# Patient Record
Sex: Male | Born: 1935
Health system: Southern US, Community
[De-identification: ages and names within clinical notes are randomized; demographics above are authoritative.]

## PROBLEM LIST (undated history)

## (undated) DIAGNOSIS — N400 Enlarged prostate without lower urinary tract symptoms: Secondary | ICD-10-CM

## (undated) DIAGNOSIS — J00 Acute nasopharyngitis [common cold]: Secondary | ICD-10-CM

## (undated) DIAGNOSIS — I10 Essential (primary) hypertension: Secondary | ICD-10-CM

## (undated) DIAGNOSIS — M1990 Unspecified osteoarthritis, unspecified site: Secondary | ICD-10-CM

## (undated) HISTORY — PX: OTHER SURGICAL HISTORY: SHX169

---

## 1997-08-16 ENCOUNTER — Ambulatory Visit (HOSPITAL_COMMUNITY): Admission: RE | Admit: 1997-08-16 | Discharge: 1997-08-16 | Payer: Self-pay | Admitting: Family Medicine

## 2001-05-01 ENCOUNTER — Encounter: Admission: RE | Admit: 2001-05-01 | Discharge: 2001-05-01 | Payer: Self-pay | Admitting: Family Medicine

## 2001-05-01 ENCOUNTER — Encounter: Payer: Self-pay | Admitting: Family Medicine

## 2001-11-14 ENCOUNTER — Emergency Department (HOSPITAL_COMMUNITY): Admission: EM | Admit: 2001-11-14 | Discharge: 2001-11-14 | Payer: Self-pay | Admitting: Emergency Medicine

## 2003-03-14 ENCOUNTER — Ambulatory Visit (HOSPITAL_COMMUNITY): Admission: RE | Admit: 2003-03-14 | Discharge: 2003-03-14 | Payer: Self-pay | Admitting: Family Medicine

## 2003-03-18 ENCOUNTER — Ambulatory Visit (HOSPITAL_COMMUNITY): Admission: RE | Admit: 2003-03-18 | Discharge: 2003-03-18 | Payer: Self-pay | Admitting: Family Medicine

## 2003-07-05 ENCOUNTER — Encounter: Admission: RE | Admit: 2003-07-05 | Discharge: 2003-07-05 | Payer: Self-pay | Admitting: Family Medicine

## 2004-11-26 ENCOUNTER — Ambulatory Visit: Payer: Self-pay | Admitting: Gastroenterology

## 2004-12-07 ENCOUNTER — Ambulatory Visit: Payer: Self-pay | Admitting: Gastroenterology

## 2008-05-16 ENCOUNTER — Emergency Department (HOSPITAL_COMMUNITY): Admission: EM | Admit: 2008-05-16 | Discharge: 2008-05-16 | Payer: Self-pay | Admitting: Emergency Medicine

## 2008-05-17 ENCOUNTER — Ambulatory Visit (HOSPITAL_COMMUNITY): Admission: RE | Admit: 2008-05-17 | Discharge: 2008-05-17 | Payer: Self-pay | Admitting: Family Medicine

## 2009-05-04 ENCOUNTER — Ambulatory Visit (HOSPITAL_COMMUNITY): Admission: RE | Admit: 2009-05-04 | Discharge: 2009-05-04 | Payer: Self-pay | Admitting: Family Medicine

## 2009-06-21 ENCOUNTER — Emergency Department (HOSPITAL_COMMUNITY): Admission: EM | Admit: 2009-06-21 | Discharge: 2009-06-21 | Payer: Self-pay | Admitting: Emergency Medicine

## 2009-10-24 ENCOUNTER — Encounter (INDEPENDENT_AMBULATORY_CARE_PROVIDER_SITE_OTHER): Payer: Self-pay | Admitting: *Deleted

## 2009-12-24 ENCOUNTER — Emergency Department (HOSPITAL_COMMUNITY): Admission: EM | Admit: 2009-12-24 | Discharge: 2009-12-24 | Payer: Self-pay | Admitting: Emergency Medicine

## 2010-05-17 NOTE — Letter (Signed)
Summary: Colonoscopy-Changed to Office Visit Letter  [facility]  [address] [place], [place] [address]   Phone: [phone]  Fax: [phone]      October 24, 2009 MRN: [id number]   [facility] [address] [place], [place]  [address]   Dear Mr. Zawistowski,   According to our records, it is time for you to schedule a Colonoscopy. However, after reviewing your medical record, I feel that an office visit in the month of Sept 2011, would be most appropriate to more completely evaluate you and determine your need for a repeat procedure.  Please call [phone] (option #2) at your convenience to schedule an office visit. If you have any questions, concerns, or feel that this letter is in error, we would appreciate your call.   Sincerely,  Barbette Hair. Arlyce Dice, M.D.  [facility] [phone]

## 2010-06-28 LAB — URINALYSIS, ROUTINE W REFLEX MICROSCOPIC
Bilirubin Urine: NEGATIVE
Ketones, ur: NEGATIVE mg/dL
Nitrite: NEGATIVE
Protein, ur: NEGATIVE mg/dL
Specific Gravity, Urine: 1.009 (ref 1.005–1.030)
Urobilinogen, UA: 0.2 mg/dL (ref 0.0–1.0)

## 2010-06-28 LAB — POCT I-STAT, CHEM 8
BUN: 12 mg/dL (ref 6–23)
Calcium, Ion: 1.09 mmol/L — ABNORMAL LOW (ref 1.12–1.32)
Chloride: 103 mEq/L (ref 96–112)
Creatinine, Ser: 0.7 mg/dL (ref 0.4–1.5)
Glucose, Bld: 156 mg/dL — ABNORMAL HIGH (ref 70–99)
Potassium: 4.1 mEq/L (ref 3.5–5.1)

## 2010-06-28 LAB — URINE MICROSCOPIC-ADD ON

## 2010-07-01 LAB — RENAL FUNCTION PANEL
BUN: 11 mg/dL (ref 6–23)
Chloride: 104 mEq/L (ref 96–112)
Glucose, Bld: 119 mg/dL — ABNORMAL HIGH (ref 70–99)
Phosphorus: 2.9 mg/dL (ref 2.3–4.6)
Potassium: 3.6 mEq/L (ref 3.5–5.1)
Sodium: 139 mEq/L (ref 135–145)

## 2010-07-09 ENCOUNTER — Emergency Department (HOSPITAL_COMMUNITY)
Admission: EM | Admit: 2010-07-09 | Discharge: 2010-07-09 | Disposition: A | Discharge status: Home / Self Care | Payer: 59 | Attending: Emergency Medicine | Admitting: Emergency Medicine

## 2010-07-09 DIAGNOSIS — E785 Hyperlipidemia, unspecified: Secondary | ICD-10-CM | POA: Insufficient documentation

## 2010-07-09 DIAGNOSIS — I10 Essential (primary) hypertension: Secondary | ICD-10-CM | POA: Insufficient documentation

## 2010-07-09 DIAGNOSIS — K5900 Constipation, unspecified: Secondary | ICD-10-CM | POA: Insufficient documentation

## 2010-07-09 LAB — URINE CULTURE

## 2010-07-09 LAB — POCT I-STAT, CHEM 8
BUN: 17 mg/dL (ref 6–23)
Chloride: 102 mEq/L (ref 96–112)
Creatinine, Ser: 0.7 mg/dL (ref 0.4–1.5)
Sodium: 140 mEq/L (ref 135–145)

## 2010-07-09 LAB — URINE MICROSCOPIC-ADD ON

## 2010-07-09 LAB — URINALYSIS, ROUTINE W REFLEX MICROSCOPIC
Glucose, UA: NEGATIVE mg/dL
Leukocytes, UA: NEGATIVE
Protein, ur: NEGATIVE mg/dL
Specific Gravity, Urine: 1.009 (ref 1.005–1.030)
Urobilinogen, UA: 0.2 mg/dL (ref 0.0–1.0)

## 2010-08-20 ENCOUNTER — Emergency Department (HOSPITAL_COMMUNITY): Payer: 59

## 2010-08-20 ENCOUNTER — Emergency Department (HOSPITAL_COMMUNITY)
Admission: EM | Admit: 2010-08-20 | Discharge: 2010-08-20 | Disposition: A | Discharge status: Home / Self Care | Payer: 59 | Attending: Emergency Medicine | Admitting: Emergency Medicine

## 2010-08-20 DIAGNOSIS — S62309A Unspecified fracture of unspecified metacarpal bone, initial encounter for closed fracture: Secondary | ICD-10-CM | POA: Insufficient documentation

## 2010-08-20 DIAGNOSIS — W2203XA Walked into furniture, initial encounter: Secondary | ICD-10-CM | POA: Insufficient documentation

## 2010-08-20 DIAGNOSIS — I10 Essential (primary) hypertension: Secondary | ICD-10-CM | POA: Insufficient documentation

## 2010-08-20 DIAGNOSIS — E785 Hyperlipidemia, unspecified: Secondary | ICD-10-CM | POA: Insufficient documentation

## 2010-08-20 DIAGNOSIS — M109 Gout, unspecified: Secondary | ICD-10-CM | POA: Insufficient documentation

## 2011-07-26 ENCOUNTER — Other Ambulatory Visit: Payer: Self-pay | Admitting: Family Medicine

## 2011-07-26 ENCOUNTER — Ambulatory Visit
Admission: RE | Admit: 2011-07-26 | Discharge: 2011-07-26 | Disposition: A | Discharge status: Home / Self Care | Payer: 59 | Source: Ambulatory Visit | Attending: Family Medicine | Admitting: Family Medicine

## 2011-08-23 ENCOUNTER — Other Ambulatory Visit: Payer: Self-pay | Admitting: Family Medicine

## 2011-08-23 ENCOUNTER — Ambulatory Visit
Admission: RE | Admit: 2011-08-23 | Discharge: 2011-08-23 | Disposition: A | Discharge status: Home / Self Care | Payer: 59 | Source: Ambulatory Visit | Attending: Family Medicine | Admitting: Family Medicine

## 2011-08-23 DIAGNOSIS — J209 Acute bronchitis, unspecified: Secondary | ICD-10-CM

## 2011-11-09 ENCOUNTER — Encounter (HOSPITAL_COMMUNITY): Payer: Self-pay | Admitting: Emergency Medicine

## 2011-11-09 ENCOUNTER — Emergency Department (HOSPITAL_COMMUNITY)
Admission: EM | Admit: 2011-11-09 | Discharge: 2011-11-09 | Disposition: A | Discharge status: Home / Self Care | Payer: 59 | Attending: Emergency Medicine | Admitting: Emergency Medicine

## 2011-11-09 DIAGNOSIS — R339 Retention of urine, unspecified: Secondary | ICD-10-CM | POA: Insufficient documentation

## 2011-11-09 DIAGNOSIS — I10 Essential (primary) hypertension: Secondary | ICD-10-CM | POA: Insufficient documentation

## 2011-11-09 HISTORY — DX: Benign prostatic hyperplasia without lower urinary tract symptoms: N40.0

## 2011-11-09 HISTORY — DX: Essential (primary) hypertension: I10

## 2011-11-09 LAB — URINALYSIS, ROUTINE W REFLEX MICROSCOPIC
Glucose, UA: NEGATIVE mg/dL
Leukocytes, UA: NEGATIVE
pH: 6 (ref 5.0–8.0)

## 2011-11-09 NOTE — ED Provider Notes (Signed)
Medical screening examination/treatment/procedure(s) were conducted as a shared visit with non-physician practitioner(s) and myself.  I personally evaluated the patient during the encounter   Cyndra Numbers, MD 11/09/11 1115

## 2011-11-09 NOTE — ED Provider Notes (Signed)
History     CSN: [id number]  Arrival date & time 11/09/11  0730   First MD Initiated Contact with Patient 11/09/11 [phone]      Chief Complaint  Patient presents with  . Urinary Retention    (Consider location/radiation/quality/duration/timing/severity/associated sxs/prior treatment) HPI Comments: Patient with enlarged prostate reports he was feeling fine until this morning when he woke up and "couldn't make water."  States he has been having stinging and burning with urination off and on for the past few weeks.  Denies fevers, abdominal pain, back pain, N/V/D.  Pt sees Dr Brunilda Payor for his prostate.   States that since foley has been placed in ED he feels "like new" again.  Denies any discomfort.    The history is provided by the patient.    Past Medical History  Diagnosis Date  . Enlarged prostate   . Hypertension     History reviewed. No pertinent past surgical history.  History reviewed. No pertinent family history.  History  Substance Use Topics  . Smoking status: Never Smoker   . Smokeless tobacco: Not on file  . Alcohol Use: Yes      Review of Systems  Constitutional: Negative for fever and chills.  Respiratory: Negative for cough and shortness of breath.   Cardiovascular: Negative for chest pain.  Gastrointestinal: Negative for nausea, vomiting, abdominal pain and diarrhea.  Genitourinary: Positive for dysuria.    Allergies  Review of patient's allergies indicates no known allergies.  Home Medications  No current outpatient prescriptions on file.  BP 204/62  Pulse 54  Temp 98.6 F (37 C) (Oral)  Resp 18  SpO2 100%  Physical Exam  Nursing note and vitals reviewed. Constitutional: He appears well-developed and well-nourished. No distress.  HENT:  Head: Normocephalic and atraumatic.  Neck: Neck supple.  Cardiovascular: Normal rate and regular rhythm.   Pulmonary/Chest: Effort normal and breath sounds normal. No respiratory distress. He has no wheezes.  He has no rales.  Abdominal: Soft. He exhibits no distension and no mass. There is no tenderness. There is no rebound and no guarding.  Neurological: He is alert. He exhibits normal muscle tone.  Skin: He is not diaphoretic.  Foley bag in place, has drained 350cc  ED Course  Procedures (including critical care time)  Labs Reviewed  URINALYSIS, ROUTINE W REFLEX MICROSCOPIC - Abnormal; Notable for the following:    APPearance CLOUDY (*)     Hgb urine dipstick MODERATE (*)     Protein, ur 30 (*)     All other components within normal limits  URINE MICROSCOPIC-ADD ON  URINE CULTURE   No results found.  8:28 AM Discussed patient with Dr Alto Denver who will also see the patient.    1. Urinary retention       MDM  Elderly patient with hx prostatic hypertrophy presenting with urinary retention, relieved with foley catheter.  Pt has had this before, sees Dr Brunilda Payor for his prostate.  No e/o UTI on UA, sent for culture.  Pt advised to follow closely with Dr Brunilda Payor.  Will be d/c home with foley catheter in place.  Pt is already on flomax.  Discussed all results with patient.  Pt given return precautions.  Pt verbalizes understanding and agrees with plan.           [place], [place] 11/09/11 [phone]

## 2011-11-09 NOTE — ED Notes (Signed)
Pt c/o urinary retention, states he was able to void a little this am but was unable to empty his bladder.

## 2011-11-10 LAB — URINE CULTURE
Colony Count: NO GROWTH
Culture: NO GROWTH

## 2011-12-08 ENCOUNTER — Encounter (HOSPITAL_COMMUNITY): Payer: Self-pay | Admitting: *Deleted

## 2011-12-08 ENCOUNTER — Emergency Department (HOSPITAL_COMMUNITY)
Admission: EM | Admit: 2011-12-08 | Discharge: 2011-12-08 | Disposition: A | Discharge status: Home / Self Care | Payer: 59 | Attending: Emergency Medicine | Admitting: Emergency Medicine

## 2011-12-08 DIAGNOSIS — R339 Retention of urine, unspecified: Secondary | ICD-10-CM | POA: Insufficient documentation

## 2011-12-08 DIAGNOSIS — I10 Essential (primary) hypertension: Secondary | ICD-10-CM | POA: Insufficient documentation

## 2011-12-08 DIAGNOSIS — N390 Urinary tract infection, site not specified: Secondary | ICD-10-CM

## 2011-12-08 LAB — URINALYSIS, ROUTINE W REFLEX MICROSCOPIC
Bilirubin Urine: NEGATIVE
Glucose, UA: NEGATIVE mg/dL
Nitrite: POSITIVE — AB
Specific Gravity, Urine: 1.015 (ref 1.005–1.030)
pH: 6.5 (ref 5.0–8.0)

## 2011-12-08 LAB — URINE MICROSCOPIC-ADD ON

## 2011-12-08 MED ORDER — SULFAMETHOXAZOLE-TMP DS 800-160 MG PO TABS
1.0000 | ORAL_TABLET | Freq: Once | ORAL | Status: AC
  Administered 2011-12-08: 1 via ORAL
  Filled 2011-12-08: qty 1

## 2011-12-08 MED ORDER — SULFAMETHOXAZOLE-TRIMETHOPRIM 800-160 MG PO TABS: 1.0000 | ORAL_TABLET | Freq: Two times a day (BID) | ORAL | Status: AC

## 2011-12-08 NOTE — ED Notes (Signed)
Pt reports abdominal pain. Reports he is unable to urinate.

## 2011-12-08 NOTE — ED Notes (Signed)
Leg bag attached prior to Dc. Bag intact. Instructed pt on how to care for bag. Pt reports having had bag before and knows how to care for.

## 2011-12-08 NOTE — ED Notes (Signed)
Patient discharged to home. Dc instructions given with prescription x 1 for antibiotic. No concerns voiced. Pt voices "feeling better already" prior to leaving. Left unit ambulating to checkout. Left in good condition.

## 2011-12-08 NOTE — ED Provider Notes (Signed)
History     CSN: [id number]  Arrival date & time 12/08/11  4098   First MD Initiated Contact with Patient 12/08/11 [phone]      Chief Complaint  Patient presents with  . Urinary Retention    (Consider location/radiation/quality/duration/timing/severity/associated sxs/prior treatment) HPI Comments: Mr. Todorov states that he has not been able to empty his bladder since yesterday morning.  He c/o "dribbling" urine and feeling pressure and distention of his bladder.  He denies any fevers, chills, dysuria, hematuria, or pyuria.  He denies any trauma or hx of prostate cancer.  He has been recently treated for urinary retention with a foley catheter that was removed by his primary physician.  The history is provided by the patient. No language interpreter was used.    Past Medical History  Diagnosis Date  . Enlarged prostate   . Hypertension     History reviewed. No pertinent past surgical history.  History reviewed. No pertinent family history.  History  Substance Use Topics  . Smoking status: Never Smoker   . Smokeless tobacco: Not on file  . Alcohol Use: Yes      Review of Systems  Constitutional: Negative for fever, chills, diaphoresis and fatigue.  HENT: Negative.   Eyes: Negative.   Respiratory: Negative.   Cardiovascular: Negative.   Gastrointestinal: Positive for abdominal distention. Negative for nausea, vomiting, diarrhea and constipation.  Genitourinary: Positive for urgency, frequency and difficulty urinating. Negative for dysuria, hematuria, flank pain, penile swelling, enuresis and penile pain.  Musculoskeletal: Negative.   Skin: Negative.   Neurological: Negative.   Psychiatric/Behavioral: Negative.     Allergies  Review of patient's allergies indicates no known allergies.  Home Medications   Current Outpatient Rx  Name Route Sig Dispense Refill  . METOPROLOL SUCCINATE ER 50 MG PO TB24 Oral Take 50 mg by mouth daily. Take with or immediately following a  meal.    . ROSUVASTATIN CALCIUM 20 MG PO TABS Oral Take 20 mg by mouth daily.    Marland Kitchen TAMSULOSIN HCL 0.4 MG PO CAPS Oral Take 0.4 mg by mouth daily.    Marland Kitchen VALSARTAN 160 MG PO TABS Oral Take 160 mg by mouth daily.      BP 131/71  Pulse 53  Temp 98.2 F (36.8 C) (Oral)  Resp 30  Ht 5\' 5"  (1.651 m)  Wt 160 lb (72.576 kg)  BMI 26.63 kg/m2  SpO2 100%  Physical Exam  Nursing note and vitals reviewed. Constitutional: He is oriented to person, place, and time. He appears well-nourished. No distress.  HENT:  Head: Normocephalic and atraumatic.  Right Ear: External ear normal.  Left Ear: External ear normal.  Nose: Nose normal.  Mouth/Throat: Oropharynx is clear and moist. No oropharyngeal exudate.  Eyes: Conjunctivae and EOM are normal. Pupils are equal, round, and reactive to light. Left eye exhibits no discharge. No scleral icterus.  Neck: Normal range of motion. Neck supple. No JVD present. No tracheal deviation present. No thyromegaly present.  Cardiovascular: Normal rate, regular rhythm and intact distal pulses.  Exam reveals no gallop and no friction rub.   Murmur heard. Pulmonary/Chest: Effort normal and breath sounds normal. No respiratory distress. He has no wheezes. He has no rales. He exhibits no tenderness.  Abdominal: Soft. Bowel sounds are normal. He exhibits distension. He exhibits no mass. There is tenderness. There is no rebound and no guarding.  Musculoskeletal: Normal range of motion. He exhibits no edema.  Lymphadenopathy:    He has no cervical adenopathy.  Neurological: He is alert and oriented to person, place, and time.  Skin: Skin is warm and dry. No rash noted. He is not diaphoretic. No erythema. No pallor.    ED Course  Procedures (including critical care time)  Labs Reviewed - No data to display No results found.   No diagnosis found.    MDM  Pt presents for evaluation of urinary retention.  He states he was seen for the same condition about 2 wks ago.   Denies any other issues.  Plan place foley and obtain U/A.  Will refer back to his PMD as well as to a local urologist if no other acute issues encountered.  1100.  Pt appears comfortable s/p placement of folley.  Not evidence of a UTI on the U/A.  Urine culture pending.  Plan start bactrim, place a leg bag.  Will D/C home to f/u with his PMD and will refer to a urologist.      Tobin Chad, MD 12/08/11 1110

## 2011-12-10 LAB — URINE CULTURE: Colony Count: >=100000

## 2011-12-11 NOTE — ED Notes (Signed)
+   urine  Patient treated appropriately -sensitive to same-chart appended per protocol MD.  

## 2011-12-17 ENCOUNTER — Encounter: Payer: Self-pay | Admitting: Gastroenterology

## 2012-01-02 ENCOUNTER — Encounter (HOSPITAL_COMMUNITY): Payer: Self-pay | Admitting: *Deleted

## 2012-01-02 ENCOUNTER — Emergency Department (HOSPITAL_COMMUNITY)
Admission: EM | Admit: 2012-01-02 | Discharge: 2012-01-02 | Disposition: A | Discharge status: Home / Self Care | Payer: 59 | Attending: Neurological Surgery | Admitting: Neurological Surgery

## 2012-01-02 DIAGNOSIS — T839XXA Unspecified complication of genitourinary prosthetic device, implant and graft, initial encounter: Secondary | ICD-10-CM

## 2012-01-02 DIAGNOSIS — T8389XA Other specified complication of genitourinary prosthetic devices, implants and grafts, initial encounter: Secondary | ICD-10-CM | POA: Insufficient documentation

## 2012-01-02 DIAGNOSIS — Y846 Urinary catheterization as the cause of abnormal reaction of the patient, or of later complication, without mention of misadventure at the time of the procedure: Secondary | ICD-10-CM | POA: Insufficient documentation

## 2012-01-02 NOTE — ED Notes (Signed)
Pt reported catheter bag was changed in triage. Catheter bag intact, new on assessment. No leaking. Pt ambulated to bathroom to have BM.

## 2012-01-02 NOTE — ED Provider Notes (Signed)
Medical screening examination/treatment/procedure(s) were performed by non-physician practitioner and as supervising physician I was immediately available for consultation/collaboration.  Tobin Chad, MD 01/02/12 [phone]

## 2012-01-02 NOTE — ED Provider Notes (Signed)
History     CSN: [id number]  Arrival date & time 01/02/12  0620   First MD Initiated Contact with Patient 01/02/12 [phone]      Chief Complaint  Patient presents with  . catheter bag leaking     (Consider location/radiation/quality/duration/timing/severity/associated sxs/prior treatment) HPI  78 y.o. y/o male INAD c/o leaking foley cath leg bag onset this AM. Pt denies any other  Symptoms or pain.   Past Medical History  Diagnosis Date  . Enlarged prostate   . Hypertension     History reviewed. No pertinent past surgical history.  No family history on file.  History  Substance Use Topics  . Smoking status: Never Smoker   . Smokeless tobacco: Not on file  . Alcohol Use: Yes      Review of Systems  Constitutional: Negative for fever.  Respiratory: Negative for shortness of breath.   Cardiovascular: Negative for chest pain.  Gastrointestinal: Negative for nausea, vomiting, abdominal pain and diarrhea.  Genitourinary: Negative for hematuria, scrotal swelling and testicular pain.  All other systems reviewed and are negative.    Allergies  Review of patient's allergies indicates no known allergies.  Home Medications   Current Outpatient Rx  Name Route Sig Dispense Refill  . METOPROLOL SUCCINATE ER 50 MG PO TB24 Oral Take 50 mg by mouth daily. Take with or immediately following a meal.    . ROSUVASTATIN CALCIUM 20 MG PO TABS Oral Take 20 mg by mouth daily.    Marland Kitchen TAMSULOSIN HCL 0.4 MG PO CAPS Oral Take 0.4 mg by mouth daily.    Marland Kitchen VALSARTAN 160 MG PO TABS Oral Take 160 mg by mouth daily.      BP 209/70  Pulse 62  Temp 98.1 F (36.7 C) (Oral)  Resp 18  SpO2 100%  Physical Exam  Nursing note and vitals reviewed. Constitutional: He is oriented to person, place, and time. He appears well-developed and well-nourished. No distress.  HENT:  Head: Normocephalic.  Eyes: Conjunctivae normal and EOM are normal.  Cardiovascular: Normal rate.   Pulmonary/Chest: Effort  normal. No stridor.  Abdominal: Soft.  Musculoskeletal: Normal range of motion.  Neurological: He is alert and oriented to person, place, and time.  Psychiatric: He has a normal mood and affect.    ED Course  Procedures (including critical care time)  Labs Reviewed - No data to display No results found.   1. Foley catheter problem       MDM  Leg bag changed, Pt will follow with urology        Wynetta Emery, PA-C 01/02/12 [phone]

## 2012-01-02 NOTE — ED Notes (Signed)
Pt urinary leg bag leaking

## 2012-01-11 ENCOUNTER — Encounter (HOSPITAL_COMMUNITY): Payer: Self-pay | Admitting: *Deleted

## 2012-01-11 ENCOUNTER — Emergency Department (HOSPITAL_COMMUNITY)
Admission: EM | Admit: 2012-01-11 | Discharge: 2012-01-11 | Disposition: A | Discharge status: Home / Self Care | Payer: 59 | Attending: Emergency Medicine | Admitting: Emergency Medicine

## 2012-01-11 DIAGNOSIS — I10 Essential (primary) hypertension: Secondary | ICD-10-CM | POA: Insufficient documentation

## 2012-01-11 DIAGNOSIS — N390 Urinary tract infection, site not specified: Secondary | ICD-10-CM

## 2012-01-11 DIAGNOSIS — T8389XA Other specified complication of genitourinary prosthetic devices, implants and grafts, initial encounter: Secondary | ICD-10-CM | POA: Insufficient documentation

## 2012-01-11 DIAGNOSIS — Y846 Urinary catheterization as the cause of abnormal reaction of the patient, or of later complication, without mention of misadventure at the time of the procedure: Secondary | ICD-10-CM | POA: Insufficient documentation

## 2012-01-11 LAB — URINALYSIS, ROUTINE W REFLEX MICROSCOPIC
Bilirubin Urine: NEGATIVE
Ketones, ur: NEGATIVE mg/dL
Nitrite: POSITIVE — AB
Protein, ur: 30 mg/dL — AB
pH: 7 (ref 5.0–8.0)

## 2012-01-11 LAB — URINE MICROSCOPIC-ADD ON

## 2012-01-11 MED ORDER — CIPROFLOXACIN HCL 500 MG PO TABS
500.0000 mg | ORAL_TABLET | Freq: Once | ORAL | Status: AC
  Administered 2012-01-11: 500 mg via ORAL
  Filled 2012-01-11: qty 1

## 2012-01-11 MED ORDER — METOPROLOL TARTRATE 25 MG PO TABS
50.0000 mg | ORAL_TABLET | Freq: Once | ORAL | Status: AC
  Administered 2012-01-11: 50 mg via ORAL
  Filled 2012-01-11: qty 2

## 2012-01-11 MED ORDER — CIPROFLOXACIN HCL 500 MG PO TABS: 500.0000 mg | ORAL_TABLET | Freq: Two times a day (BID) | ORAL | Status: DC

## 2012-01-11 NOTE — ED Notes (Signed)
Pt sts he has catheter placed 3 weeks ago due to enlarged prostate. Sts it began leaking around noon today. Strong urine odor noted. Sts he has appointment to get catheter changed next week. Sts he believes it is leaking from the bottom.

## 2012-01-11 NOTE — ED Notes (Signed)
Patient given discharge instructions, information, prescriptions, and diet order. Patient states that they adequately understand discharge information given and to return to ED if symptoms return or worsen.     

## 2012-01-11 NOTE — ED Provider Notes (Signed)
History     CSN: [id number]  Arrival date & time 01/11/12  1746   First MD Initiated Contact with Patient 01/11/12 1905      Chief Complaint  Patient presents with  . Male GU Problem    HPI The patient presents with concerns of leakage around the bottom of his Foley catheter bag.  Symptoms began approximately 6 hours ago.  Since onset symptoms have been persistent.  He denies associated abdominal pain, dysuria, fevers, chills or any other focal complaints.  The patient has a Foley catheter due to prostate hypertrophy.  Past Medical History  Diagnosis Date  . Enlarged prostate   . Hypertension     History reviewed. No pertinent past surgical history.  No family history on file.  History  Substance Use Topics  . Smoking status: Never Smoker   . Smokeless tobacco: Not on file  . Alcohol Use: Yes      Review of Systems  All other systems reviewed and are negative.    Allergies  Review of patient's allergies indicates no known allergies.  Home Medications   Current Outpatient Rx  Name Route Sig Dispense Refill  . METOPROLOL SUCCINATE ER 50 MG PO TB24 Oral Take 50 mg by mouth daily. Take with or immediately following a meal.    . ROSUVASTATIN CALCIUM 20 MG PO TABS Oral Take 20 mg by mouth daily.    Marland Kitchen TAMSULOSIN HCL 0.4 MG PO CAPS Oral Take 0.4 mg by mouth daily.    Marland Kitchen VALSARTAN 160 MG PO TABS Oral Take 160 mg by mouth daily.      BP 204/63  Pulse 64  Temp 98.2 F (36.8 C) (Oral)  Resp 18  Ht 5\' 5"  (1.651 m)  Wt 160 lb (72.576 kg)  BMI 26.63 kg/m2  SpO2 100%  Physical Exam  Nursing note and vitals reviewed. Constitutional: He is oriented to person, place, and time. He appears well-developed. No distress.       Foul-smelling urine present  HENT:  Head: Normocephalic and atraumatic.  Eyes: Conjunctivae normal and EOM are normal.  Cardiovascular: Normal rate and regular rhythm.   Pulmonary/Chest: Effort normal. No stridor. No respiratory distress.    Abdominal: He exhibits no distension.  Genitourinary:       Uncircumcised, normal male genitalia with no scrotal swelling or pain.  About the proximal catheter there is no surrounding leakage.  There is leakage at the distal end of the catheter bag.  Musculoskeletal: He exhibits no edema.  Neurological: He is alert and oriented to person, place, and time.  Skin: Skin is warm and dry.  Psychiatric: He has a normal mood and affect.    ED Course  Procedures (including critical care time)   Labs Reviewed  URINALYSIS, ROUTINE W REFLEX MICROSCOPIC   No results found.   No diagnosis found.    MDM  This pleasant male presents with concerns of Foley catheter drainage issues.  On exam the patient denies any other associated complaints, but has malodorous urine present.  The patient is also hypertensive.  Given the patient denies any medication use today, he was provided medication, with a notable decrease in his blood pressure.  Urinalysis performed, concerning for urinary tract infection.  The patient was started on antibiotics.  Given his close followup, he was discharged in stable condition to see his urologist    Gerhard Munch, MD 01/11/12 2158

## 2012-01-11 NOTE — ED Notes (Signed)
Bed:WA21<BR> Expected date:<BR> Expected time:<BR> Means of arrival:<BR> Comments:<BR>

## 2012-02-05 ENCOUNTER — Emergency Department (HOSPITAL_COMMUNITY)
Admission: EM | Admit: 2012-02-05 | Discharge: 2012-02-05 | Disposition: A | Discharge status: Home / Self Care | Payer: 59 | Attending: Emergency Medicine | Admitting: Emergency Medicine

## 2012-02-05 ENCOUNTER — Encounter (HOSPITAL_COMMUNITY): Payer: Self-pay | Admitting: Emergency Medicine

## 2012-02-05 DIAGNOSIS — Y838 Other surgical procedures as the cause of abnormal reaction of the patient, or of later complication, without mention of misadventure at the time of the procedure: Secondary | ICD-10-CM | POA: Insufficient documentation

## 2012-02-05 DIAGNOSIS — T839XXA Unspecified complication of genitourinary prosthetic device, implant and graft, initial encounter: Secondary | ICD-10-CM

## 2012-02-05 DIAGNOSIS — I10 Essential (primary) hypertension: Secondary | ICD-10-CM | POA: Insufficient documentation

## 2012-02-05 DIAGNOSIS — T83091A Other mechanical complication of indwelling urethral catheter, initial encounter: Secondary | ICD-10-CM | POA: Insufficient documentation

## 2012-02-05 DIAGNOSIS — Z79899 Other long term (current) drug therapy: Secondary | ICD-10-CM | POA: Insufficient documentation

## 2012-02-05 NOTE — ED Provider Notes (Signed)
History     CSN: [id number]  Arrival date & time 02/05/12  0522   First MD Initiated Contact with Patient 02/05/12 0602      Chief Complaint  Patient presents with  . catheter bag leakage     Pt c/o urinary catheter leaking    (Consider location/radiation/quality/duration/timing/severity/associated sxs/prior treatment) HPI  78 year old male with history of enlarged prostate presents due to leakage from his Foley bag.  Pt wears leg bag. He request for new bag.  Denies any other sxs specifically no fever, abd pain, hematuria or rash.  Will f/u with Dr. Brunilda Payor towards the end of the month.     Past Medical History  Diagnosis Date  . Enlarged prostate   . Hypertension     History reviewed. No pertinent past surgical history.  History reviewed. No pertinent family history.  History  Substance Use Topics  . Smoking status: Never Smoker   . Smokeless tobacco: Not on file  . Alcohol Use: Yes      Review of Systems  Constitutional: Negative for fever.  Gastrointestinal: Negative for abdominal pain.  Skin: Negative for rash and wound.    Allergies  Review of patient's allergies indicates no known allergies.  Home Medications   Current Outpatient Rx  Name Route Sig Dispense Refill  . METOPROLOL SUCCINATE ER 50 MG PO TB24 Oral Take 50 mg by mouth daily. Take with or immediately following a meal.    . ROSUVASTATIN CALCIUM 20 MG PO TABS Oral Take 20 mg by mouth daily.    Marland Kitchen TAMSULOSIN HCL 0.4 MG PO CAPS Oral Take 0.4 mg by mouth daily.    Marland Kitchen VALSARTAN 160 MG PO TABS Oral Take 160 mg by mouth daily.      BP 190/92  Pulse 60  Temp 98.3 F (36.8 C) (Oral)  Resp 16  SpO2 100%  Physical Exam  Nursing note and vitals reviewed. Constitutional: He appears well-developed and well-nourished. No distress.  HENT:  Head: Atraumatic.  Eyes: Conjunctivae normal are normal.  Neck: Neck supple.  Abdominal: Soft. There is no tenderness.  Genitourinary: Penis normal.   Indwelling foley in place.  Neurological: He is alert.  Skin: Skin is warm. No rash noted.  Psychiatric: He has a normal mood and affect.    ED Course  Procedures (including critical care time)  Labs Reviewed - No data to display No results found.   No diagnosis found.  1.  Complication of Foley Catheter device  MDM  Foley bag were changed, education provided.  Pt to f/u with his urologist next week.    BP 190/92  Pulse 60  Temp 98.3 F (36.8 C) (Oral)  Resp 16  SpO2 100%         Fayrene Helper, PA-C 02/05/12 [phone]

## 2012-02-05 NOTE — ED Notes (Signed)
Pt given new leg bag, educated on use and attached at this time.

## 2012-02-05 NOTE — ED Notes (Signed)
Pt states he needs a new leg bag, because his current bag is leaking and his next Dr. appointment is at the end of the month.

## 2012-02-05 NOTE — ED Provider Notes (Signed)
Medical screening examination/treatment/procedure(s) were performed by non-physician practitioner and as supervising physician I was immediately available for consultation/collaboration.  Olivia Mackie, MD 02/05/12 [phone]

## 2012-03-24 ENCOUNTER — Other Ambulatory Visit: Payer: Self-pay | Admitting: Urology

## 2012-04-10 ENCOUNTER — Encounter (HOSPITAL_COMMUNITY): Payer: Self-pay | Admitting: Pharmacy Technician

## 2012-04-14 ENCOUNTER — Encounter (HOSPITAL_COMMUNITY): Payer: Self-pay

## 2012-04-14 ENCOUNTER — Encounter (HOSPITAL_COMMUNITY)
Admission: RE | Admit: 2012-04-14 | Discharge: 2012-04-14 | Disposition: A | Discharge status: Home / Self Care | Payer: 59 | Source: Ambulatory Visit | Attending: Urology | Admitting: Urology

## 2012-04-14 HISTORY — DX: Benign prostatic hyperplasia without lower urinary tract symptoms: N40.0

## 2012-04-14 HISTORY — DX: Acute nasopharyngitis (common cold): J00

## 2012-04-14 HISTORY — DX: Unspecified osteoarthritis, unspecified site: M19.90

## 2012-04-14 LAB — COMPREHENSIVE METABOLIC PANEL
Alkaline Phosphatase: 61 U/L (ref 39–117)
BUN: 14 mg/dL (ref 6–23)
Calcium: 9.2 mg/dL (ref 8.4–10.5)
Creatinine, Ser: 0.66 mg/dL (ref 0.50–1.35)
GFR calc Af Amer: >90 mL/min (ref >90)
Glucose, Bld: 100 mg/dL — ABNORMAL HIGH (ref 70–99)
Potassium: 4.3 mEq/L (ref 3.5–5.1)
Total Protein: 7.7 g/dL (ref 6.0–8.3)

## 2012-04-14 LAB — URINALYSIS, ROUTINE W REFLEX MICROSCOPIC
Protein, ur: 100 mg/dL — AB
Urobilinogen, UA: 0.2 mg/dL (ref 0.0–1.0)

## 2012-04-14 LAB — CBC
Hemoglobin: 13.8 g/dL (ref 13.0–17.0)
MCV: 85.4 fL (ref 78.0–100.0)
Platelets: 233 10*3/uL (ref 150–400)
RBC: 4.93 MIL/uL (ref 4.22–5.81)
WBC: 5.9 10*3/uL (ref 4.0–10.5)

## 2012-04-14 LAB — SURGICAL PCR SCREEN: Staphylococcus aureus: NEGATIVE

## 2012-04-14 LAB — URINE MICROSCOPIC-ADD ON

## 2012-04-14 LAB — PROTIME-INR: Prothrombin Time: 13.7 seconds (ref 11.6–15.2)

## 2012-04-14 NOTE — Patient Instructions (Signed)
YOUR SURGERY IS SCHEDULED AT [facility]  ON:  Friday   January 3rd  REPORT TO [facility]AY [facility] AT: 8:30 AM      PHONE # FOR SHORT STAY IS [phone]   FOLLOW YOUR CLEAR LIQUID DIET AND LAXATIVE AND ENEMA INSTRUCTIONS DAY BEFORE YOUR SURGERY- INSTRUCTIONS GIVEN BY DR. NESI'S OFFICE.   DO NOT EAT OR DRINK ANYTHING AFTER MIDNIGHT THE NIGHT BEFORE YOUR SURGERY.  YOU MAY BRUSH YOUR TEETH, RINSE OUT YOUR MOUTH--BUT NO WATER, NO FOOD, NO CHEWING GUM, NO MINTS, NO CANDIES, NO CHEWING TOBACCO.   PLEASE TAKE THE FOLLOWING MEDICATIONS THE AM OF YOUR SURGERY WITH A FEW SIPS OF WATER:  METOPROLOL, CRESTOR, TAMSULOSIN, AMLODIPINE.      DO NOT BRING VALUABLES, MONEY, CREDIT CARDS.  DO NOT WEAR JEWELRY, MAKE-UP, NAIL POLISH AND NO METAL PINS OR CLIPS IN YOUR HAIR. CONTACT LENS, DENTURES / PARTIALS, GLASSES SHOULD NOT BE WORN TO SURGERY AND IN MOST CASES-HEARING AIDS WILL NEED TO BE REMOVED.  BRING YOUR GLASSES CASE, ANY EQUIPMENT NEEDED FOR YOUR CONTACT LENS. FOR PATIENTS ADMITTED TO THE HOSPITAL--CHECK OUT TIME THE DAY OF DISCHARGE IS 11:00 AM.  ALL INPATIENT ROOMS ARE PRIVATE - WITH BATHROOM, TELEPHONE, TELEVISION AND WIFI INTERNET.                              PLEASE READ OVER ANY  FACT SHEETS THAT YOU WERE GIVEN: MRSA INFORMATION, BLOOD TRANSFUSION INFORMATION, INCENTIVE SPIROMETER INFORMATION. FAILURE TO FOLLOW THESE INSTRUCTIONS MAY RESULT IN THE CANCELLATION OF YOUR SURGERY.   PATIENT SIGNATURE_________________________________

## 2012-04-14 NOTE — Pre-Procedure Instructions (Signed)
PREOP CBC, CMET, PT, PTT, UA, URINE C&S, EKG WERE DONE TODAY AT [facility] AS PER ORDERS DR. NESI AND ANESTHESIOLOGIST'S GUIDELINES. PT HAS CXR REPORT IN EPIC FROM 08/23/11. COPIES OF PREOP CBC, CMET, PT, PTT, UA, URINE MICROSCOPIC REPORTS FAXED TO DR. NESI'S OFFICE FOR REVIEW OF ABNORMALS--URINE CULTURE IN PROGRESS.

## 2012-04-15 LAB — URINE CULTURE: Culture: NO GROWTH

## 2012-04-16 NOTE — Pre-Procedure Instructions (Signed)
URINE CULTURE REPORT  - NEGATIVE -FAXED TO DR. NESI'S OFFICE

## 2012-04-16 NOTE — H&P (Signed)
History and Physical  Chief Complaint: Inability to urinate.  History of Present Illness: Mr Humbarger went into urinary retention in August and has had an indwelling Foley catheter since.  He failed several voiding trials.  He had previous episodes of urinary retention and was managed successfully with alpha blockers until this episode of urinary retention on 8/30.  Prostate ultrasound showed a volume of 190 ml. He was advised to have an open prostatectomy and is now willing to proceed.  Past Medical History  Diagnosis Date  . Enlarged prostate   . Hypertension   . Cold (disease)     last week ( week of Dec 22- Dec 29 ) - no longer coughing, no fever and feels better now  . BPH (benign prostatic hypertrophy)     problems with acute urinary retention- has indwelling foley catheter  . Arthritis     fingers and hands   Past Surgical History  Procedure Date  .  right elbow surgery as a child     Medications: Amlodipine, Crestor, Diovan, Furosemide, HCTZ, Metoprolol, Allergies: No Known Allergies  No family history on file. Social History:  reports that he has never smoked. He has never used smokeless tobacco. He reports that he drinks alcohol. He reports that he does not use illicit drugs.  ROS: All systems are reviewed and negative except as noted.   Physical Exam:  Vital signs in last 24 hours:  Head: Normal ENT: Neck supple.  No cervical adenopathy.  No thyromegaly. Cardiovascular: Skin warm; not flushed Respiratory: Breaths quiet; no shortness of breath Abdomen: No masses Neurological: Normal sensation to touch Musculoskeletal: Normal motor function arms and legs Lymphatics: No inguinal adenopathy Skin: No rashes Genitourinary:The penis is normal.  Meatus is normal.  Testicles are unremarkable  Laboratory Data:  No results found for this or any previous visit (from the past 24 hour(s)). Recent Results (from the past 240 hour(s))  SURGICAL PCR SCREEN     Status: Normal   Collection Time   04/14/12  8:12 AM      Component Value Range Status Comment   MRSA, PCR NEGATIVE  NEGATIVE Final    Staphylococcus aureus NEGATIVE  NEGATIVE Final   URINE CULTURE     Status: Normal   Collection Time   04/14/12  9:00 AM      Component Value Range Status Comment   Specimen Description URINE, CLEAN CATCH   Final    Special Requests NONE   Final    Culture  Setup Time 04/14/2012 14:43   Final    Colony Count NO GROWTH   Final    Culture NO GROWTH   Final    Report Status 04/15/2012 FINAL   Final    Creatinine:  Basename 04/14/12 0900  CREATININE 0.66     Impression/Assessment:  Acute urinary retention, BPH, elevated PSA.  Plan:  Retropubic prostatectomy.  Heran Campau-HENRY 04/16/2012, 6:40 PM

## 2012-04-17 ENCOUNTER — Encounter (HOSPITAL_COMMUNITY): Payer: Self-pay | Admitting: Anesthesiology

## 2012-04-17 ENCOUNTER — Encounter (HOSPITAL_COMMUNITY)
Admission: RE | Disposition: A | Discharge status: Home / Self Care | Payer: Self-pay | Source: Ambulatory Visit | Attending: Urology

## 2012-04-17 ENCOUNTER — Encounter (HOSPITAL_COMMUNITY): Payer: Self-pay | Admitting: *Deleted

## 2012-04-17 ENCOUNTER — Other Ambulatory Visit: Payer: Self-pay

## 2012-04-17 ENCOUNTER — Inpatient Hospital Stay (HOSPITAL_COMMUNITY): Payer: 59 | Admitting: Anesthesiology

## 2012-04-17 ENCOUNTER — Inpatient Hospital Stay (HOSPITAL_COMMUNITY)
Admission: RE | Admit: 2012-04-17 | Discharge: 2012-04-20 | DRG: 708 | Disposition: A | Discharge status: Home / Self Care | Payer: 59 | Source: Ambulatory Visit | Attending: Urology | Admitting: Urology

## 2012-04-17 DIAGNOSIS — I498 Other specified cardiac arrhythmias: Secondary | ICD-10-CM | POA: Diagnosis present

## 2012-04-17 DIAGNOSIS — I4949 Other premature depolarization: Secondary | ICD-10-CM | POA: Diagnosis present

## 2012-04-17 DIAGNOSIS — R9431 Abnormal electrocardiogram [ECG] [EKG]: Secondary | ICD-10-CM | POA: Diagnosis present

## 2012-04-17 DIAGNOSIS — N400 Enlarged prostate without lower urinary tract symptoms: Principal | ICD-10-CM | POA: Diagnosis present

## 2012-04-17 HISTORY — PX: PROSTATECTOMY: SHX69

## 2012-04-17 LAB — CBC
MCHC: 33.1 g/dL (ref 30.0–36.0)
Platelets: 179 10*3/uL (ref 150–400)
RDW: 13 % (ref 11.5–15.5)
WBC: 11.7 10*3/uL — ABNORMAL HIGH (ref 4.0–10.5)

## 2012-04-17 LAB — TYPE AND SCREEN

## 2012-04-17 LAB — GLUCOSE, CAPILLARY: Glucose-Capillary: 138 mg/dL — ABNORMAL HIGH (ref 70–99)

## 2012-04-17 LAB — COMPREHENSIVE METABOLIC PANEL
ALT: 20 U/L (ref 0–53)
AST: 16 U/L (ref 0–37)
Albumin: 3 g/dL — ABNORMAL LOW (ref 3.5–5.2)
Alkaline Phosphatase: 52 U/L (ref 39–117)
BUN: 8 mg/dL (ref 6–23)
Chloride: 101 mEq/L (ref 96–112)
Potassium: 4 mEq/L (ref 3.5–5.1)
Sodium: 135 mEq/L (ref 135–145)
Total Bilirubin: 1 mg/dL (ref 0.3–1.2)
Total Protein: 5.8 g/dL — ABNORMAL LOW (ref 6.0–8.3)

## 2012-04-17 LAB — HEMOGLOBIN AND HEMATOCRIT, BLOOD: HCT: 35.2 % — ABNORMAL LOW (ref 39.0–52.0)

## 2012-04-17 SURGERY — PROSTATECTOMY, RETROPUBIC
Anesthesia: General | Site: Prostate | Wound class: Clean

## 2012-04-17 MED ORDER — HYDROCODONE-ACETAMINOPHEN 5-325 MG PO TABS
1.0000 | ORAL_TABLET | ORAL | Status: DC | PRN
  Administered 2012-04-17: 1 via ORAL
  Administered 2012-04-19: 2 via ORAL
  Filled 2012-04-17: qty 2
  Filled 2012-04-17: qty 1
  Filled 2012-04-17: qty 2

## 2012-04-17 MED ORDER — SODIUM CHLORIDE 0.9 % IR SOLN: Status: DC | PRN

## 2012-04-17 MED ORDER — BELLADONNA ALKALOIDS-OPIUM 16.2-60 MG RE SUPP
1.0000 | Freq: Four times a day (QID) | RECTAL | Status: DC | PRN
  Administered 2012-04-18 (×2): 1 via RECTAL
  Filled 2012-04-17 (×2): qty 1

## 2012-04-17 MED ORDER — CIPROFLOXACIN IN D5W 400 MG/200ML IV SOLN
400.0000 mg | Freq: Two times a day (BID) | INTRAVENOUS | Status: AC
  Administered 2012-04-17: 400 mg via INTRAVENOUS
  Filled 2012-04-17: qty 200

## 2012-04-17 MED ORDER — SUCCINYLCHOLINE CHLORIDE 20 MG/ML IJ SOLN
INTRAMUSCULAR | Status: DC | PRN
  Administered 2012-04-17: 100 mg via INTRAVENOUS

## 2012-04-17 MED ORDER — PNEUMOCOCCAL VAC POLYVALENT 25 MCG/0.5ML IJ INJ
0.5000 mL | INJECTION | INTRAMUSCULAR | Status: AC
  Administered 2012-04-18: 0.5 mL via INTRAMUSCULAR
  Filled 2012-04-17 (×2): qty 0.5

## 2012-04-17 MED ORDER — LIDOCAINE HCL (CARDIAC) 20 MG/ML IV SOLN
INTRAVENOUS | Status: DC | PRN
  Administered 2012-04-17: 50 mg via INTRAVENOUS

## 2012-04-17 MED ORDER — ATORVASTATIN CALCIUM 40 MG PO TABS
40.0000 mg | ORAL_TABLET | Freq: Every day | ORAL | Status: DC
  Administered 2012-04-18 – 2012-04-19 (×2): 40 mg via ORAL
  Filled 2012-04-17 (×3): qty 1

## 2012-04-17 MED ORDER — LACTATED RINGERS IV SOLN
INTRAVENOUS | Status: DC | PRN
  Administered 2012-04-17: 10:00:00 via INTRAVENOUS

## 2012-04-17 MED ORDER — IRBESARTAN 150 MG PO TABS
150.0000 mg | ORAL_TABLET | Freq: Every day | ORAL | Status: DC
  Administered 2012-04-17: 150 mg via ORAL
  Filled 2012-04-17: qty 1

## 2012-04-17 MED ORDER — METOPROLOL SUCCINATE ER 50 MG PO TB24: 50.0000 mg | ORAL_TABLET | Freq: Every morning | ORAL | Status: DC

## 2012-04-17 MED ORDER — GLYCOPYRROLATE 0.2 MG/ML IJ SOLN
INTRAMUSCULAR | Status: DC | PRN
  Administered 2012-04-17: .4 mg via INTRAVENOUS
  Administered 2012-04-17: .6 mg via INTRAVENOUS

## 2012-04-17 MED ORDER — DEXAMETHASONE SODIUM PHOSPHATE 10 MG/ML IJ SOLN
INTRAMUSCULAR | Status: DC | PRN
  Administered 2012-04-17: 8 mg via INTRAVENOUS

## 2012-04-17 MED ORDER — AMLODIPINE BESYLATE 10 MG PO TABS
10.0000 mg | ORAL_TABLET | Freq: Every day | ORAL | Status: DC
  Filled 2012-04-17: qty 1

## 2012-04-17 MED ORDER — MORPHINE SULFATE 2 MG/ML IJ SOLN
2.0000 mg | INTRAMUSCULAR | Status: DC | PRN
  Administered 2012-04-17: 2 mg via INTRAVENOUS
  Filled 2012-04-17: qty 1

## 2012-04-17 MED ORDER — FENTANYL CITRATE 0.05 MG/ML IJ SOLN
25.0000 ug | INTRAMUSCULAR | Status: DC | PRN
  Administered 2012-04-17 (×2): 25 ug via INTRAVENOUS

## 2012-04-17 MED ORDER — FENTANYL CITRATE 0.05 MG/ML IJ SOLN
INTRAMUSCULAR | Status: DC | PRN
  Administered 2012-04-17 (×7): 50 ug via INTRAVENOUS

## 2012-04-17 MED ORDER — FUROSEMIDE 20 MG PO TABS
20.0000 mg | ORAL_TABLET | Freq: Every morning | ORAL | Status: DC
  Administered 2012-04-17: 20 mg via ORAL
  Filled 2012-04-17: qty 1

## 2012-04-17 MED ORDER — PROMETHAZINE HCL 25 MG/ML IJ SOLN: 6.2500 mg | INTRAMUSCULAR | Status: DC | PRN

## 2012-04-17 MED ORDER — ACETAMINOPHEN 10 MG/ML IV SOLN
1000.0000 mg | Freq: Four times a day (QID) | INTRAVENOUS | Status: AC
  Administered 2012-04-17 – 2012-04-18 (×3): 1000 mg via INTRAVENOUS
  Administered 2012-04-18: 675 mg via INTRAVENOUS
  Filled 2012-04-17 (×5): qty 100

## 2012-04-17 MED ORDER — SODIUM CHLORIDE 0.9 % IV BOLUS (SEPSIS)
1000.0000 mL | Freq: Once | INTRAVENOUS | Status: AC
  Administered 2012-04-17: 1000 mL via INTRAVENOUS

## 2012-04-17 MED ORDER — PROPOFOL 10 MG/ML IV EMUL
INTRAVENOUS | Status: DC | PRN
  Administered 2012-04-17: 150 mg via INTRAVENOUS

## 2012-04-17 MED ORDER — ONDANSETRON HCL 4 MG/2ML IJ SOLN
INTRAMUSCULAR | Status: DC | PRN
  Administered 2012-04-17: 1 mg via INTRAVENOUS
  Administered 2012-04-17: 2 mg via INTRAVENOUS
  Administered 2012-04-17: 1 mg via INTRAVENOUS

## 2012-04-17 MED ORDER — NEOSTIGMINE METHYLSULFATE 1 MG/ML IJ SOLN
INTRAMUSCULAR | Status: DC | PRN
  Administered 2012-04-17: 2 mg via INTRAVENOUS
  Administered 2012-04-17: 1 mg via INTRAVENOUS

## 2012-04-17 MED ORDER — KCL IN DEXTROSE-NACL 10-5-0.45 MEQ/L-%-% IV SOLN
INTRAVENOUS | Status: DC
  Administered 2012-04-17: 100 mL/h via INTRAVENOUS
  Administered 2012-04-18 (×2): via INTRAVENOUS
  Administered 2012-04-18: 125 mL/h via INTRAVENOUS
  Administered 2012-04-19: 1000 mL via INTRAVENOUS
  Administered 2012-04-20: 125 mL/h via INTRAVENOUS
  Filled 2012-04-17 (×11): qty 1000

## 2012-04-17 MED ORDER — 0.9 % SODIUM CHLORIDE (POUR BTL) OPTIME
TOPICAL | Status: DC | PRN
  Administered 2012-04-17: 2000 mL

## 2012-04-17 MED ORDER — ACETAMINOPHEN 10 MG/ML IV SOLN
INTRAVENOUS | Status: DC | PRN
  Administered 2012-04-17: 1000 mg via INTRAVENOUS

## 2012-04-17 MED ORDER — LACTATED RINGERS IV SOLN
INTRAVENOUS | Status: DC
  Administered 2012-04-17: 1000 mL via INTRAVENOUS
  Administered 2012-04-17: 12:00:00 via INTRAVENOUS

## 2012-04-17 MED ORDER — MIDAZOLAM HCL 5 MG/5ML IJ SOLN
INTRAMUSCULAR | Status: DC | PRN
  Administered 2012-04-17 (×2): 1 mg via INTRAVENOUS

## 2012-04-17 MED ORDER — CIPROFLOXACIN IN D5W 400 MG/200ML IV SOLN
400.0000 mg | INTRAVENOUS | Status: AC
  Administered 2012-04-17: 400 mg via INTRAVENOUS

## 2012-04-17 MED ORDER — CISATRACURIUM BESYLATE (PF) 10 MG/5ML IV SOLN
INTRAVENOUS | Status: DC | PRN
  Administered 2012-04-17: 6 mg via INTRAVENOUS
  Administered 2012-04-17: 8 mg via INTRAVENOUS

## 2012-04-17 SURGICAL SUPPLY — 55 items
BAG URINE DRAINAGE (UROLOGICAL SUPPLIES) ×2 IMPLANT
BLADE EXTENDED COATED 6.5IN (ELECTRODE) ×2 IMPLANT
BLADE HEX COATED 2.75 (ELECTRODE) ×2 IMPLANT
CATH FOLEY 2WAY SLVR  5CC 20FR (CATHETERS)
CATH FOLEY 2WAY SLVR 30CC 24FR (CATHETERS) ×1 IMPLANT
CATH FOLEY 2WAY SLVR 5CC 20FR (CATHETERS) ×1 IMPLANT
CATH FOLEY 3WAY 30CC 24FR (CATHETERS) ×2
CATH URTH STD 24FR FL 3W 2 (CATHETERS) IMPLANT
CHLORAPREP W/TINT 26ML (MISCELLANEOUS) ×1 IMPLANT
CLIP LIGATING HEM O LOK PURPLE (MISCELLANEOUS) ×4 IMPLANT
CLIP LIGATING HEMO O LOK GREEN (MISCELLANEOUS) ×4 IMPLANT
CLOTH BEACON ORANGE TIMEOUT ST (SAFETY) ×2 IMPLANT
COVER SURGICAL LIGHT HANDLE (MISCELLANEOUS) ×2 IMPLANT
DISSECTOR ROUND CHERRY 3/8 STR (MISCELLANEOUS) ×2 IMPLANT
DRAIN CHANNEL 10F 3/8 F FF (DRAIN) ×2 IMPLANT
DRAPE LAPAROTOMY T 102X78X121 (DRAPES) ×2 IMPLANT
DRAPE TABLE BACK 44X90 PK DISP (DRAPES) ×1 IMPLANT
DRAPE WARM FLUID 44X44 (DRAPE) ×2 IMPLANT
ELECT REM PT RETURN 9FT ADLT (ELECTROSURGICAL) ×2
ELECTRODE REM PT RTRN 9FT ADLT (ELECTROSURGICAL) ×1 IMPLANT
EVACUATOR SILICONE 100CC (DRAIN) ×2 IMPLANT
GAUZE SPONGE 4X4 16PLY XRAY LF (GAUZE/BANDAGES/DRESSINGS) ×3 IMPLANT
GLOVE BIOGEL M 7.0 STRL (GLOVE) ×2 IMPLANT
GOWN STRL NON-REIN LRG LVL3 (GOWN DISPOSABLE) ×2 IMPLANT
GOWN STRL REIN XL XLG (GOWN DISPOSABLE) ×3 IMPLANT
HEMOSTAT SURGICEL 4X8 (HEMOSTASIS) ×1 IMPLANT
KIT BASIN OR (CUSTOM PROCEDURE TRAY) ×2 IMPLANT
LUBRICANT JELLY ST 5GR 8946 (MISCELLANEOUS) ×10 IMPLANT
MARKER SKIN DUAL TIP RULER LAB (MISCELLANEOUS) ×1 IMPLANT
NS IRRIG 1000ML POUR BTL (IV SOLUTION) ×2 IMPLANT
PACK GENERAL/GYN (CUSTOM PROCEDURE TRAY) ×2 IMPLANT
PLUG CATH AND CAP STER (CATHETERS) ×2 IMPLANT
SET CYSTO W/LG BORE CLAMP LF (SET/KITS/TRAYS/PACK) ×1 IMPLANT
SPONGE GAUZE 4X4 12PLY (GAUZE/BANDAGES/DRESSINGS) ×2 IMPLANT
SPONGE LAP 18X18 X RAY DECT (DISPOSABLE) ×5 IMPLANT
SPONGE LAP 4X18 X RAY DECT (DISPOSABLE) ×1 IMPLANT
STAPLER VISISTAT 35W (STAPLE) ×2 IMPLANT
SUT CHROMIC 0 CT 1 (SUTURE) ×1 IMPLANT
SUT CHROMIC 4 0 RB 1X27 (SUTURE) ×3 IMPLANT
SUT ETHILON 3 0 PS 1 (SUTURE) ×2 IMPLANT
SUT MNCRL AB 4-0 PS2 18 (SUTURE) ×1 IMPLANT
SUT PDS AB 0 CT 36 (SUTURE) ×4 IMPLANT
SUT SILK 0 (SUTURE) ×2
SUT SILK 0 30XBRD TIE 6 (SUTURE) ×2 IMPLANT
SUT VIC AB 0 BRD 54 (SUTURE) ×3 IMPLANT
SUT VIC AB 0 CT1 27 (SUTURE) ×2
SUT VIC AB 0 CT1 27XBRD ANTBC (SUTURE) ×1 IMPLANT
SUT VIC AB 0 UR5 27 (SUTURE) ×8 IMPLANT
SUT VIC AB 1 BRD 54 (SUTURE) ×4 IMPLANT
SUT VIC AB 2-0 UR5 27 (SUTURE) ×2 IMPLANT
SYR 30ML LL (SYRINGE) ×2 IMPLANT
TAPE UMBILICAL COTTON 1/8X30 (MISCELLANEOUS) ×1 IMPLANT
TOWEL OR 17X26 10 PK STRL BLUE (TOWEL DISPOSABLE) ×2 IMPLANT
TOWEL OR NON WOVEN STRL DISP B (DISPOSABLE) ×2 IMPLANT
WATER STERILE IRR 1500ML POUR (IV SOLUTION) ×1 IMPLANT

## 2012-04-17 NOTE — Anesthesia Preprocedure Evaluation (Addendum)
Anesthesia Evaluation  Patient identified by MRN, date of birth, ID band Patient awake    Reviewed: Allergy & Precautions, H&P , NPO status , Patient's Chart, lab work & pertinent test results  Airway Mallampati: II TM Distance: >3 FB Neck ROM: Full    Dental  (+) Chipped and Dental Advisory Given,    Pulmonary neg pulmonary ROS,  breath sounds clear to auscultation  Pulmonary exam normal       Cardiovascular hypertension, Pt. on medications Rhythm:Regular Rate:Normal     Neuro/Psych negative neurological ROS  negative psych ROS   GI/Hepatic negative GI ROS, Neg liver ROS,   Endo/Other  negative endocrine ROS  Renal/GU negative Renal ROS  negative genitourinary   Musculoskeletal negative musculoskeletal ROS (+)   Abdominal   Peds negative pediatric ROS (+)  Hematology negative hematology ROS (+)   Anesthesia Other Findings   Reproductive/Obstetrics negative OB ROS                         Anesthesia Physical Anesthesia Plan  ASA: II  Anesthesia Plan: General   Post-op Pain Management:    Induction: Intravenous  Airway Management Planned: Oral ETT  Additional Equipment:   Intra-op Plan:   Post-operative Plan: Extubation in OR  Informed Consent: I have reviewed the patients History and Physical, chart, labs and discussed the procedure including the risks, benefits and alternatives for the proposed anesthesia with the patient or authorized representative who has indicated his/her understanding and acceptance.   Dental advisory given  Plan Discussed with: CRNA and Surgeon  Anesthesia Plan Comments: (EKG same since 2011)        Anesthesia Quick Evaluation

## 2012-04-17 NOTE — Anesthesia Postprocedure Evaluation (Signed)
  Anesthesia Post-op Note  Patient: Jesus Morrison  Procedure(s) Performed: Procedure(s) (LRB): PROSTATECTOMY RETROPUBIC (N/A)  Patient Location: PACU  Anesthesia Type: General  Level of Consciousness: awake and alert   Airway and Oxygen Therapy: Patient Spontanous Breathing  Post-op Pain: mild  Post-op Assessment: Post-op Vital signs reviewed, Patient's Cardiovascular Status Stable, Respiratory Function Stable, Patent Airway and No signs of Nausea or vomiting  Last Vitals:  Filed Vitals:   04/17/12 1330  BP: 170/63  Pulse: 66  Temp: 36.5 C  Resp: 13    Post-op Vital Signs: stable   Complications: No apparent anesthesia complications

## 2012-04-17 NOTE — Preoperative (Signed)
Beta Blockers   Reason not to administer Beta Blockers:Not Applicable  Pt took beta blocker this a.m.

## 2012-04-17 NOTE — Progress Notes (Signed)
Patient had syncopal episode this afternoon, came back immediately and not remembering what happened.  MD made aware with orders.

## 2012-04-17 NOTE — Progress Notes (Signed)
  Subjective: Patient reports :Suprapubic pain.  Objective: Vital signs in last 24 hours: Temp:  [97.6 F (36.4 C)-97.9 F (36.6 C)] 97.9 F (36.6 C) (01/03 1357) Pulse Rate:  [50-80] 50  (01/03 1622) Resp:  [12-17] 16  (01/03 1622) BP: (106-183)/(43-85) 106/43 mmHg (01/03 1622) SpO2:  [100 %] 100 % (01/03 1622) Weight:  [175 lb (79.379 kg)] 175 lb (79.379 kg) (01/03 1357)  Intake/Output from previous day:   Intake/Output this shift: Total I/O In: 46962 [I.V.:2700; XBMWU:13244; IV Piggyback:1000] Out: 01027 [Urine:19300; Drains:40; Other:350; Blood:500]  Physical Exam:  Alert and oriented. Had a syncopal episode this afternoon.  He feels fine at this time. Lungs - Normal respiratory effort, chest expands symmetrically.  Abdomen - Soft, non-tender & non-distended.  Bowel sounds: normal Blake drain: 40 ml Lab Results:  Basename 04/17/12 1639 04/17/12 1255  HGB 10.9* 11.6*  HCT 32.9* 35.2*   BMET  Basename 04/17/12 1639  NA 135  K 4.0  CL 101  CO2 29  GLUCOSE 164*  BUN 8  CREATININE 0.74  CALCIUM 8.0*   No results found for this basename: LABPT:3,INR:3 in the last 72 hours No results found for this basename: LABURIN:1 in the last 72 hours Results for orders placed during the hospital encounter of 04/14/12  SURGICAL PCR SCREEN     Status: Normal   Collection Time   04/14/12  8:12 AM      Component Value Range Status Comment   MRSA, PCR NEGATIVE  NEGATIVE Final    Staphylococcus aureus NEGATIVE  NEGATIVE Final   URINE CULTURE     Status: Normal   Collection Time   04/14/12  9:00 AM      Component Value Range Status Comment   Specimen Description URINE, CLEAN CATCH   Final    Special Requests NONE   Final    Culture  Setup Time 04/14/2012 14:43   Final    Colony Count NO GROWTH   Final    Culture NO GROWTH   Final    Report Status 04/15/2012 FINAL   Final      Assessment/Plan:  S/P retropubic prostatectomy  Continue CBI.  Advance diet as  tolerated   LOS: 0 days   Dayanis Bergquist-HENRY 04/17/2012, 5:42 PM

## 2012-04-17 NOTE — Care Management Note (Signed)
    Page 1 of 1   04/20/2012     2:14:27 PM   CARE MANAGEMENT NOTE 04/20/2012  Patient:  Jesus Morrison, Jesus Morrison   Account Number:  [id number]  Date Initiated:  04/17/2012  Documentation initiated by:  Lanier Clam  Subjective/Objective Assessment:   ADMITTED W/INABILITY TO URINATE.URINARY RETENTION.     Action/Plan:   FROM HOME   Anticipated DC Date:  04/20/2012   Anticipated DC Plan:  HOME/SELF CARE      DC Planning Services  CM consult      Choice offered to / List presented to:             Status of service:  Completed, signed off Medicare Important Message given?   (If response is "NO", the following Medicare IM given date fields will be blank) Date Medicare IM given:   Date Additional Medicare IM given:    Discharge Disposition:  HOME/SELF CARE  Per UR Regulation:  Reviewed for med. necessity/level of care/duration of stay  If discussed at [facility] Length of Stay Meetings, dates discussed:    Comments:  04/20/12 Timeka Goette RN,BSN NCM [phone] D/C HOME NO ORDERS,OR D/C NEEDS. 04/17/12 Selden Noteboom RN,BSN NCM [phone] S/P SIMPLE RETROPUBIC PROSTATECTOMY.

## 2012-04-17 NOTE — Op Note (Signed)
Jesus Morrison is a 79 y.o.   04/17/2012  Preop diagnosis: Acute urinary retention, BPH  Postop diagnosis: Same  Procedure done: Simple retropubic prostatectomy  Surgeon: Wendie Simmer. [name]  Assistant: [name]  Anesthesia: General  Indication: Patient is an 79 years old male who went into urinary retention in August 2013. He has had an indwelling Foley catheter on and off since. He failed several voiding trials. He had previous episodes of urinary retention and was on alpha blockers. Prostate ultrasound showed a volume of 190 cc. Because of the size of the prostate gland it was decided to proceed with an open prostatectomy.  Procedure: Patient was identified by his wrist band and proper timeout was taken.  Under general anesthesia he was prepped and draped and placed in the supine position. A 20 French Foley catheter was inserted in the bladder. Sterile water was then instilled in the bladder to fill it up. Then a longitudinal incision was made in the suprapubic area from the symphysis pubis to about 3 cm below the umbilicus. The incision was carried down through the fascia which was then incised. The recti muscles were split in the midline. The loose areolar tissue anterior to the bladder neck was then ligated with #0 Vicryl and cut in between ligatures. The capsule of the prostate was then identified. Then 2 stay sutures were placed on the prostatic capsule and a capsulotomy was done. The adenoma of the prostate was then bluntly dissected from the capsule of the prostate with the index finger in the adenoma was removed. All adenomatous tissues were removed. Hemostasis was done at the 5 and 7:00 position with #00 Vicryl. There was no evidence of gross bleeding at the end of the procedure. Then the 20 [place] Foley catheter was removed and replaced with a #24 three-way catheter. The capsulotomy was then closed with #0 Vicryl. The bladder was then irrigated with normal saline and the closure was  watertight. The wound was then irrigated with normal saline. A Blake drain was placed in the Retzius space and brought out through a separate stab wound. The fascia was then closed with #0 Vicryl and the skin was closed with skin staples. The catheter was placed on a continuous irrigation and the returns were pinkish.  Estimated blood loss: 350 cc.  Blood replacement: None.  Needle sponge and instrument counts were correct.  The patient tolerated the procedure well and left the OR in satisfactory condition to postanesthesia care unit.

## 2012-04-17 NOTE — Transfer of Care (Signed)
Immediate Anesthesia Transfer of Care Note  Patient: Jesus Morrison  Procedure(s) Performed: Procedure(s) (LRB) with comments: PROSTATECTOMY RETROPUBIC (N/A) - Simple Retropubic Prostatectomy    Patient Location: PACU  Anesthesia Type:General  Level of Consciousness: awake, oriented, patient cooperative, confused, lethargic and responds to stimulation  Airway & Oxygen Therapy: Patient Spontanous Breathing and Patient connected to face mask oxygen  Post-op Assessment: Report given to PACU RN, Post -op Vital signs reviewed and stable and Patient moving all extremities  Post vital signs: Reviewed and stable  Complications: No apparent anesthesia complications

## 2012-04-18 LAB — BASIC METABOLIC PANEL
BUN: 10 mg/dL (ref 6–23)
CO2: 27 mEq/L (ref 19–32)
Calcium: 8.4 mg/dL (ref 8.4–10.5)
GFR calc non Af Amer: 77 mL/min — ABNORMAL LOW (ref >90)
Glucose, Bld: 172 mg/dL — ABNORMAL HIGH (ref 70–99)
Sodium: 137 mEq/L (ref 135–145)

## 2012-04-18 LAB — HEMOGLOBIN AND HEMATOCRIT, BLOOD: HCT: 30.7 % — ABNORMAL LOW (ref 39.0–52.0)

## 2012-04-18 MED ORDER — AMLODIPINE BESYLATE 10 MG PO TABS
10.0000 mg | ORAL_TABLET | Freq: Every day | ORAL | Status: DC
  Administered 2012-04-18 – 2012-04-20 (×3): 10 mg via ORAL
  Filled 2012-04-18 (×3): qty 1

## 2012-04-18 MED ORDER — METOPROLOL SUCCINATE ER 50 MG PO TB24
50.0000 mg | ORAL_TABLET | Freq: Every day | ORAL | Status: DC
  Administered 2012-04-18 – 2012-04-19 (×2): 50 mg via ORAL
  Filled 2012-04-18 (×3): qty 1

## 2012-04-18 MED ORDER — FUROSEMIDE 20 MG PO TABS
20.0000 mg | ORAL_TABLET | Freq: Every day | ORAL | Status: DC
  Administered 2012-04-18 – 2012-04-20 (×3): 20 mg via ORAL
  Filled 2012-04-18 (×3): qty 1

## 2012-04-18 MED ORDER — IRBESARTAN 75 MG PO TABS
75.0000 mg | ORAL_TABLET | Freq: Every day | ORAL | Status: DC
  Administered 2012-04-18 – 2012-04-20 (×3): 75 mg via ORAL
  Filled 2012-04-18 (×3): qty 1

## 2012-04-18 NOTE — Progress Notes (Signed)
Pt had 5 PVC's in a row, pt asymptomatic, states, "I feel fine." VSS. 138/50, 51, 98.2, 16 100% 2L. MD on call-Dr. Isabel Caprice notified. No new orders at this time.  Will continue to monitor. Newman Nip [place]

## 2012-04-18 NOTE — Progress Notes (Signed)
Call to Dr. Isabel Caprice regarding pt's home HTN medications. Order to restart. Pt continues to well.

## 2012-04-18 NOTE — Progress Notes (Signed)
Pt has had excellent day, ambulated in hallway, and up in chair (SBA only). Pt reports he passed gas, and desires regular food. Will advance his diet to full per Dr. Brunilda Payor note (plan 1/3) states advance diet as tolerated. Urine flowing/CBI nicely, light pink in color.

## 2012-04-18 NOTE — Progress Notes (Signed)
Pt called RN to room. Pt states, "my catheter is backed up."  CBI infusing, urine light pink, pt stood up walked in hallway with assistance. After returning to room, noticed urine leaking around catheter insertion site now not draining in bag.   B & O suppository given for spasms.  MD on call notified- Dr. Isabel Caprice.  Order given to hand irrigate prn.  Hand irrigated as ordered.  Hand irrigated x2 with NS 60 cc. Removed 2 medium sized clots, connected to foley bag.  Afterwards, urine resumes flowing to drainage bag, blood tinged, CBI infusing.  Will continue to closely monitor. Newman Nip [place]

## 2012-04-18 NOTE — Progress Notes (Signed)
Patient ID: Jesus Morrison, male   DOB: Aug 18, 1926, 79 y.o.   MRN: [id number] 1 Day Post-Op Subjective: Patient reports having a good night. Pain control is reasonable. He has ambulated. No flatus or bowel movement. Tolerating clear liquids well. Urine is light pink in color.  Objective: Vital signs in last 24 hours: Temp:  [97.6 F (36.4 C)-98.3 F (36.8 C)] 98.3 F (36.8 C) (01/04 0522) Pulse Rate:  [50-67] 60  (01/04 0522) Resp:  [12-17] 16  (01/04 0522) BP: (106-183)/(43-85) 137/48 mmHg (01/04 0522) SpO2:  [100 %] 100 % (01/04 0522) Weight:  [79.379 kg (175 lb)] 79.379 kg (175 lb) (01/03 1357)  Intake/Output from previous day: 01/03 0701 - 01/04 0700 In: 53037.5 [I.V.:4200; IV Piggyback:1067.5] Out: 40981 [XBJYN:82956; Drains:70; Blood:500] Intake/Output this shift: Total I/O In: 300 [Other:300] Out: 1100 [Urine:1100]  Physical Exam:  Constitutional: Vital signs reviewed. WD WN in NAD   Eyes: PERRL, No scleral icterus.   Cardiovascular: RRR Pulmonary/Chest: Normal effort Abdominal: Soft. Non-tender. Mild distention. Incision dry and intact. Genitourinary: Three-way catheter draining light pink urine. No other concerns. Extremities: No cyanosis or edema   Lab Results:  Basename 04/18/12 0500 04/17/12 1639 04/17/12 1255  HGB 10.2* 10.9* 11.6*  HCT 30.7* 32.9* 35.2*   BMET  Basename 04/18/12 0500 04/17/12 1639  NA 137 135  K 4.4 4.0  CL 102 101  CO2 27 29  GLUCOSE 172* 164*  BUN 10 8  CREATININE 0.89 0.74  CALCIUM 8.4 8.0*   No results found for this basename: LABPT:3,INR:3 in the last 72 hours No results found for this basename: LABURIN:1 in the last 72 hours Results for orders placed during the hospital encounter of 04/14/12  SURGICAL PCR SCREEN     Status: Normal   Collection Time   04/14/12  8:12 AM      Component Value Range Status Comment   MRSA, PCR NEGATIVE  NEGATIVE Final    Staphylococcus aureus NEGATIVE  NEGATIVE Final   URINE CULTURE     Status:  Normal   Collection Time   04/14/12  9:00 AM      Component Value Range Status Comment   Specimen Description URINE, CLEAN CATCH   Final    Special Requests NONE   Final    Culture  Setup Time 04/14/2012 14:43   Final    Colony Count NO GROWTH   Final    Culture NO GROWTH   Final    Report Status 04/15/2012 FINAL   Final     Studies/Results: No results found.  Assessment/Plan:   Doing well. We'll continue CBI for now. Advance diet with positive bowel activity. Continue to encourage ambulation. We'll leave JP drain for now.   LOS: 1 day   Jesus Morrison S 04/18/2012, 9:20 AM

## 2012-04-19 NOTE — Progress Notes (Signed)
Pt had 17 beats if VTACH. Md notified.

## 2012-04-19 NOTE — Progress Notes (Signed)
I was notified that this patient had another run of V-tach which was asymptomatic, and occurred in his sleep. Labs this morning were stable. I recommended obtaining an EKG. If this issue continues he may need a cardiology consult. Vitals are currently stable. Will observe for now and notify Dr. Brunilda Payor.  Filed Vitals:   04/19/12 2116  BP: 148/48  Pulse: 60  Temp: 98.5 F (36.9 C)  Resp:

## 2012-04-19 NOTE — Progress Notes (Signed)
Patient ID: Jesus Morrison, male   DOB: [birth year], 79 y.o.   MRN: [id number] 2 Days Post-Op Subjective: Patient reports no problems or issues. He is tolerated a general diet well. Patient has had a bowel movement. He has continued to intermittently ambulate. Pain control is quite good. Urine is very light pink with continuous bladder irrigation.  Objective: Vital signs in last 24 hours: Temp:  [97.9 F (36.6 C)-98.7 F (37.1 C)] 98.7 F (37.1 C) (01/05 0426) Pulse Rate:  [50-59] 50  (01/05 0426) Resp:  [18] 18  (01/05 0426) BP: (138-174)/(48-67) 138/48 mmHg (01/05 0426) SpO2:  [100 %] 100 % (01/05 0426)  Intake/Output from previous day: 01/04 0701 - 01/05 0700 In: 40981 [P.O.:600; I.V.:2500] Out: 19147 [phone]; Drains:20] Intake/Output this shift:    Physical Exam:  Constitutional: Vital signs reviewed. WD WN in NAD   Eyes: PERRL, No scleral icterus.   Cardiovascular: RRR Pulmonary/Chest: Normal effort Abdominal: Soft. Non-tender, non-distended incision dry and intact. Genitourinary: Three-way Foley catheter draining light pink urine. Extremities: No cyanosis or edema   Lab Results:  Basename 04/18/12 0500 04/17/12 1639 04/17/12 1255  HGB 10.2* 10.9* 11.6*  HCT 30.7* 32.9* 35.2*   BMET  Basename 04/18/12 0500 04/17/12 1639  NA 137 135  K 4.4 4.0  CL 102 101  CO2 27 29  GLUCOSE 172* 164*  BUN 10 8  CREATININE 0.89 0.74  CALCIUM 8.4 8.0*   No results found for this basename: LABPT:3,INR:3 in the last 72 hours No results found for this basename: LABURIN:1 in the last 72 hours Results for orders placed during the hospital encounter of 04/14/12  SURGICAL PCR SCREEN     Status: Normal   Collection Time   04/14/12  8:12 AM      Component Value Range Status Comment   MRSA, PCR NEGATIVE  NEGATIVE Final    Staphylococcus aureus NEGATIVE  NEGATIVE Final   URINE CULTURE     Status: Normal   Collection Time   04/14/12  9:00 AM      Component Value Range Status  Comment   Specimen Description URINE, CLEAN CATCH   Final    Special Requests NONE   Final    Culture  Setup Time 04/14/2012 14:43   Final    Colony Count NO GROWTH   Final    Culture NO GROWTH   Final    Report Status 04/15/2012 FINAL   Final     Studies/Results: No results found.  Assessment/Plan:   Doing well postoperative day 2. The patient should be ready for discharge tomorrow. Jackson-Pratt drain to be removed today. We will cancel his continuous bladder irrigation and observed his urine. He will go home with an indwelling catheter.   LOS: 2 days   Youlanda Tomassetti S 04/19/2012, 9:54 AM

## 2012-04-20 ENCOUNTER — Encounter (HOSPITAL_COMMUNITY): Payer: Self-pay | Admitting: Urology

## 2012-04-20 LAB — BASIC METABOLIC PANEL
BUN: 9 mg/dL (ref 6–23)
Calcium: 8.3 mg/dL — ABNORMAL LOW (ref 8.4–10.5)
GFR calc Af Amer: >90 mL/min (ref >90)
GFR calc non Af Amer: 80 mL/min — ABNORMAL LOW (ref >90)
Potassium: 4.1 mEq/L (ref 3.5–5.1)
Sodium: 135 mEq/L (ref 135–145)

## 2012-04-20 MED ORDER — CIPROFLOXACIN HCL 250 MG PO TABS: 250.0000 mg | ORAL_TABLET | Freq: Two times a day (BID) | ORAL | Status: DC

## 2012-04-20 NOTE — Discharge Summary (Signed)
Patient ID: Jesus Morrison MRN: [id number] DOB/AGE: Oct 16, 1926 79 y.o.  Admit date: 04/17/2012 Discharge date: 04/20/2012  Admission Diagnoses: ACUTE URINARY RETENTION, BENIGN PROSTATIC HYPERTROPHY  Discharge Diagnoses:  Active Problems:  * No active hospital problems. *    Discharged Condition: Improved  Hospital Course: Jesus Morrison was admitted on 1/3 for retropubic prostatectomy for urinary retention.  He developed PVC's during the hospitalization> EKG showed inferior infarct age undetermined and sinus bradycardia.  He is com[pletely asymptomatic.  Troponin levels are within normal limits.  I discussed those findings with Dr Verdis Prime and he feels that the patient can be seen as outpatient. He tolerates his diet well.  The Foley catheter is draining light pink urine.  Wound is clean and dry.    Significant Diagnostic Studies: Troponin levels.  Treatments: Retropubic prostatectomy  Discharge Exam: Blood pressure 144/49, pulse 54, temperature 98.5 F (36.9 C), temperature source Oral, resp. rate 18, height 5\' 5"  (1.651 m), weight 175 lb (79.379 kg), SpO2 98.00%. Lungs: clear. Heart: Regular sinus rhythm Abdomen: Soft, non distended, non tender.  Wound clean and dry.  Disposition: 01-Home or Self Care     Medication List     As of 04/20/2012  1:28 PM    TAKE these medications         amLODipine 10 MG tablet   Commonly known as: NORVASC   Take 10 mg by mouth daily. Every am      ciprofloxacin 250 MG tablet   Commonly known as: CIPRO   Take 1 tablet (250 mg total) by mouth 2 (two) times daily.      furosemide 20 MG tablet   Commonly known as: LASIX   Take 20 mg by mouth every morning.      metoprolol succinate 50 MG 24 hr tablet   Commonly known as: TOPROL-XL   Take 50 mg by mouth every morning. Take with or immediately following a meal.      rosuvastatin 20 MG tablet   Commonly known as: CRESTOR   Take 20 mg by mouth daily.      Tamsulosin HCl 0.4 MG Caps   Commonly  known as: FLOMAX   Take 0.4 mg by mouth daily.      valsartan 160 MG tablet   Commonly known as: DIOVAN   Take 160 mg by mouth every morning.         Signed: Everett Ricciardelli-HENRY 04/20/2012, 1:28 PM

## 2012-04-20 NOTE — Progress Notes (Signed)
Patient discharged to home with [facility] in place with light pink urine output.Foley bag teaching done, patient verbalized that he knows how to do it since this is not his first time, teaching reinforced. PIV removed no s/s of swelling or infiltration noted. [employer] ATM card and $11 bills given back to the patient.PAtient d/c to home ambulatory, daughter at  bedside.

## 2012-04-28 MED FILL — Indigotindisulfonate Sodium Inj 8 MG/ML: INTRAMUSCULAR | Qty: 5 | Status: AC

## 2013-05-26 ENCOUNTER — Other Ambulatory Visit: Payer: Self-pay | Admitting: Interventional Cardiology

## 2013-10-05 ENCOUNTER — Telehealth: Payer: Self-pay

## 2013-10-05 MED ORDER — VALSARTAN-HYDROCHLOROTHIAZIDE 320-12.5 MG PO TABS: ORAL_TABLET | ORAL | Status: DC

## 2013-10-05 NOTE — Telephone Encounter (Signed)
Refill pt valsartan-hctz 30-0 pt is past due for f/u. Pt needs to schedule an appt.

## 2013-11-12 ENCOUNTER — Other Ambulatory Visit: Payer: Self-pay

## 2013-11-12 MED ORDER — VALSARTAN-HYDROCHLOROTHIAZIDE 320-12.5 MG PO TABS: ORAL_TABLET | ORAL | Status: DC

## 2014-04-23 ENCOUNTER — Emergency Department (HOSPITAL_COMMUNITY)
Admission: EM | Admit: 2014-04-23 | Discharge: 2014-04-23 | Disposition: A | Discharge status: Home / Self Care | Payer: 59 | Attending: Emergency Medicine | Admitting: Emergency Medicine

## 2014-04-23 ENCOUNTER — Encounter (HOSPITAL_COMMUNITY): Payer: Self-pay | Admitting: *Deleted

## 2014-04-23 DIAGNOSIS — Y998 Other external cause status: Secondary | ICD-10-CM | POA: Insufficient documentation

## 2014-04-23 DIAGNOSIS — S0990XA Unspecified injury of head, initial encounter: Secondary | ICD-10-CM | POA: Diagnosis not present

## 2014-04-23 DIAGNOSIS — N400 Enlarged prostate without lower urinary tract symptoms: Secondary | ICD-10-CM | POA: Insufficient documentation

## 2014-04-23 DIAGNOSIS — Y92092 Bedroom in other non-institutional residence as the place of occurrence of the external cause: Secondary | ICD-10-CM | POA: Diagnosis not present

## 2014-04-23 DIAGNOSIS — Y9301 Activity, walking, marching and hiking: Secondary | ICD-10-CM | POA: Insufficient documentation

## 2014-04-23 DIAGNOSIS — Z79899 Other long term (current) drug therapy: Secondary | ICD-10-CM | POA: Diagnosis not present

## 2014-04-23 DIAGNOSIS — M1389 Other specified arthritis, multiple sites: Secondary | ICD-10-CM | POA: Insufficient documentation

## 2014-04-23 DIAGNOSIS — I10 Essential (primary) hypertension: Secondary | ICD-10-CM | POA: Diagnosis not present

## 2014-04-23 DIAGNOSIS — W01198A Fall on same level from slipping, tripping and stumbling with subsequent striking against other object, initial encounter: Secondary | ICD-10-CM | POA: Diagnosis not present

## 2014-04-23 DIAGNOSIS — H1132 Conjunctival hemorrhage, left eye: Secondary | ICD-10-CM | POA: Insufficient documentation

## 2014-04-23 MED ORDER — FLUORESCEIN SODIUM 1 MG OP STRP
1.0000 | ORAL_STRIP | Freq: Once | OPHTHALMIC | Status: AC
  Administered 2014-04-23: 1 via OPHTHALMIC

## 2014-04-23 MED ORDER — TETRACAINE HCL 0.5 % OP SOLN
2.0000 [drp] | Freq: Once | OPHTHALMIC | Status: AC
  Administered 2014-04-23: 2 [drp] via OPHTHALMIC

## 2014-04-23 MED ORDER — IBUPROFEN 800 MG PO TABS: 800.0000 mg | ORAL_TABLET | Freq: Three times a day (TID) | ORAL | Status: DC

## 2014-04-23 NOTE — ED Notes (Signed)
Pt reports getting out of bed at 1am to use restroom, he stumbled and hit his head on a table. Denies loc. Pt went back to bed after the fall. Pt has abrasions noted to left forehead, bruising noted around left eye, denies any vision changes. Denies being on blood thinners.

## 2014-04-23 NOTE — ED Provider Notes (Signed)
CSN: [id number]     Arrival date & time 04/23/14  1834 History   First MD Initiated Contact with Patient 04/23/14 2034     Chief Complaint  Patient presents with  . Fall     (Consider location/radiation/quality/duration/timing/severity/associated sxs/prior Treatment) HPI Comments: 81 year old male who states that approximately 18 hours ago he fell while he was trying to walk to the bathroom, lost his balance getting out of bed striking his head on the bedside table falling to the ground. He had no loss of consciousness, no neck pain, he was able to get back up and walk to the bathroom without difficulty. Throughout the day today he has had a small amount of pain in the left frontal area of his scalp where he struck the bedside table but denies any hematomas, nausea, vomiting, seizures, weakness, numbness or balance problems. He has been ambulating without difficulty today, he went shopping with one of his friends and had no difficulty walking around the store, he states that he wants to be checked out prior to going back to work to make sure that he is okay for Monday morning.  Patient is a 81 y.o. male presenting with fall. The history is provided by the patient.  Fall    Past Medical History  Diagnosis Date  . Enlarged prostate   . Hypertension   . Cold (disease)     last week ( week of Dec 22- Dec 29 ) - no longer coughing, no fever and feels better now  . BPH (benign prostatic hypertrophy)     problems with acute urinary retention- has indwelling foley catheter  . Arthritis     fingers and hands   Past Surgical History  Procedure Laterality Date  .  right elbow surgery as a child    . Prostatectomy  04/17/2012    Procedure: PROSTATECTOMY RETROPUBIC;  Surgeon: Lindaann SloughMarc-Henry Nesi, MD;  Location: [facility];  Service: Urology;  Laterality: N/A;  Simple Retropubic Prostatectomy     History reviewed. No pertinent family history. History  Substance Use Topics  . Smoking status: Never  Smoker   . Smokeless tobacco: Never Used  . Alcohol Use: Yes     Comment: occas    Review of Systems  All other systems reviewed and are negative.     Allergies  Review of patient's allergies indicates no known allergies.  Home Medications   Prior to Admission medications   Medication Sig Start Date End Date Taking? Authorizing Provider  amLODipine (NORVASC) 10 MG tablet Take 10 mg by mouth daily. Every am    Historical Provider, MD  ciprofloxacin (CIPRO) 250 MG tablet Take 1 tablet (250 mg total) by mouth 2 (two) times daily. 04/20/12   Danae ChenMarc H Nesi, MD  furosemide (LASIX) 20 MG tablet Take 20 mg by mouth every morning.    Historical Provider, MD  ibuprofen (ADVIL,MOTRIN) 800 MG tablet Take 1 tablet (800 mg total) by mouth 3 (three) times daily. 04/23/14   Vida RollerBrian D Harmoni Lucus, MD  metoprolol succinate (TOPROL-XL) 50 MG 24 hr tablet Take 50 mg by mouth every morning. Take with or immediately following a meal.    Historical Provider, MD  rosuvastatin (CRESTOR) 20 MG tablet Take 20 mg by mouth daily.    Historical Provider, MD  Tamsulosin HCl (FLOMAX) 0.4 MG CAPS Take 0.4 mg by mouth daily.    Historical Provider, MD  valsartan-hydrochlorothiazide (DIOVAN-HCT) 320-12.5 MG per tablet take 1 tablet by mouth once daily 11/12/13   Barry DienesHenry W  Leia Alf, MD   BP 181/76 mmHg  Pulse 53  Temp(Src) 98.3 F (36.8 C) (Oral)  Resp 19  SpO2 100% Physical Exam  Constitutional: He appears well-developed and well-nourished. No distress.  HENT:  Head: Normocephalic.  Mouth/Throat: Oropharynx is clear and moist. No oropharyngeal exudate.  no facial tenderness, deformity, malocclusion or hemotympanum.  no battle's sign or racoon eyes. Several abrasions to the left forehead, no hematomas or lacerations, no tenderness of the periorbital areas or orbital brim   Eyes: Conjunctivae and EOM are normal. Pupils are equal, round, and reactive to light. Right eye exhibits no discharge. Left eye exhibits no discharge. No  scleral icterus.  Subconjunctival hemorrhage to the left lower anterior globe, normal pupil, normal-appearing extraocular movements, normal reaction  Neck: Normal range of motion. Neck supple. No JVD present. No thyromegaly present.  No tenderness over the posterior neck or scalp  Cardiovascular: Normal rate, regular rhythm, normal heart sounds and intact distal pulses.  Exam reveals no gallop and no friction rub.   No murmur heard. Pulmonary/Chest: Effort normal and breath sounds normal. No respiratory distress. He has no wheezes. He has no rales.  Abdominal: Soft. Bowel sounds are normal. He exhibits no distension and no mass. There is no tenderness.  Musculoskeletal: Normal range of motion. He exhibits no edema or tenderness.  Supple joints, soft compartments, moves all extremities with normal strength and range of motion of all major joints, no tenderness over the cervical thoracic or lumbar spines.  Lymphadenopathy:    He has no cervical adenopathy.  Neurological: He is alert. Coordination normal.  Normal strength and sensation, normal speech, moves all extremities 4 without difficulty  Skin: Skin is warm and dry. No rash noted. No erythema.  Psychiatric: He has a normal mood and affect. His behavior is normal.  Nursing note and vitals reviewed.   ED Course  Procedures (including critical care time) Labs Review Labs Reviewed - No data to display  Imaging Review No results found.    MDM   Final diagnoses:  Minor head injury, initial encounter  Subconjunctival hemorrhage of left eye    Abrasions noted, no significant head injury, the patient has a normal neurologic exam and has no complaints, he does have a subconjunctival hemorrhage but doubt that this is of any significant concern, we'll perform a tetracaine and floor seen and examined with a Woods lamp to rule out corneal abrasion.  Fall appears to be mechanical  Fluorescein and tetracaine exam shows no signs of  conjunctival or corneal injury.  Vida Roller, MD 04/23/14 2158

## 2014-04-23 NOTE — Discharge Instructions (Signed)
Please call your doctor for a followup appointment within 24-48 hours. When you talk to your doctor please let them know that you were seen in the emergency department and have them acquire all of your records so that they can discuss the findings with you and formulate a treatment plan to fully care for your new and ongoing problems. ° °

## 2014-07-29 ENCOUNTER — Encounter (HOSPITAL_COMMUNITY): Payer: Self-pay | Admitting: *Deleted

## 2014-07-29 ENCOUNTER — Emergency Department (HOSPITAL_COMMUNITY): Payer: 59

## 2014-07-29 ENCOUNTER — Emergency Department (HOSPITAL_COMMUNITY)
Admission: EM | Admit: 2014-07-29 | Discharge: 2014-07-29 | Disposition: A | Discharge status: Home / Self Care | Payer: 59 | Attending: Emergency Medicine | Admitting: Emergency Medicine

## 2014-07-29 DIAGNOSIS — H1132 Conjunctival hemorrhage, left eye: Secondary | ICD-10-CM | POA: Insufficient documentation

## 2014-07-29 DIAGNOSIS — M1990 Unspecified osteoarthritis, unspecified site: Secondary | ICD-10-CM | POA: Insufficient documentation

## 2014-07-29 DIAGNOSIS — Y9289 Other specified places as the place of occurrence of the external cause: Secondary | ICD-10-CM | POA: Diagnosis not present

## 2014-07-29 DIAGNOSIS — I10 Essential (primary) hypertension: Secondary | ICD-10-CM | POA: Insufficient documentation

## 2014-07-29 DIAGNOSIS — W01198A Fall on same level from slipping, tripping and stumbling with subsequent striking against other object, initial encounter: Secondary | ICD-10-CM | POA: Insufficient documentation

## 2014-07-29 DIAGNOSIS — S0990XA Unspecified injury of head, initial encounter: Secondary | ICD-10-CM | POA: Diagnosis present

## 2014-07-29 DIAGNOSIS — Z79899 Other long term (current) drug therapy: Secondary | ICD-10-CM | POA: Insufficient documentation

## 2014-07-29 DIAGNOSIS — Z87448 Personal history of other diseases of urinary system: Secondary | ICD-10-CM | POA: Diagnosis not present

## 2014-07-29 DIAGNOSIS — Y9389 Activity, other specified: Secondary | ICD-10-CM | POA: Insufficient documentation

## 2014-07-29 DIAGNOSIS — S0083XA Contusion of other part of head, initial encounter: Secondary | ICD-10-CM | POA: Insufficient documentation

## 2014-07-29 DIAGNOSIS — Y92009 Unspecified place in unspecified non-institutional (private) residence as the place of occurrence of the external cause: Secondary | ICD-10-CM

## 2014-07-29 DIAGNOSIS — W19XXXA Unspecified fall, initial encounter: Secondary | ICD-10-CM

## 2014-07-29 DIAGNOSIS — Z792 Long term (current) use of antibiotics: Secondary | ICD-10-CM | POA: Diagnosis not present

## 2014-07-29 DIAGNOSIS — Y998 Other external cause status: Secondary | ICD-10-CM | POA: Diagnosis not present

## 2014-07-29 MED ORDER — ACETAMINOPHEN 500 MG PO TABS
1000.0000 mg | ORAL_TABLET | Freq: Once | ORAL | Status: AC
  Administered 2014-07-29: 1000 mg via ORAL
  Filled 2014-07-29: qty 2

## 2014-07-29 MED ORDER — FLUORESCEIN SODIUM 1 MG OP STRP
1.0000 | ORAL_STRIP | Freq: Once | OPHTHALMIC | Status: AC
  Administered 2014-07-29: 1 via OPHTHALMIC
  Filled 2014-07-29: qty 1

## 2014-07-29 NOTE — ED Notes (Signed)
Patient states he was getting out of bed in a hurry 2 nights ago.  He states he got tangled in the covers and fell.  Patient hit his face/eye on the left.  Patient states he couldn't sleep last night.  Patient reports his vision is normal.  Patient denies dizziness.  He denies headache but states he can "feel" it where he hit his face.  Patient has hx of hypertension.  He has not taken his meds yet.

## 2014-07-29 NOTE — ED Provider Notes (Signed)
CSN: [id number]     Arrival date & time 07/29/14  0702 History   First MD Initiated Contact with Patient 07/29/14 [phone]     Chief Complaint  Patient presents with  . Fall  . Facial Pain     HPI Pt was seen at 0720. Per pt, c/o sudden onset and resolution of one episode of slip and fall 2 nights ago. Pt states he was "getting out of bed in a hurry" and "got tangled in the covers and fell down." Pt states he hit the left side of his face against a dresser. States the left side of his face "still hurts." Pt has not taken any meds for pain. Denies any other injuries. Denies eye pain, no visual changes, no epistaxis, no intra-oral injury, no neck pain, no N/V/D, no headache, no syncope/LOC, no AMS, no focal motor weakness, no tingling/numbness in extremities.    Past Medical History  Diagnosis Date  . Enlarged prostate   . Hypertension   . Cold (disease)     last week ( week of Dec 22- Dec 29 ) - no longer coughing, no fever and feels better now  . BPH (benign prostatic hypertrophy)     problems with acute urinary retention- has indwelling foley catheter  . Arthritis     fingers and hands   Past Surgical History  Procedure Laterality Date  .  right elbow surgery as a child    . Prostatectomy  04/17/2012    Procedure: PROSTATECTOMY RETROPUBIC;  Surgeon: Lindaann SloughMarc-Henry Nesi, MD;  Location: [facility];  Service: Urology;  Laterality: N/A;  Simple Retropubic Prostatectomy      History  Substance Use Topics  . Smoking status: Never Smoker   . Smokeless tobacco: Never Used  . Alcohol Use: Yes     Comment: occas    Review of Systems ROS: Statement: All systems negative except as marked or noted in the HPI; Constitutional: Negative for fever and chills. ; ; Eyes: Negative for eye pain, redness and discharge. ; ; ENMT: +left face pain. Negative for ear pain, hoarseness, nasal congestion, sinus pressure and sore throat. ; ; Cardiovascular: Negative for chest pain, palpitations, diaphoresis, dyspnea  and peripheral edema. ; ; Respiratory: Negative for cough, wheezing and stridor. ; ; Gastrointestinal: Negative for nausea, vomiting, diarrhea, abdominal pain, blood in stool, hematemesis, jaundice and rectal bleeding. . ; ; Genitourinary: Negative for dysuria, flank pain and hematuria. ; ; Musculoskeletal: Negative for back pain and neck pain. Negative for swelling and trauma.; ; Skin: Negative for pruritus, rash, abrasions, blisters, bruising and skin lesion.; ; Neuro: Negative for headache, lightheadedness and neck stiffness. Negative for weakness, altered level of consciousness , altered mental status, extremity weakness, paresthesias, involuntary movement, seizure and syncope.      Allergies  Review of patient's allergies indicates no known allergies.  Home Medications   Prior to Admission medications   Medication Sig Start Date End Date Taking? Authorizing Provider  amLODipine (NORVASC) 10 MG tablet Take 10 mg by mouth daily. Every am    Historical Provider, MD  ciprofloxacin (CIPRO) 250 MG tablet Take 1 tablet (250 mg total) by mouth 2 (two) times daily. 04/20/12   Su GrandMarc Nesi, MD  furosemide (LASIX) 20 MG tablet Take 20 mg by mouth every morning.    Historical Provider, MD  ibuprofen (ADVIL,MOTRIN) 800 MG tablet Take 1 tablet (800 mg total) by mouth 3 (three) times daily. 04/23/14   Eber HongBrian Miller, MD  metoprolol succinate (TOPROL-XL) 50 MG 24  hr tablet Take 50 mg by mouth every morning. Take with or immediately following a meal.    Historical Provider, MD  rosuvastatin (CRESTOR) 20 MG tablet Take 20 mg by mouth daily.    Historical Provider, MD  Tamsulosin HCl (FLOMAX) 0.4 MG CAPS Take 0.4 mg by mouth daily.    Historical Provider, MD  valsartan-hydrochlorothiazide (DIOVAN-HCT) 320-12.5 MG per tablet take 1 tablet by mouth once daily 11/12/13   Lyn Records, MD   BP 172/89 mmHg  Pulse 60  Temp(Src) 97.5 F (36.4 C) (Oral)  Resp 24  Ht  (1.651 m)  Wt 165 lb (74.844 kg)  BMI 27.46  kg/m2  SpO2 99%   07:21 Visual Acuity HP  Visual Acuity - Bilateral Distance: 20/50 ; R Distance: 20/70 ; L Distance: 20/40      Physical Exam  0725: Physical examination: Vital signs and O2 SAT: Reviewed; Constitutional: Well developed, Well nourished, Well hydrated, In no acute distress; Head and Face: Normocephalic, No scalp hematomas, no lacs.  Non-tender to palp superior and inferior orbital rim areas.  +mild left zygoma tenderness with faint localized ecchymosis and edema to left infraorbital area and left zygoma area. No open wounds.  No mandibular tenderness.;  Eyes: EOMI without pain. PERRL, No scleral icterus.; Eye Exam: Right pupil: Size: 3 mm; Findings: Normal, Briskly reactive; Left pupil: Size: 3 mm; Findings: Normal, Briskly reactive; Extraocular movement: Bilateral normal, No nystagmus. ; Eyelid: Bilateral normal, No edema.  No ptosis. No FB identified. ; Conjunctiva and sclera: Bilateral normal, +left lateral subconjunctival hemorrhage, no chemosis, no discharge.  No obvious hyphema or hypopion.  ; Cornea and anterior chamber: Bilateral normal. Fluorescein stain left eye: negative for corneal abrasion, no corneal ulcer, neg Seidel's.; ; Diagnostic medications: Left fluorescein; Diagnostic instrument: Ophthalmoscope, Wood's lamp; Visual acuity right: 20/70; Visual acuity left: 20/40.;; ENMT: Mouth and pharynx normal, Left TM normal, Right TM normal, Mucous membranes moist, +teeth and tongue intact.  No intraoral or intranasal bleeding.  No septal hematomas.  No trismus, no malocclusion.; Neck: Supple, Full range of motion, No lymphadenopathy; Spine: No midline CS, TS, LS tenderness.; Cardiovascular: Regular rate and rhythm, No gallop; Respiratory: Breath sounds clear & equal bilaterally, No wheezes. Normal respiratory effort/excursion; Chest: Nontender, No deformity, Movement normal, No crepitus; Abdomen: Soft, Nontender, Nondistended, Normal bowel sounds; Genitourinary: No CVA tenderness;  Extremities: No deformity, Full range of motion, Neurovascularly intact, Pulses normal, No edema, Pelvis stable; Neuro: AA&Ox3, Gait normal, Normal coordination, Normal speech, No nystagmus, No facial droop, Major CN grossly intact.  No gross focal motor or sensory deficits in extremities.; Skin: Color normal, Warm, Dry    ED Course  Procedures     EKG Interpretation None      MDM  MDM Reviewed: previous chart, nursing note and vitals Interpretation: CT scan   Ct Head Wo Contrast 07/29/2014   CLINICAL DATA:  Fall out of bed. Blunt trauma to left head and face. Headache and left facial pain.  EXAM: CT HEAD WITHOUT CONTRAST  CT MAXILLOFACIAL WITHOUT CONTRAST  TECHNIQUE: Multidetector CT imaging of the head and maxillofacial structures were performed using the standard protocol without intravenous contrast. Multiplanar CT image reconstructions of the maxillofacial structures were also generated.  COMPARISON:  05/16/2008  FINDINGS: CT HEAD FINDINGS  There is no evidence of intracranial hemorrhage, brain edema, or other signs of acute infarction. There is no evidence of intracranial mass lesion or mass effect. No abnormal extraaxial fluid collections are identified.  Mild diffuse  cerebral atrophy is again seen. Mild to moderate chronic small vessel disease is stable in appearance. Ventricles are stable in size. No evidence of skull fracture.  CT MAXILLOFACIAL FINDINGS  No evidence of acute fracture involving the facial bones or orbits. Old nasal bone deformity noted which is unchanged since prior exam.  Globes and other intraorbital anatomy are normal in appearance. No evidence of orbital emphysema or sinus air-fluid levels. Mucosal thickening is again seen involving the ethmoid sinuses bilaterally, consistent with chronic sinusitis.  IMPRESSION: No acute intracranial findings. Stable cerebral atrophy and chronic small vessel disease.  No evidence of acute orbital or facial bone fracture. Old nasal  bone fracture deformity and chronic ethmoid sinus disease.   Electronically Signed   By: Myles Rosenthal M.D.   On: 07/29/2014 08:19   Ct Maxillofacial Wo Cm 07/29/2014   CLINICAL DATA:  Fall out of bed. Blunt trauma to left head and face. Headache and left facial pain.  EXAM: CT HEAD WITHOUT CONTRAST  CT MAXILLOFACIAL WITHOUT CONTRAST  TECHNIQUE: Multidetector CT imaging of the head and maxillofacial structures were performed using the standard protocol without intravenous contrast. Multiplanar CT image reconstructions of the maxillofacial structures were also generated.  COMPARISON:  05/16/2008  FINDINGS: CT HEAD FINDINGS  There is no evidence of intracranial hemorrhage, brain edema, or other signs of acute infarction. There is no evidence of intracranial mass lesion or mass effect. No abnormal extraaxial fluid collections are identified.  Mild diffuse cerebral atrophy is again seen. Mild to moderate chronic small vessel disease is stable in appearance. Ventricles are stable in size. No evidence of skull fracture.  CT MAXILLOFACIAL FINDINGS  No evidence of acute fracture involving the facial bones or orbits. Old nasal bone deformity noted which is unchanged since prior exam.  Globes and other intraorbital anatomy are normal in appearance. No evidence of orbital emphysema or sinus air-fluid levels. Mucosal thickening is again seen involving the ethmoid sinuses bilaterally, consistent with chronic sinusitis.  IMPRESSION: No acute intracranial findings. Stable cerebral atrophy and chronic small vessel disease.  No evidence of acute orbital or facial bone fracture. Old nasal bone fracture deformity and chronic ethmoid sinus disease.   Electronically Signed   By: Myles Rosenthal M.D.   On: 07/29/2014 08:19    0830:  No corneal abrasion. CT scans reassuring. Tx symptomatically. Dx and testing d/w pt.  Questions answered.  Verb understanding, agreeable to d/c home with outpt f/u.   Samuel Jester, DO 08/01/14 2048

## 2014-07-29 NOTE — Discharge Instructions (Signed)
°Emergency Department Resource Guide °1) Find a Doctor and Pay Out of Pocket °Although you won't have to find out who is covered by your insurance plan, it is a good idea to ask around and get recommendations. You will then need to call the office and see if the doctor you have chosen will accept you as a new patient and what types of options they offer for patients who are self-pay. Some doctors offer discounts or will set up payment plans for their patients who do not have insurance, but you will need to ask so you aren't surprised when you get to your appointment. ° °2) Contact Your Local Health Department °Not all health departments have doctors that can see patients for sick visits, but many do, so it is worth a call to see if yours does. If you don't know where your local health department is, you can check in your phone book. The CDC also has a tool to help you locate your state's health department, and many state websites also have listings of all of their local health departments. ° °3) Find a Walk-in Clinic °If your illness is not likely to be very severe or complicated, you may want to try a walk in clinic. These are popping up all over the country in pharmacies, drugstores, and shopping centers. They're usually staffed by nurse practitioners or physician assistants that have been trained to treat common illnesses and complaints. They're usually fairly quick and inexpensive. However, if you have serious medical issues or chronic medical problems, these are probably not your best option. ° °No Primary Care Doctor: °- Call [facility] at  832-8000 - they can help you locate a primary care doctor that  accepts your insurance, provides certain services, etc. °- Physician Referral Service- 1-800-533-3463 ° °Chronic Pain Problems: °Organization         Address  Phone   Notes  °[facility]  (336) 297-2271 Patients need to be referred by their primary care doctor.  ° °Medication  Assistance: °Organization         Address  Phone   Notes  °[place] [facility] [address] °[place], [place] [address] (336) 641-8030 --Must be a resident of [place] °-- Must have NO insurance coverage whatsoever (no Medicaid/ Medicare, etc.) °-- The pt. MUST have a primary care doctor that directs their care regularly and follows them in the community °  °[facility]  (866) 331-1348   °[employer]  (888) 892-1162   ° °Agencies that provide inexpensive medical care: °Organization         Address  Phone   Notes  °[facility]  (336) 832-8035   °[facility]    (336) 832-7272   °[facility] [address] °[place], [place] [address] (336) 832-4777   °[facility] [address], °[place] (336) 271-4999   °[facility]    (336) 373-0678   °[facility]    (336) 272-1050   °[facility] ° [address], [place] Phone:  (336) 832-4444, Fax:  (336) 832-4440 Hours of Operation:  9 am - 6 pm, M-F.  Also accepts Medicaid/Medicare and self-pay.  °[facility] ° [address], [place] Phone: (336) 832-3150, Fax: (336) [phone]. Hours of Operation:  8:30 am - 5:30 pm, M-F.  Also accepts Medicaid and self-pay.  °[facility] [place] [address], [place] Phone: (336) 878-6027   °[facility] [address], [place], [place] (336)723-1848, Ext. 123 Mondays & Thursdays: 7-9 AM.  First 15 patients are seen on a first come, first serve basis. °  ° °Medicaid-accepting [place] Providers: ° °Organization         Address  Phone   Notes  °[facility] [address], [place] (336) 641-2100 Also accepts self-pay patients.  °[facility] [address], [place] ° (336) 856-9996   °[facility] [address], [place]  (336) 288-8857   °[facility] [address] [place] [address], [place] (336) 299-7000   °Veita Bland [address], [place]  ° (336) 373-1557 Only accepts [place] Access Medicaid patients after they have their name applied to their card.  ° °Self-Pay (no insurance) in [place]: ° °Organization         Address  Phone   Notes  °Sickle Cell Patients, [facility] [address], [place] (336) 832-1970   °[facility] [address], [place] (336) 832-4400   °[facility] ° [address], [place] (336) 992-4800   °[facility]/Dr. Osei-Bonsu ° [address] [place] [address], [place] or [address], [place] (336) 841-8500 Phone number for both [place] and [place] locations is the same.  °[facility] [address], [place] (336) 299-0000   °[facility] [address] [place] [address], [place] or [address] (336) 852-7530 °(336) 878-2260   °[facility] [address], [place] (336) 350-1642, phone; (336) 294-5005, fax Sees patients 1st and 3rd Saturday of every month.  Must not qualify for public or private insurance (i.e. Medicaid, Medicare, [place] Health Choice, Veterans' Benefits) • Household income should be no more than 200% of the poverty level •The clinic cannot treat you if you are pregnant or think you are pregnant • Sexually transmitted diseases are not treated at the clinic.  ° ° °Dental Care: °Organization         Address  Phone  Notes  °[place] [facility] [address], [place] (336) 641-6152 Accepts children up to age 21 who are enrolled in Medicaid or [place] Health Choice; pregnant women with a Medicaid card; and children who have applied for Medicaid or [place] Health Choice, but were declined, whose parents can pay a reduced fee at time of service.  °[place]  [facility] [place]  [address], [place] (336) 641-7733 Accepts children up to age 21 who are enrolled in Medicaid or [place] Health Choice; pregnant women with a Medicaid card; and children who have applied for Medicaid or [place] Health Choice, but were declined, whose parents can pay a reduced fee at time of service.  °[facility] ° [address], [place] (336) 641-4533 Patients are seen by appointment only. Walk-ins are not accepted. [facility] will see patients 18 years of age and older. °Monday - Tuesday (8am-5pm) °Most Wednesdays (8:30-5pm) °$30 per visit, cash only  °[facility] ° [address], [place] (336) 641-4533 Patients are seen by appointment only. Walk-ins are not accepted. [facility] will see patients 18 years of age and older. °One   Wednesday Evening (Monthly: Volunteer Based).  $30 per visit, cash only  °[facility]  (919) 537-3737 for adults; Children under age 4, call Graduate Pediatric Dentistry at (919) [phone]. Children aged 4-14, please call (919) [phone] to request a pediatric application. ° Dental services are provided in all areas of dental care including fillings, crowns and bridges, complete and partial dentures, implants, gum treatment, root canals, and extractions. Preventive care is also provided. Treatment is provided to both adults and children. °Patients are selected via a lottery and there is often a waiting list. °  °[facility] [address], °[place] ° (336) 763-8833 [email or web address] °  °[facility] [address], [place], [place] (336)723-1848, Ext. 123 Second and Fourth Thursday of each month, opens at 6:30 AM; Clinic ends at 9 AM.  Patients are seen on a first-come first-served basis, and a limited number are seen during each clinic.  ° °[facility] ° [address], [place], [place] (336) 723-7904    Eligibility Requirements °You must have lived in [place], [place], or [place] counties for at least the last three months. °  You cannot be eligible for state or federal sponsored healthcare insurance, including Veterans Administration, Medicaid, or Medicare. °  You generally cannot be eligible for healthcare insurance through your employer.  °  How to apply: °Eligibility screenings are held every Tuesday and Wednesday afternoon from 1:00 pm until 4:00 pm. You do not need an appointment for the interview!  °[facility] [address], [place], [place] 336-631-2330   °[facility]  336-342-8273   °[facility]  336-703-3100   °[facility]  336-570-6415   ° °Behavioral Health Resources in the Community: °Intensive Outpatient Programs °Organization         Address  Phone  Notes  °[facility] [address], [place], [place] 336-878-6098   °[facility] [address], [place], [place] 336-832-9800   °[facility] [address], [place], [place] ° 336-882-2125   °[facility] [address],  °[place], [place] 1-800-853-5163 or 336-641-4981   °Substance Abuse Resources °Organization         Address  Phone  Notes  °[facility]  336-882-2125   °[facility]  336-784-9470   °[facility]  336-285-9073   °[facility]  336-845-3988   °Residential & Outpatient Substance Abuse Program  1-800-659-3381   °Psychological Services °Organization         Address  Phone  Notes  °[facility]  336- 832-9600   °[facility]  336- 378-7881   °[facility] [address], [place] 1-800-853-5163 or 336-641-4981   ° °Mobile Crisis Teams °Organization         Address  Phone  Notes  °[facility], Mobile Crisis Care Unit  1-877-626-1772   °Assertive °[facility] ° [address]  [place], [place] 336-834-9664   °Sharon DeEsch [address] °[place] 336-554-5454   ° °Self-Help/Support Groups °Organization         Address  Phone             Notes  °[facility] of  - variety of support groups  336- 373-1402 Call for more information  °Narcotics Anonymous (NA), [facility] [address], °[place]  2 meetings at this location  ° °  Residential Treatment Programs Organization         Address  Phone  Notes  [facility] [address],    [place]  [phone]   [facility]  [address], [place] [phone], [place], [place] [phone]) [phone]   [facility] [address], [place] [phone] Admissions: 8am-3pm M-F  [facility] [address]    [place], [place] [phone]   [facility] [address], [place], [place] [phone]   [facility] [address]  [place], [place] [phone]   [facility] Intensive Outpatient [address], [place], [place] [phone]   [facility] ([facility] [address]  [place], [place] [phone])[phone] or ([phone]   [facility] ([facility] [address] [place], [place] [phone] Accepts Medicaid  [facility] [address]  [place] [phone] Substance Abuse/Addiction Treatment   [facility]         Address  Phone  Notes  [facility]  2566374659(888) 702-243-7512   Angie FavaJulie Brannon, PhD [address] [place], [place]   [phone](336) [phone] or [phone](336) [phone]   [facility]   [address] [place], [place] [phone](336) [phone]   [facility] [address], [place], [place] [phone](336) [phone] Insurance/Medicaid/sponsorship through [facility]  [facility] [address]                                    [place], [place] [phone](336) [phone] Therapy/tele-psych/case    [facility] [address]   [place], [place] [phone](336) [phone]    Dr. Lolly MustacheArfeen  [phone](336) [phone]   [facility]  [facility] 1) [address], [place] 2) [address], [place] 3)  [address], [place] [phone](336) [phone](336) [phone]  [phone](336) [phone]   [facility] Child Abuse Hotline 9897068814(336) (701) 273-5189 or [phone](336) (785)[phone] (After Hours)      Take over the counter tylenol, as directed on packaging, as needed for discomfort.  You will need to keep your head elevated for the next few days, especially at night, to help decrease the swelling around your eye and face area.  Apply moist head or ice several times per day for 20 minutes each time.  Call your regular medical doctor today to schedule a follow up appointment within the next 3 days. Return to the Emergency Department immediately if worsening.

## 2014-11-21 ENCOUNTER — Other Ambulatory Visit (HOSPITAL_COMMUNITY): Payer: Self-pay | Admitting: Family Medicine

## 2014-11-21 DIAGNOSIS — R001 Bradycardia, unspecified: Secondary | ICD-10-CM

## 2014-11-22 ENCOUNTER — Ambulatory Visit (HOSPITAL_COMMUNITY)
Admission: RE | Admit: 2014-11-22 | Discharge: 2014-11-22 | Disposition: A | Discharge status: Home / Self Care | Payer: Commercial Managed Care - HMO | Source: Ambulatory Visit | Attending: Family Medicine | Admitting: Family Medicine

## 2014-11-22 DIAGNOSIS — I440 Atrioventricular block, first degree: Secondary | ICD-10-CM | POA: Diagnosis not present

## 2014-11-22 DIAGNOSIS — R001 Bradycardia, unspecified: Secondary | ICD-10-CM | POA: Diagnosis not present

## 2014-11-22 DIAGNOSIS — I493 Ventricular premature depolarization: Secondary | ICD-10-CM | POA: Insufficient documentation

## 2014-11-25 ENCOUNTER — Encounter (HOSPITAL_COMMUNITY): Payer: Self-pay | Admitting: *Deleted

## 2014-11-25 ENCOUNTER — Emergency Department (HOSPITAL_COMMUNITY)
Admission: EM | Admit: 2014-11-25 | Discharge: 2014-11-25 | Disposition: A | Discharge status: Home / Self Care | Payer: Commercial Managed Care - HMO | Attending: Emergency Medicine | Admitting: Emergency Medicine

## 2014-11-25 DIAGNOSIS — Z8739 Personal history of other diseases of the musculoskeletal system and connective tissue: Secondary | ICD-10-CM | POA: Diagnosis not present

## 2014-11-25 DIAGNOSIS — Y929 Unspecified place or not applicable: Secondary | ICD-10-CM | POA: Diagnosis not present

## 2014-11-25 DIAGNOSIS — Y9389 Activity, other specified: Secondary | ICD-10-CM | POA: Diagnosis not present

## 2014-11-25 DIAGNOSIS — Z23 Encounter for immunization: Secondary | ICD-10-CM | POA: Diagnosis not present

## 2014-11-25 DIAGNOSIS — Y998 Other external cause status: Secondary | ICD-10-CM | POA: Diagnosis not present

## 2014-11-25 DIAGNOSIS — S80811A Abrasion, right lower leg, initial encounter: Secondary | ICD-10-CM | POA: Insufficient documentation

## 2014-11-25 DIAGNOSIS — I10 Essential (primary) hypertension: Secondary | ICD-10-CM | POA: Insufficient documentation

## 2014-11-25 DIAGNOSIS — Z87438 Personal history of other diseases of male genital organs: Secondary | ICD-10-CM | POA: Insufficient documentation

## 2014-11-25 DIAGNOSIS — W25XXXA Contact with sharp glass, initial encounter: Secondary | ICD-10-CM | POA: Insufficient documentation

## 2014-11-25 DIAGNOSIS — Z79899 Other long term (current) drug therapy: Secondary | ICD-10-CM | POA: Diagnosis not present

## 2014-11-25 DIAGNOSIS — T148XXA Other injury of unspecified body region, initial encounter: Secondary | ICD-10-CM

## 2014-11-25 MED ORDER — TETANUS-DIPHTH-ACELL PERTUSSIS 5-2.5-18.5 LF-MCG/0.5 IM SUSP
0.5000 mL | Freq: Once | INTRAMUSCULAR | Status: AC
  Administered 2014-11-25: 0.5 mL via INTRAMUSCULAR
  Filled 2014-11-25: qty 0.5

## 2014-11-25 NOTE — ED Notes (Signed)
Patient presents stating he hit his leg (right lower leg) on what he thought was the bed frame.  Noticed later that he had broken glass on the floor.  Cut is to the outer right lower leg.  Unsure when he had a tetanus shot

## 2014-11-25 NOTE — ED Notes (Signed)
Discharge instructions reviewed, voiced understanding.  

## 2014-11-25 NOTE — ED Provider Notes (Signed)
CSN: [id number]     Arrival date & time 11/25/14  4098 History   First MD Initiated Contact with Patient 11/25/14 [phone]     Chief Complaint  Patient presents with  . Extremity Laceration     (Consider location/radiation/quality/duration/timing/severity/associated sxs/prior Treatment) HPI Comments: Patient presents to the emergency department with chief complaint of abrasion. Patient states that he dropped a piece of glass on his leg 5 days ago. Patient states that he realized today that he needed to get a tetanus shot. He states that his doctor was closed so he came to the emergency department. He denies any other symptoms. He has been cleaning the wound with soap and water. He denies any discharge, or poor healing. Denies any fevers, chills, nausea, or vomiting. There are no aggravating factors.  The history is provided by the patient. No language interpreter was used.    Past Medical History  Diagnosis Date  . Enlarged prostate   . Hypertension   . Cold (disease)     last week ( week of Dec 22- Dec 29 ) - no longer coughing, no fever and feels better now  . BPH (benign prostatic hypertrophy)     problems with acute urinary retention- has indwelling foley catheter  . Arthritis     fingers and hands   Past Surgical History  Procedure Laterality Date  .  right elbow surgery as a child    . Prostatectomy  04/17/2012    Procedure: PROSTATECTOMY RETROPUBIC;  Surgeon: Lindaann Slough, MD;  Location: [facility];  Service: Urology;  Laterality: N/A;  Simple Retropubic Prostatectomy     No family history on file. Social History  Substance Use Topics  . Smoking status: Never Smoker   . Smokeless tobacco: Never Used  . Alcohol Use: Yes     Comment: occas    Review of Systems  Constitutional: Negative for fever and chills.  Respiratory: Negative for shortness of breath.   Cardiovascular: Negative for chest pain.  Gastrointestinal: Negative for nausea, vomiting, diarrhea and constipation.   Genitourinary: Negative for dysuria.  Skin: Positive for wound.      Allergies  Review of patient's allergies indicates no known allergies.  Home Medications   Prior to Admission medications   Medication Sig Start Date End Date Taking? Authorizing Provider  amLODipine (NORVASC) 10 MG tablet Take 10 mg by mouth daily. Every am    Historical Provider, MD  ciprofloxacin (CIPRO) 250 MG tablet Take 1 tablet (250 mg total) by mouth 2 (two) times daily. 04/20/12   Su Grand, MD  furosemide (LASIX) 20 MG tablet Take 20 mg by mouth every morning.    Historical Provider, MD  hydrALAZINE (APRESOLINE) 25 MG tablet Take 25 mg by mouth 2 (two) times daily.    Historical Provider, MD  ibuprofen (ADVIL,MOTRIN) 800 MG tablet Take 1 tablet (800 mg total) by mouth 3 (three) times daily. 04/23/14   Eber Hong, MD  metoprolol succinate (TOPROL-XL) 50 MG 24 hr tablet Take 50 mg by mouth every morning. Take with or immediately following a meal.    Historical Provider, MD  rosuvastatin (CRESTOR) 20 MG tablet Take 20 mg by mouth daily.    Historical Provider, MD  Tamsulosin HCl (FLOMAX) 0.4 MG CAPS Take 0.4 mg by mouth daily.    Historical Provider, MD  valsartan-hydrochlorothiazide (DIOVAN-HCT) 320-12.5 MG per tablet take 1 tablet by mouth once daily 11/12/13   Lyn Records, MD   BP 153/74 mmHg  Pulse 91  Temp(Src)  98.1 F (36.7 C) (Oral)  Resp 18  Ht  (1.651 m)  Wt 165 lb (74.844 kg)  BMI 27.46 kg/m2  SpO2 100% Physical Exam  Constitutional: He is oriented to person, place, and time. He appears well-developed and well-nourished.  HENT:  Head: Normocephalic and atraumatic.  Eyes: Conjunctivae and EOM are normal. Pupils are equal, round, and reactive to light. Right eye exhibits no discharge. Left eye exhibits no discharge. No scleral icterus.  Neck: Normal range of motion. Neck supple. No JVD present.  Cardiovascular: Normal rate, regular rhythm and normal heart sounds.  Exam reveals no gallop and  no friction rub.   No murmur heard. Pulmonary/Chest: Effort normal and breath sounds normal. No respiratory distress. He has no wheezes. He has no rales. He exhibits no tenderness.  CTAB  Abdominal: Soft. He exhibits no distension and no mass. There is no tenderness. There is no rebound and no guarding.  No focal abdominal tenderness, no RLQ tenderness or pain at McBurney's point, no RUQ tenderness or Murphy's sign, no left-sided abdominal tenderness, no fluid wave, or signs of peritonitis   Musculoskeletal: Normal range of motion. He exhibits no edema or tenderness.  Neurological: He is alert and oriented to person, place, and time.  Skin: Skin is warm and dry.  Mild abrasion to right lateral lower leg, no foreign body, no laceration, no discharge, drainage, or sign of infection  Psychiatric: He has a normal mood and affect. His behavior is normal. Judgment and thought content normal.  Nursing note and vitals reviewed.   ED Course  Procedures (including critical care time) Labs Review Labs Reviewed - No data to display  Imaging Review No results found. I, Carrigan Delafuente, personally reviewed and evaluated these images and lab results as part of my medical decision-making.   EKG Interpretation None      MDM   Final diagnoses:  Abrasion   Patient with mild abrasion to right lower leg, no laceration requiring repair, patient's only request/complaint is to get a tdap.   Tdap updated.  Bacitracin applied.  PCP follow-up.  Wound return precautions given.    Roxy Horseman, PA-C 11/25/14 0700  Derwood Kaplan, MD 11/25/14 [phone]

## 2014-11-25 NOTE — Discharge Instructions (Signed)

## 2014-11-25 NOTE — ED Notes (Signed)
Lac to right lower leg cleaned, Bacitracin applied with Bandaids

## 2014-12-02 DIAGNOSIS — I10 Essential (primary) hypertension: Secondary | ICD-10-CM | POA: Diagnosis not present

## 2014-12-02 DIAGNOSIS — E785 Hyperlipidemia, unspecified: Secondary | ICD-10-CM | POA: Diagnosis not present

## 2015-01-02 DIAGNOSIS — N401 Enlarged prostate with lower urinary tract symptoms: Secondary | ICD-10-CM | POA: Diagnosis not present

## 2015-01-02 DIAGNOSIS — R351 Nocturia: Secondary | ICD-10-CM | POA: Diagnosis not present

## 2015-05-05 ENCOUNTER — Encounter (HOSPITAL_COMMUNITY): Payer: Self-pay | Admitting: Emergency Medicine

## 2015-05-05 ENCOUNTER — Emergency Department (HOSPITAL_COMMUNITY)
Admission: EM | Admit: 2015-05-05 | Discharge: 2015-05-05 | Disposition: A | Discharge status: Home / Self Care | Payer: Commercial Managed Care - HMO | Attending: Emergency Medicine | Admitting: Emergency Medicine

## 2015-05-05 DIAGNOSIS — Z8709 Personal history of other diseases of the respiratory system: Secondary | ICD-10-CM | POA: Diagnosis not present

## 2015-05-05 DIAGNOSIS — M1990 Unspecified osteoarthritis, unspecified site: Secondary | ICD-10-CM | POA: Insufficient documentation

## 2015-05-05 DIAGNOSIS — Z760 Encounter for issue of repeat prescription: Secondary | ICD-10-CM

## 2015-05-05 DIAGNOSIS — I10 Essential (primary) hypertension: Secondary | ICD-10-CM | POA: Diagnosis not present

## 2015-05-05 DIAGNOSIS — Z792 Long term (current) use of antibiotics: Secondary | ICD-10-CM | POA: Diagnosis not present

## 2015-05-05 DIAGNOSIS — N400 Enlarged prostate without lower urinary tract symptoms: Secondary | ICD-10-CM | POA: Insufficient documentation

## 2015-05-05 DIAGNOSIS — Z79899 Other long term (current) drug therapy: Secondary | ICD-10-CM | POA: Insufficient documentation

## 2015-05-05 DIAGNOSIS — Z791 Long term (current) use of non-steroidal anti-inflammatories (NSAID): Secondary | ICD-10-CM | POA: Diagnosis not present

## 2015-05-05 MED ORDER — VALSARTAN-HYDROCHLOROTHIAZIDE 320-12.5 MG PO TABS: 1.0000 | ORAL_TABLET | Freq: Every day | ORAL | Status: DC

## 2015-05-05 NOTE — ED Notes (Signed)
Patient states his doctor passed away last week and he went to the pharmacy to get blood pressure medicine refilled.   Patient states he doesn't have another doctor.   Patient states not having any problems, just needs refill.   Patient is running a little high this morning, but he just took his bp meds.

## 2015-05-05 NOTE — ED Notes (Signed)
Pt ambulates without difficulty. No distress.

## 2015-05-05 NOTE — ED Provider Notes (Signed)
CSN: [id number]     Arrival date & time 05/05/15  0901 History  By signing my name below, I, Jesus Morrison, attest that this documentation has been prepared under the direction and in the presence of Jesus Fowler, PA-C Electronically Signed: Charline Bills, ED Scribe 05/05/2015 at 9:31 AM.   Chief Complaint  Patient presents with  . medication refills    The history is provided by the patient. No language interpreter was used.   HPI Comments: Jesus Morrison is a 82 y.o. male, with a h/o HTN, who presents to the Emergency Department for a medication refill of valsartan-hydrochlorothiazide 320-12.5 that he takes once daily. Pt states that his PCP passed away last week and he does not currently have another doctor. He denies any symptoms at this time.  Past Medical History  Diagnosis Date  . Enlarged prostate   . Hypertension   . Cold (disease)     last week ( week of Dec 22- Dec 29 ) - no longer coughing, no fever and feels better now  . BPH (benign prostatic hypertrophy)     problems with acute urinary retention- has indwelling foley catheter  . Arthritis     fingers and hands   Past Surgical History  Procedure Laterality Date  .  right elbow surgery as a child    . Prostatectomy  04/17/2012    Procedure: PROSTATECTOMY RETROPUBIC;  Surgeon: Lindaann Slough, MD;  Location: [facility];  Service: Urology;  Laterality: N/A;  Simple Retropubic Prostatectomy     No family history on file. Social History  Substance Use Topics  . Smoking status: Never Smoker   . Smokeless tobacco: Never Used  . Alcohol Use: Yes     Comment: socially    Review of Systems  All other systems negative unless otherwise stated in HPI  Allergies  Review of patient's allergies indicates no known allergies.  Home Medications   Prior to Admission medications   Medication Sig Start Date End Date Taking? Authorizing Provider  hydrALAZINE (APRESOLINE) 25 MG tablet Take 25 mg by mouth 2 (two) times daily.   Yes  Historical Provider, MD  valsartan-hydrochlorothiazide (DIOVAN-HCT) 320-12.5 MG per tablet take 1 tablet by mouth once daily 11/12/13  Yes Lyn Records, MD  amLODipine (NORVASC) 10 MG tablet Take 10 mg by mouth daily. Every am    Historical Provider, MD  ciprofloxacin (CIPRO) 250 MG tablet Take 1 tablet (250 mg total) by mouth 2 (two) times daily. 04/20/12   Su Grand, MD  furosemide (LASIX) 20 MG tablet Take 20 mg by mouth every morning.    Historical Provider, MD  ibuprofen (ADVIL,MOTRIN) 800 MG tablet Take 1 tablet (800 mg total) by mouth 3 (three) times daily. 04/23/14   Eber Hong, MD  metoprolol succinate (TOPROL-XL) 50 MG 24 hr tablet Take 50 mg by mouth every morning. Take with or immediately following a meal.    Historical Provider, MD  rosuvastatin (CRESTOR) 20 MG tablet Take 20 mg by mouth daily.    Historical Provider, MD  Tamsulosin HCl (FLOMAX) 0.4 MG CAPS Take 0.4 mg by mouth daily.    Historical Provider, MD  valsartan-hydrochlorothiazide (DIOVAN HCT) 320-12.5 MG tablet Take 1 tablet by mouth daily. 05/05/15   Keldric Poyer, PA-C   BP 187/78 mmHg  Pulse 58  Temp(Src) 97.4 F (36.3 C) (Oral)  Resp 18  Ht  (1.676 m)  Wt 184 lb (83.462 kg)  BMI 29.71 kg/m2  SpO2 99% Physical Exam  Constitutional:  He is oriented to person, place, and time. He appears well-developed and well-nourished. No distress.  HENT:  Head: Normocephalic and atraumatic.  Eyes: Conjunctivae and EOM are normal. No scleral icterus.  Neck: Neck supple. No tracheal deviation present.  Cardiovascular: Normal rate, regular rhythm and normal heart sounds.   Pulmonary/Chest: Effort normal and breath sounds normal. No respiratory distress. He has no wheezes. He has no rales.  Abdominal: Bowel sounds are normal. He exhibits no distension.  Musculoskeletal: Normal range of motion.  Neurological: He is alert and oriented to person, place, and time.  Skin: Skin is warm and dry.  Psychiatric: He has a normal mood and  affect. His behavior is normal.  Nursing note and vitals reviewed.  ED Course  Procedures (including critical care time) DIAGNOSTIC STUDIES: Oxygen Saturation is 99% on RA, normal by my interpretation.    COORDINATION OF CARE: 9:29 AM-Discussed treatment plan which includes medication refill of Diovan-HCT with pt at bedside and pt agreed to plan.   Labs Review Labs Reviewed - No data to display  Imaging Review No results found. I have personally reviewed and evaluated these images and lab results as part of my medical decision-making.   EKG Interpretation None      MDM   Final diagnoses:  Encounter for medication refill   Pt here for refill of medication. Medication is not a controlled substance. Will refill medication here. Discussed need to follow up with PCP in 2-3 days.  Pt is safe for discharge at this time.  New Prescriptions   VALSARTAN-HYDROCHLOROTHIAZIDE (DIOVAN HCT) 320-12.5 MG TABLET    Take 1 tablet by mouth daily.    I personally performed the services described in this documentation, which was scribed in my presence. The recorded information has been reviewed and is accurate.    Jesus Fowler, PA-C 05/05/15 1610  Laurence Spates, MD 05/05/15 [phone]

## 2015-05-05 NOTE — Discharge Instructions (Signed)
Medicine Refill at the Emergency Department °We have refilled your medicine today, but it is best for you to get refills through your primary health care provider's office. In the future, please plan ahead so you do not need to get refills from the emergency department. °If the medicine we refilled was a maintenance medicine, you may have received only enough to get you by until you are able to see your regular health care provider. °  °This information is not intended to replace advice given to you by your health care provider. Make sure you discuss any questions you have with your health care provider. °  °Document Released: 07/19/2003 Document Revised: 04/22/2014 Document Reviewed: 07/09/2013 °Elsevier Interactive Patient Education ©2016 Elsevier Inc. ° ° ° °Emergency Department Resource Guide °1) Find a Doctor and Pay Out of Pocket °Although you won't have to find out who is covered by your insurance plan, it is a good idea to ask around and get recommendations. You will then need to call the office and see if the doctor you have chosen will accept you as a new patient and what types of options they offer for patients who are self-pay. Some doctors offer discounts or will set up payment plans for their patients who do not have insurance, but you will need to ask so you aren't surprised when you get to your appointment. ° °2) Contact Your Local Health Department °Not all health departments have doctors that can see patients for sick visits, but many do, so it is worth a call to see if yours does. If you don't know where your local health department is, you can check in your phone book. The CDC also has a tool to help you locate your state's health department, and many state websites also have listings of all of their local health departments. ° °3) Find a Walk-in Clinic °If your illness is not likely to be very severe or complicated, you may want to try a walk in clinic. These are popping up all over the country  in pharmacies, drugstores, and shopping centers. They're usually staffed by nurse practitioners or physician assistants that have been trained to treat common illnesses and complaints. They're usually fairly quick and inexpensive. However, if you have serious medical issues or chronic medical problems, these are probably not your best option. ° °No Primary Care Doctor: °- Call [facility] at  832-8000 - they can help you locate a primary care doctor that  accepts your insurance, provides certain services, etc. °- Physician Referral Service- 1-800-533-3463 ° °Chronic Pain Problems: °Organization         Address  Phone   Notes  °[facility]  (336) 297-2271 Patients need to be referred by their primary care doctor.  ° °Medication Assistance: °Organization         Address  Phone   Notes  °[place] [facility] [address] °[place], [place] [address] (336) 641-8030 --Must be a resident of [place] °-- Must have NO insurance coverage whatsoever (no Medicaid/ Medicare, etc.) °-- The pt. MUST have a primary care doctor that directs their care regularly and follows them in the community °  °[facility]  (866) 331-1348   °[employer]  (888) 892-1162   ° °Agencies that provide inexpensive medical care: °Organization         Address  Phone   Notes  °[facility]  (336) 832-8035   °[facility]    (  336) 832-7272   °[facility] [address] °[place], [place] [address] (336) 832-4777   °[facility] [address], °[place] (336) 271-4999   °[facility]    (336) 373-0678   °[facility]    (336) 272-1050   °[facility] ° [address], [place] Phone:  (336) 832-4444, Fax:  (336) 832-4440 Hours of Operation:  9 am - 6 pm, M-F.  Also accepts Medicaid/Medicare and self-pay.  °[facility] ° [address],  [place] Phone: (336) 832-3150, Fax: (336) [phone]. Hours of Operation:  8:30 am - 5:30 pm, M-F.  Also accepts Medicaid and self-pay.  °[facility] [place] [address], [place] Phone: (336) 878-6027   °[facility] [address], [place], [place] (336)723-1848, Ext. 123 Mondays & Thursdays: 7-9 AM.  First 15 patients are seen on a first come, first serve basis. °  ° °Medicaid-accepting [place] Providers: ° °Organization         Address  Phone   Notes  °[facility] [address], [place] (336) 641-2100 Also accepts self-pay patients.  °[facility] [address], [place] ° (336) 856-9996   °[facility] [address], [place] (336) 288-8857   °[facility] [address] [place] [address], [place] (336) 299-7000   °Veita Bland [address], [place]  ° (336) 373-1557 Only accepts [place] Access Medicaid patients after they have their name applied to their card.  ° °Self-Pay (no insurance) in [place]: ° °Organization         Address  Phone   Notes  °Sickle Cell Patients, [facility] [address], [place] (336) 832-1970   °[facility] [address], [place] (336) 832-4400   °[facility] [place] ° [address], [place] (336) 992-4800   °[facility]/Dr. Osei-Bonsu ° [address] [place] [address], [place] or [address], [place] (336) 841-8500 Phone number for both [place] and [place] locations is the same.  °[facility] [address], [place] (336) 299-0000   °[facility] [address] [place] [address], [place] or [address] (336) 852-7530 °(336) 878-2260   °[facility] [address], [place] (336) 350-1642, phone; (336) 294-5005, fax Sees patients 1st and 3rd Saturday of every month.  Must not  qualify for public or private insurance (i.e. Medicaid, Medicare, [place] Health Choice, Veterans' Benefits) • Household income should be no more than 200% of the poverty level •The clinic cannot treat you if you are pregnant or think you are pregnant • Sexually transmitted diseases are not treated at the clinic.  ° ° °Dental Care: °Organization         Address  Phone  Notes  °[facility] [address],  (336) 641-6152 Accepts children up to age 21 who are enrolled in Medicaid or [place] Health Choice; pregnant women with a Medicaid card; and children who have applied for Medicaid or [place] Health Choice, but were declined, whose parents can pay a reduced fee at time of service.  °[facility] [place]  [address], [place] (336) 641-7733 Accepts children up   to age 21 who are enrolled in Medicaid or [place] Health Choice; pregnant women with a Medicaid card; and children who have applied for Medicaid or [place] Health Choice, but were declined, whose parents can pay a reduced fee at time of service.  °[facility] ° [address], [place] (336) 641-4533 Patients are seen by appointment only. Walk-ins are not accepted. [facility] will see patients 18 years of age and older. °Monday - Tuesday (8am-5pm) °Most Wednesdays (8:30-5pm) °$30 per visit, cash only  °[facility] ° [address], [place] (336) 641-4533 Patients are seen by appointment only. Walk-ins are not accepted. [facility] will see patients 18 years of age and older. °One Wednesday Evening (Monthly: Volunteer Based).  $30 per visit, cash only  °[facility]  (919) 537-3737 for adults; Children under age 4, call Graduate Pediatric Dentistry at (919) [phone]. Children aged 4-14, please call (919) [phone] to request a pediatric application. ° Dental services are provided  in all areas of dental care including fillings, crowns and bridges, complete and partial dentures, implants, gum treatment, root canals, and extractions. Preventive care is also provided. Treatment is provided to both adults and children. °Patients are selected via a lottery and there is often a waiting list. °  °[facility] [address], °[place] ° (336) 763-8833 [email or web address] °  °[facility] [address], [place], [place] (336)723-1848, Ext. 123 Second and Fourth Thursday of each month, opens at 6:30 AM; Clinic ends at 9 AM.  Patients are seen on a first-come first-served basis, and a limited number are seen during each clinic.  ° °[facility] ° [address], [place], [place] (336) 723-7904   Eligibility Requirements °You must have lived in [place], or [place] counties for at least the last three months. °  You cannot be eligible for state or federal sponsored healthcare insurance, including Veterans Administration, Medicaid, or Medicare. °  You generally cannot be eligible for healthcare insurance through your employer.  °  How to apply: °Eligibility screenings are held every Tuesday and Wednesday afternoon from 1:00 pm until 4:00 pm. You do not need an appointment for the interview!  °[facility] [address], [place], [place] 336-631-2330   °[facility]  336-342-8273   °[facility]  336-703-3100   °[facility]  336-570-6415   ° °Behavioral Health Resources in the Community: °Intensive Outpatient Programs °Organization         Address  Phone  Notes  °[facility] [address], [place], [place] 336-878-6098   °[facility] [address], [place], [place] 336-832-9800   °[facility] [address], [place], [place] ° 336-882-2125   °[facility] [address],  °[place], [place]  1-800-853-5163 or 336-641-4981   °Substance Abuse Resources °Organization         Address  Phone  Notes  °[facility]  336-882-2125   °[facility]  336-784-9470   °[facility] [place] [facility]  336-285-9073   °[facility]  336-845-3988   °Residential & Outpatient Substance Abuse Program  1-800-659-3381   °Psychological Services °Organization         Address  Phone  Notes  °[facility]  336- 832-9600   °[facility]  336- 378-7881   °[facility]   [address], [place] 1-800-853-5163 or 336-641-4981   ° °Mobile Crisis Teams °Organization         Address  Phone  Notes  °[facility], Mobile Crisis Care Unit  1-877-626-1772   °Assertive °[facility] ° [address] [place], [place] 336-834-9664   °Sharon DeEsch [address] °[place] 336-554-5454   ° °Self-Help/Support Groups °Organization         Address  Phone             Notes  °[facility] - variety of support groups  336- 373-1402 Call for more information  °Narcotics Anonymous (NA), [facility] [address], °[place]  2 meetings at this location  ° °Residential Treatment Programs °Organization         Address  Phone  Notes  °[facility] [address],    °[place]  1-866-801-8205   °[facility] ° [address], [place], [place] 704-293-8524   °[facility] [address], [place] 336-845-3988 Admissions: 8am-3pm M-F  °[facility] [address]    °[place], [place] 336-841-1104   °[facility] [address], [place], [place] 336-379-7146   °[facility] [place] [facility] [address]  °[place], [place] 336-285-9073   °[facility] Intensive Outpatient [address], [place], [place] 336-852-3033   °[facility] [address]  °[place], [place] 1-877-615-2722 or  336-784-9470   °[facility] [address] [place], [place] 336-227-7417 Accepts Medicaid  °[facility] [address]  °[place] 1-800-659-3381 Substance Abuse/Addiction Treatment  ° °[place] Behavioral Health Resources °Organization         Address  Phone  Notes  °[facility]  (888) 581-9988   °Julie Brannon, PhD [address] [place], [place]   (336) 349-5553 or (336) 951-0000   ° Behavioral   [address] °[place], [place] (336) 349-4454   °[facility] [address], [place], [place] (336) 342-8316 Insurance/Medicaid/sponsorship through [facility]  °[facility] [address]                                    [place], [place] (336) 342-8316 Therapy/tele-psych/case  °[facility] [address]  ° [place], [place] (336) 349-2233    °Dr. Arfeen  (336) 349-4544   °[facility] [place]  [employer] [place] [facility] 1) [address], [place] °2) [address], [place] °3)  [address], [place] (336) 349-3220 °(336) 342-7768 ° °(336) 342-8140   °[place] [facility] (336) 342-1394 or (336) 342-3537 (After Hours)    ° ° ° °

## 2015-06-07 ENCOUNTER — Emergency Department (HOSPITAL_COMMUNITY)
Admission: EM | Admit: 2015-06-07 | Discharge: 2015-06-07 | Disposition: A | Discharge status: Home / Self Care | Payer: Commercial Managed Care - HMO | Attending: Emergency Medicine | Admitting: Emergency Medicine

## 2015-06-07 ENCOUNTER — Encounter (HOSPITAL_COMMUNITY): Payer: Self-pay | Admitting: Emergency Medicine

## 2015-06-07 DIAGNOSIS — K5900 Constipation, unspecified: Secondary | ICD-10-CM | POA: Insufficient documentation

## 2015-06-07 DIAGNOSIS — Z791 Long term (current) use of non-steroidal anti-inflammatories (NSAID): Secondary | ICD-10-CM | POA: Insufficient documentation

## 2015-06-07 DIAGNOSIS — N400 Enlarged prostate without lower urinary tract symptoms: Secondary | ICD-10-CM | POA: Diagnosis not present

## 2015-06-07 DIAGNOSIS — Z760 Encounter for issue of repeat prescription: Secondary | ICD-10-CM | POA: Diagnosis not present

## 2015-06-07 DIAGNOSIS — Z79899 Other long term (current) drug therapy: Secondary | ICD-10-CM | POA: Insufficient documentation

## 2015-06-07 DIAGNOSIS — R001 Bradycardia, unspecified: Secondary | ICD-10-CM | POA: Insufficient documentation

## 2015-06-07 DIAGNOSIS — I10 Essential (primary) hypertension: Secondary | ICD-10-CM | POA: Insufficient documentation

## 2015-06-07 DIAGNOSIS — Z792 Long term (current) use of antibiotics: Secondary | ICD-10-CM | POA: Diagnosis not present

## 2015-06-07 DIAGNOSIS — M1990 Unspecified osteoarthritis, unspecified site: Secondary | ICD-10-CM | POA: Diagnosis not present

## 2015-06-07 DIAGNOSIS — R011 Cardiac murmur, unspecified: Secondary | ICD-10-CM | POA: Insufficient documentation

## 2015-06-07 MED ORDER — HYDRALAZINE HCL 25 MG PO TABS: 25.0000 mg | ORAL_TABLET | Freq: Two times a day (BID) | ORAL | Status: DC

## 2015-06-07 MED ORDER — POLYETHYLENE GLYCOL 3350 17 GM/SCOOP PO POWD: 17.0000 g | Freq: Every day | ORAL | Status: DC

## 2015-06-07 MED ORDER — VALSARTAN-HYDROCHLOROTHIAZIDE 320-12.5 MG PO TABS: 1.0000 | ORAL_TABLET | Freq: Every day | ORAL | Status: DC

## 2015-06-07 NOTE — ED Notes (Signed)
Pt from pharmacy, states has no refills left for HTN meds and PCP Dr. Bruna Potter passed so he needs a new PCP and also refill rx for HTN meds, pt denies any headache or complains of anything at this time.

## 2015-06-07 NOTE — Discharge Instructions (Signed)
Constipation, Adult Constipation is when a person has fewer than three bowel movements a week, has difficulty having a bowel movement, or has stools that are dry, hard, or larger than normal. As people grow older, constipation is more common. A low-fiber diet, not taking in enough fluids, and taking certain medicines may make constipation worse.  CAUSES   Certain medicines, such as antidepressants, pain medicine, iron supplements, antacids, and water pills.   Certain diseases, such as diabetes, irritable bowel syndrome (IBS), thyroid disease, or depression.   Not drinking enough water.   Not eating enough fiber-rich foods.   Stress or travel.   Lack of physical activity or exercise.   Ignoring the urge to have a bowel movement.   Using laxatives too much.  SIGNS AND SYMPTOMS   Having fewer than three bowel movements a week.   Straining to have a bowel movement.   Having stools that are hard, dry, or larger than normal.   Feeling full or bloated.   Pain in the lower abdomen.   Not feeling relief after having a bowel movement.  DIAGNOSIS  Your health care provider will take a medical history and perform a physical exam. Further testing may be done for severe constipation. Some tests may include:  A barium enema X-ray to examine your rectum, colon, and, sometimes, your small intestine.   A sigmoidoscopy to examine your lower colon.   A colonoscopy to examine your entire colon. TREATMENT  Treatment will depend on the severity of your constipation and what is causing it. Some dietary treatments include drinking more fluids and eating more fiber-rich foods. Lifestyle treatments may include regular exercise. If these diet and lifestyle recommendations do not help, your health care provider may recommend taking over-the-counter laxative medicines to help you have bowel movements. Prescription medicines may be prescribed if over-the-counter medicines do not work.    HOME CARE INSTRUCTIONS   Eat foods that have a lot of fiber, such as fruits, vegetables, whole grains, and beans.  Limit foods high in fat and processed sugars, such as french fries, hamburgers, cookies, candies, and soda.   A fiber supplement may be added to your diet if you cannot get enough fiber from foods.   Drink enough fluids to keep your urine clear or pale yellow.   Exercise regularly or as directed by your health care provider.   Go to the restroom when you have the urge to go. Do not hold it.   Only take over-the-counter or prescription medicines as directed by your health care provider. Do not take other medicines for constipation without talking to your health care provider first.  SEEK IMMEDIATE MEDICAL CARE IF:   You have bright red blood in your stool.   Your constipation lasts for more than 4 days or gets worse.   You have abdominal or rectal pain.   You have thin, pencil-like stools.   You have unexplained weight loss. MAKE SURE YOU:   Understand these instructions.  Will watch your condition.  Will get help right away if you are not doing well or get worse.   This information is not intended to replace advice given to you by your health care provider. Make sure you discuss any questions you have with your health care provider.   Document Released: 12/29/2003 Document Revised: 04/22/2014 Document Reviewed: 01/11/2013 Elsevier Interactive Patient Education [employer]  Medicine Refill at the Emergency Department We have refilled your medicine today, but it is best for you to  get refills through your primary health care provider's office. In the future, please plan ahead so you do not need to get refills from the emergency department. If the medicine we refilled was a maintenance medicine, you may have received only enough to get you by until you are able to see your regular health care provider.   This information is not intended to  replace advice given to you by your health care provider. Make sure you discuss any questions you have with your health care provider.   Document Released: 07/19/2003 Document Revised: 04/22/2014 Document Reviewed: 07/09/2013 Elsevier Interactive Patient Education [employer]

## 2015-06-07 NOTE — ED Notes (Signed)
Patient reports no problems.   Patient states needs primary doctor and medication refill.

## 2015-06-07 NOTE — ED Provider Notes (Signed)
CSN: [id number]     Arrival date & time 06/07/15  1334 History  By signing my name below, I, Jesus Morrison, attest that this documentation has been prepared under the direction and in the presence of [name], PA-C. Electronically Signed: [name], ED Scribe. 06/07/2015. 3:41 PM.   Chief Complaint  Patient presents with  . Medication Refill   The history is provided by the patient. No language interpreter was used.     HPI Comments: Jesus Morrison is a 82 y.o. male with hx HTN who presents to the Emergency Department for medication refill of 25 mg Hydralazine and 12.5 mg Valsartan-HCTZ. Pt is not out of his prescriptions yet but he does report he only has a couple of pills left. Pt's PCP recently passed away and he needs to fine another PCP to write his prescriptions. Pt states that his blood pressure has been running "alright." The only complaint pt has at this time is mild constipation and abdominal distension. Denies chest pain, headache, shortness of breath, leg swelling, visual changes, or any other associated symptoms.    Past Medical History  Diagnosis Date  . Enlarged prostate   . Hypertension   . Cold (disease)     last week ( week of Dec 22- Dec 29 ) - no longer coughing, no fever and feels better now  . BPH (benign prostatic hypertrophy)     problems with acute urinary retention- has indwelling foley catheter  . Arthritis     fingers and hands   Past Surgical History  Procedure Laterality Date  .  right elbow surgery as a child    . Prostatectomy  04/17/2012    Procedure: PROSTATECTOMY RETROPUBIC;  Surgeon: Lindaann Slough, MD;  Location: [facility];  Service: Urology;  Laterality: N/A;  Simple Retropubic Prostatectomy     No family history on file. Social History  Substance Use Topics  . Smoking status: Never Smoker   . Smokeless tobacco: Never Used  . Alcohol Use: Yes     Comment: socially    Review of Systems  Constitutional: Negative.   HENT: Negative.    Eyes: Negative for visual disturbance.  Respiratory: Negative.  Negative for shortness of breath.   Cardiovascular: Negative.  Negative for chest pain and leg swelling.  Gastrointestinal: Positive for constipation. Negative for nausea, vomiting, abdominal pain, diarrhea and abdominal distention.  Skin: Negative.   Neurological: Negative for headaches.  Psychiatric/Behavioral: Negative.   All other systems reviewed and are negative.  Allergies  Review of patient's allergies indicates no known allergies.  Home Medications   Prior to Admission medications   Medication Sig Start Date End Date Taking? Authorizing Provider  amLODipine (NORVASC) 10 MG tablet Take 10 mg by mouth daily. Every am    Historical Provider, MD  ciprofloxacin (CIPRO) 250 MG tablet Take 1 tablet (250 mg total) by mouth 2 (two) times daily. 04/20/12   Su Grand, MD  furosemide (LASIX) 20 MG tablet Take 20 mg by mouth every morning.    Historical Provider, MD  hydrALAZINE (APRESOLINE) 25 MG tablet Take 25 mg by mouth 2 (two) times daily.    Historical Provider, MD  ibuprofen (ADVIL,MOTRIN) 800 MG tablet Take 1 tablet (800 mg total) by mouth 3 (three) times daily. 04/23/14   Eber Hong, MD  metoprolol succinate (TOPROL-XL) 50 MG 24 hr tablet Take 50 mg by mouth every morning. Take with or immediately following a meal.    Historical Provider, MD  rosuvastatin (CRESTOR) 20 MG  tablet Take 20 mg by mouth daily.    Historical Provider, MD  Tamsulosin HCl (FLOMAX) 0.4 MG CAPS Take 0.4 mg by mouth daily.    Historical Provider, MD  valsartan-hydrochlorothiazide (DIOVAN HCT) 320-12.5 MG tablet Take 1 tablet by mouth daily. 05/05/15   Cheri Fowler, PA-C  valsartan-hydrochlorothiazide (DIOVAN-HCT) 320-12.5 MG per tablet take 1 tablet by mouth once daily 11/12/13   Lyn Records, MD   BP 176/61 mmHg  Pulse 58  Temp(Src) 98 F (36.7 C) (Oral)  Resp 16  Ht 5\' 5"  (1.651 m)  Wt 180 lb (81.647 kg)  BMI 29.95 kg/m2  SpO2 95%   Physical  Exam  Constitutional: He is oriented to person, place, and time. He appears well-developed and well-nourished. No distress.  HENT:  Head: Normocephalic and atraumatic.  Nose: Nose normal.  Mouth/Throat: Oropharynx is clear and moist. No oropharyngeal exudate.  Eyes: Conjunctivae and EOM are normal. Pupils are equal, round, and reactive to light. Right eye exhibits no discharge. Left eye exhibits no discharge. No scleral icterus.  Neck: Normal range of motion. Neck supple. No JVD present. No tracheal deviation present. No thyromegaly present.  Cardiovascular: Regular rhythm, intact distal pulses and normal pulses.  Frequent extrasystoles are present. Bradycardia present.  Exam reveals no gallop and no friction rub.   Murmur heard.  Systolic murmur is present  Systolic murmur heard over the aortic area No lower extremity edema, symmetrical peripheral pulses radial 2+, posterior tibialis 2+  Pulmonary/Chest: Effort normal and breath sounds normal. No respiratory distress. He has no wheezes. He has no rales. He exhibits no tenderness.  Abdominal: Soft. Bowel sounds are normal. He exhibits no distension and no mass. There is no tenderness. There is no rebound and no guarding.  Musculoskeletal: Normal range of motion. He exhibits no edema or tenderness.  Lymphadenopathy:    He has no cervical adenopathy.  Neurological: He is alert and oriented to person, place, and time. He has normal reflexes. No cranial nerve deficit. He exhibits normal muscle tone. Coordination normal.  Skin: Skin is warm and dry. No rash noted. He is not diaphoretic. No erythema. No pallor.  Psychiatric: He has a normal mood and affect. His behavior is normal. Judgment and thought content normal.  Nursing note and vitals reviewed.   ED Course  Procedures (including critical care time)  DIAGNOSTIC STUDIES: Oxygen Saturation is 95% on RA, adequate by my interpretation.    COORDINATION OF CARE: 3:38 PM-Discussed treatment  plan which includes Rx HTN medication with pt at bedside and pt agreed to plan.   Labs Review Labs Reviewed - No data to display  Imaging Review No results found.   EKG Interpretation None      MDM  Pt presents for medication refill.  He complains of mild constipation, but has no acute complaints.  He has been taking medications for over a year, and his PCP passed away.   Patient has no acute complaints, his blood pressures mildly elevated. He did not take his valsartan/HCTZ this morning.  He does not have any headache, chest pain, shortness of breath, lower extremity edema, abdominal pain.   He was encouraged to establish a new PCP with his age and chronic medical problems. Patient was discussed with Dr. Rhunette Croft.  Referral to [facility] urgent care/family practice given. Medications refilled for 2 months.  Chart history reviewed.  Pt was discharged home in good condition  Final diagnoses:  None  I personally performed the services described in this documentation,  which was scribed in my presence. The recorded information has been reviewed and is accurate.        [name], PA-C 06/07/15 1658  Derwood Kaplan, MD 06/08/15 [phone]

## 2015-06-07 NOTE — ED Notes (Signed)
Declined W/C at D/C and was escorted to lobby by RN. 

## 2015-07-12 ENCOUNTER — Emergency Department (HOSPITAL_COMMUNITY)
Admission: EM | Admit: 2015-07-12 | Discharge: 2015-07-12 | Disposition: A | Discharge status: Home / Self Care | Payer: Commercial Managed Care - HMO | Attending: Emergency Medicine | Admitting: Emergency Medicine

## 2015-07-12 ENCOUNTER — Encounter (HOSPITAL_COMMUNITY): Payer: Self-pay | Admitting: Emergency Medicine

## 2015-07-12 DIAGNOSIS — Z79899 Other long term (current) drug therapy: Secondary | ICD-10-CM | POA: Insufficient documentation

## 2015-07-12 DIAGNOSIS — Z792 Long term (current) use of antibiotics: Secondary | ICD-10-CM | POA: Insufficient documentation

## 2015-07-12 DIAGNOSIS — N400 Enlarged prostate without lower urinary tract symptoms: Secondary | ICD-10-CM | POA: Diagnosis not present

## 2015-07-12 DIAGNOSIS — M19042 Primary osteoarthritis, left hand: Secondary | ICD-10-CM | POA: Insufficient documentation

## 2015-07-12 DIAGNOSIS — Z791 Long term (current) use of non-steroidal anti-inflammatories (NSAID): Secondary | ICD-10-CM | POA: Insufficient documentation

## 2015-07-12 DIAGNOSIS — Z8709 Personal history of other diseases of the respiratory system: Secondary | ICD-10-CM | POA: Insufficient documentation

## 2015-07-12 DIAGNOSIS — M19041 Primary osteoarthritis, right hand: Secondary | ICD-10-CM | POA: Diagnosis not present

## 2015-07-12 DIAGNOSIS — I10 Essential (primary) hypertension: Secondary | ICD-10-CM | POA: Insufficient documentation

## 2015-07-12 DIAGNOSIS — Z711 Person with feared health complaint in whom no diagnosis is made: Secondary | ICD-10-CM | POA: Diagnosis not present

## 2015-07-12 DIAGNOSIS — Z0130 Encounter for examination of blood pressure without abnormal findings: Secondary | ICD-10-CM | POA: Diagnosis present

## 2015-07-12 NOTE — ED Notes (Signed)
Patient here with no complaints.

## 2015-07-12 NOTE — ED Provider Notes (Signed)
CSN: [id number]     Arrival date & time 07/12/15  1332 History  By signing my name below, I, Freida Busman, attest that this documentation has been prepared under the direction and in the presence of non-physician practitioner, Fayrene Helper, PA-C. Electronically Signed: Freida Busman, Scribe. 07/12/2015. 2:57 PM.  Chief Complaint  Patient presents with  . blood pressure check    The history is provided by the patient and medical records. No language interpreter was used.     HPI Comments:  Jesus Morrison is a 82 y.o. male who presents to the Emergency Department for BP check. Pt states he had 2 loose BMs this AM which was unusual for him. Because of this he wasn't sure if he should take his BP meds so he did not. Pt is currently taking  Valsartan- HCTZ and hydralzine for his BP which were prescribed to him in the ED on 06/07/15. Pt's PCP recently passed away.  During his recent ED visit he was urged to follow up with urgent care to establish new PCP but pt went when they were closed. At this time he denies dizziness, lightheadedness, abdominal pain, rectal pain with BM and HA. No alleviating factors noted.  Past Medical History  Diagnosis Date  . Enlarged prostate   . Hypertension   . Cold (disease)     last week ( week of Dec 22- Dec 29 ) - no longer coughing, no fever and feels better now  . BPH (benign prostatic hypertrophy)     problems with acute urinary retention- has indwelling foley catheter  . Arthritis     fingers and hands   Past Surgical History  Procedure Laterality Date  .  right elbow surgery as a child    . Prostatectomy  04/17/2012    Procedure: PROSTATECTOMY RETROPUBIC;  Surgeon: Lindaann Slough, MD;  Location: [facility];  Service: Urology;  Laterality: N/A;  Simple Retropubic Prostatectomy     History reviewed. No pertinent family history. Social History  Substance Use Topics  . Smoking status: Never Smoker   . Smokeless tobacco: Never Used  . Alcohol Use: Yes      Comment: socially    Review of Systems  Constitutional:       + BP check  Gastrointestinal: Negative for abdominal pain.  Neurological: Negative for dizziness, light-headedness and headaches.   Allergies  Review of patient's allergies indicates no known allergies.  Home Medications   Prior to Admission medications   Medication Sig Start Date End Date Taking? Authorizing Provider  amLODipine (NORVASC) 10 MG tablet Take 10 mg by mouth daily. Every am    Historical Provider, MD  ciprofloxacin (CIPRO) 250 MG tablet Take 1 tablet (250 mg total) by mouth 2 (two) times daily. 04/20/12   Su Grand, MD  furosemide (LASIX) 20 MG tablet Take 20 mg by mouth every morning.    Historical Provider, MD  hydrALAZINE (APRESOLINE) 25 MG tablet Take 1 tablet (25 mg total) by mouth 2 (two) times daily. 06/07/15   [name], PA-C  ibuprofen (ADVIL,MOTRIN) 800 MG tablet Take 1 tablet (800 mg total) by mouth 3 (three) times daily. 04/23/14   Eber Hong, MD  metoprolol succinate (TOPROL-XL) 50 MG 24 hr tablet Take 50 mg by mouth every morning. Take with or immediately following a meal.    Historical Provider, MD  polyethylene glycol powder (GLYCOLAX/MIRALAX) powder Take 17 g by mouth daily. 06/07/15   [name], PA-C  rosuvastatin (CRESTOR) 20 MG tablet Take 20  mg by mouth daily.    Historical Provider, MD  Tamsulosin HCl (FLOMAX) 0.4 MG CAPS Take 0.4 mg by mouth daily.    Historical Provider, MD  valsartan-hydrochlorothiazide (DIOVAN HCT) 320-12.5 MG tablet Take 1 tablet by mouth daily. 06/07/15   Danelle BerryLeisa Tapia, PA-C   BP 147/86 mmHg  Pulse 55  Temp(Src) 98.9 F (37.2 C) (Oral)  Resp 17  SpO2 97% Physical Exam  Constitutional: He is oriented to person, place, and time. He appears well-developed and well-nourished. No distress.  HENT:  Head: Normocephalic and atraumatic.  Eyes: Conjunctivae are normal.  Cardiovascular: Normal rate, regular rhythm and normal heart sounds.   Pulmonary/Chest: Effort normal  and breath sounds normal. No respiratory distress.  Abdominal: He exhibits no distension.  Neurological: He is alert and oriented to person, place, and time.  Skin: Skin is warm and dry.  Psychiatric: He has a normal mood and affect.  Nursing note and vitals reviewed.   ED Course  Procedures   DIAGNOSTIC STUDIES:  Oxygen Saturation is 97% on RA, normal by my interpretation.    COORDINATION OF CARE:  2:50 PM Discussed treatment plan with pt at bedside and pt agreed to plan.  Pt was seen int he ER for medication refill less than a month ago.  He's here today asking if he should take her BP medication.  He's on several BP meds at this time.  His BP is stable, with mild bradycardia.  I recommend pt to establish care at [facility] as he was recently recommended.    MDM   . Final diagnoses:  Physically well but worried    BP 147/86 mmHg  Pulse 55  Temp(Src) 98.9 F (37.2 C) (Oral)  Resp 17  SpO2 97%  I personally performed the services described in this documentation, which was scribed in my presence. The recorded information has been reviewed and is accurate.      Fayrene HelperBowie Kaya Pottenger, PA-C 07/12/15 1502  Vanetta MuldersScott Zackowski, MD 07/13/15 [phone]

## 2015-07-12 NOTE — ED Notes (Signed)
Pt sts hx PCP has passed away and he needed his blood pressure checked today because he had 2 BM this am and is on meds for that but was afraid to take any of him medication today; no distress noted

## 2015-07-12 NOTE — Discharge Instructions (Signed)
Please follow up with [facility] to establish new patient care through them for further management of your health.  Take your medications as prescribed. You may hold off from taking Hydralazine this week until you can be seen by your new provider.

## 2015-08-04 ENCOUNTER — Emergency Department (HOSPITAL_COMMUNITY)
Admission: EM | Admit: 2015-08-04 | Discharge: 2015-08-04 | Disposition: A | Discharge status: Home / Self Care | Payer: Commercial Managed Care - HMO | Attending: Emergency Medicine | Admitting: Emergency Medicine

## 2015-08-04 ENCOUNTER — Encounter (HOSPITAL_COMMUNITY): Payer: Self-pay | Admitting: *Deleted

## 2015-08-04 DIAGNOSIS — Z792 Long term (current) use of antibiotics: Secondary | ICD-10-CM | POA: Insufficient documentation

## 2015-08-04 DIAGNOSIS — N400 Enlarged prostate without lower urinary tract symptoms: Secondary | ICD-10-CM | POA: Insufficient documentation

## 2015-08-04 DIAGNOSIS — Z791 Long term (current) use of non-steroidal anti-inflammatories (NSAID): Secondary | ICD-10-CM | POA: Insufficient documentation

## 2015-08-04 DIAGNOSIS — I160 Hypertensive urgency: Secondary | ICD-10-CM | POA: Diagnosis not present

## 2015-08-04 DIAGNOSIS — Z79899 Other long term (current) drug therapy: Secondary | ICD-10-CM | POA: Diagnosis not present

## 2015-08-04 DIAGNOSIS — Z8709 Personal history of other diseases of the respiratory system: Secondary | ICD-10-CM | POA: Diagnosis not present

## 2015-08-04 DIAGNOSIS — Z760 Encounter for issue of repeat prescription: Secondary | ICD-10-CM | POA: Diagnosis not present

## 2015-08-04 DIAGNOSIS — Z8739 Personal history of other diseases of the musculoskeletal system and connective tissue: Secondary | ICD-10-CM | POA: Diagnosis not present

## 2015-08-04 DIAGNOSIS — I10 Essential (primary) hypertension: Secondary | ICD-10-CM | POA: Diagnosis present

## 2015-08-04 LAB — CBC WITH DIFFERENTIAL/PLATELET
BASOS ABS: 0 10*3/uL (ref 0.0–0.1)
Basophils Relative: 1 %
EOS PCT: 4 %
Eosinophils Absolute: 0.3 10*3/uL (ref 0.0–0.7)
HEMATOCRIT: 41.1 % (ref 39.0–52.0)
Hemoglobin: 12.8 g/dL — ABNORMAL LOW (ref 13.0–17.0)
LYMPHS ABS: 2.8 10*3/uL (ref 0.7–4.0)
LYMPHS PCT: 42 %
MCH: 27.6 pg (ref 26.0–34.0)
MCHC: 31.1 g/dL (ref 30.0–36.0)
MCV: 88.8 fL (ref 78.0–100.0)
MONO ABS: 0.5 10*3/uL (ref 0.1–1.0)
MONOS PCT: 8 %
NEUTROS ABS: 3 10*3/uL (ref 1.7–7.7)
Neutrophils Relative %: 45 %
Platelets: 190 10*3/uL (ref 150–400)
RBC: 4.63 MIL/uL (ref 4.22–5.81)
RDW: 13.6 % (ref 11.5–15.5)
WBC: 6.6 10*3/uL (ref 4.0–10.5)

## 2015-08-04 LAB — COMPREHENSIVE METABOLIC PANEL
ALT: 12 U/L — AB (ref 17–63)
AST: 16 U/L (ref 15–41)
Albumin: 3.3 g/dL — ABNORMAL LOW (ref 3.5–5.0)
Alkaline Phosphatase: 39 U/L (ref 38–126)
Anion gap: 11 (ref 5–15)
BILIRUBIN TOTAL: 0.9 mg/dL (ref 0.3–1.2)
BUN: 14 mg/dL (ref 6–20)
CO2: 25 mmol/L (ref 22–32)
CREATININE: 0.83 mg/dL (ref 0.61–1.24)
Calcium: 8.9 mg/dL (ref 8.9–10.3)
Chloride: 105 mmol/L (ref 101–111)
Glucose, Bld: 101 mg/dL — ABNORMAL HIGH (ref 65–99)
POTASSIUM: 3.7 mmol/L (ref 3.5–5.1)
Sodium: 141 mmol/L (ref 135–145)
TOTAL PROTEIN: 6.4 g/dL — AB (ref 6.5–8.1)

## 2015-08-04 MED ORDER — HYDRALAZINE HCL 25 MG PO TABS
25.0000 mg | ORAL_TABLET | Freq: Once | ORAL | Status: AC
  Administered 2015-08-04: 25 mg via ORAL
  Filled 2015-08-04: qty 1

## 2015-08-04 MED ORDER — VALSARTAN-HYDROCHLOROTHIAZIDE 320-12.5 MG PO TABS: 1.0000 | ORAL_TABLET | Freq: Every day | ORAL | Status: DC

## 2015-08-04 NOTE — ED Notes (Signed)
Pt here to get Valsarten refilled-- was a pt of Dr. Bruna PotterBlount-- and Dr. Bruna PotterBlount passed away a few months ago. Pt states he is unable to get into another dr yet.

## 2015-08-04 NOTE — ED Provider Notes (Signed)
CSN: [id number]     Arrival date & time 08/04/15  1205 History   First MD Initiated Contact with Patient 08/04/15 1625     Chief Complaint  Patient presents with  . Medication Refill  . Hypertension     (Consider location/radiation/quality/duration/timing/severity/associated sxs/prior Treatment) HPI   82 year old male with history of hypertension that is poorly controlled presenting requesting for medication refill for HTN.  Pt report his PCP passed away last year and since then he does not have a PCP to get his BP so he would come to the ER requesting for meds.  He is nearly out of his BP med, Valsartan-HCTZ 320-12.5mg  and requesting for a refill.  Pt currently denies having headache, diplopia, facial droops, difficulty speaking, SOB, orthopnea, PND, edema, oligouria, CP, lightheadedness, falls, dizziness. He reportedly have been complaint with his BP medication.  He also have been reaching out to [facility] to set up a new pt visit but without luck. He admits that he ate bacon and grits this AM and think that's the reason his BP is elevated currently.       Past Medical History  Diagnosis Date  . Enlarged prostate   . Hypertension   . Cold (disease)     last week ( week of Dec 22- Dec 29 ) - no longer coughing, no fever and feels better now  . BPH (benign prostatic hypertrophy)     problems with acute urinary retention- has indwelling foley catheter  . Arthritis     fingers and hands   Past Surgical History  Procedure Laterality Date  .  right elbow surgery as a child    . Prostatectomy  04/17/2012    Procedure: PROSTATECTOMY RETROPUBIC;  Surgeon: Lindaann Slough, MD;  Location: [facility];  Service: Urology;  Laterality: N/A;  Simple Retropubic Prostatectomy     No family history on file. Social History  Substance Use Topics  . Smoking status: Never Smoker   . Smokeless tobacco: Never Used  . Alcohol Use: Yes     Comment: socially    Review of Systems  All other  systems reviewed and are negative.     Allergies  Review of patient's allergies indicates no known allergies.  Home Medications   Prior to Admission medications   Medication Sig Start Date End Date Taking? Authorizing Provider  valsartan-hydrochlorothiazide (DIOVAN HCT) 320-12.5 MG tablet Take 1 tablet by mouth daily. 06/07/15  Yes [name], PA-C  amLODipine (NORVASC) 10 MG tablet Take 10 mg by mouth daily. Reported on 08/04/2015    Historical Provider, MD  ciprofloxacin (CIPRO) 250 MG tablet Take 1 tablet (250 mg total) by mouth 2 (two) times daily. 04/20/12   Su Grand, MD  furosemide (LASIX) 20 MG tablet Take 20 mg by mouth every morning. Reported on 08/04/2015    Historical Provider, MD  hydrALAZINE (APRESOLINE) 25 MG tablet Take 1 tablet (25 mg total) by mouth 2 (two) times daily. Patient not taking: Reported on 08/04/2015 06/07/15   [name], PA-C  ibuprofen (ADVIL,MOTRIN) 800 MG tablet Take 1 tablet (800 mg total) by mouth 3 (three) times daily. 04/23/14   Eber Hong, MD  metoprolol succinate (TOPROL-XL) 50 MG 24 hr tablet Take 50 mg by mouth every morning. Reported on 08/04/2015    Historical Provider, MD  polyethylene glycol powder (GLYCOLAX/MIRALAX) powder Take 17 g by mouth daily. 06/07/15   [name], PA-C  rosuvastatin (CRESTOR) 20 MG tablet Take 20 mg by mouth daily. Reported on  08/04/2015    Historical Provider, MD  Tamsulosin HCl (FLOMAX) 0.4 MG CAPS Take 0.4 mg by mouth daily. Reported on 08/04/2015    Historical Provider, MD   BP 250/111 mmHg  Pulse 59  Temp(Src) 98.3 F (36.8 C) (Oral)  Resp 16  Ht 5\' 5"  (1.651 m)  Wt 80.423 kg  BMI 29.50 kg/m2  SpO2 100% Physical Exam  Constitutional: He is oriented to person, place, and time. He appears well-developed and well-nourished. No distress.  HENT:  Head: Atraumatic.  Eyes: Conjunctivae and EOM are normal. Pupils are equal, round, and reactive to light.  Neck: Neck supple. No JVD present.  Cardiovascular: Normal rate  and regular rhythm.   Pulmonary/Chest: Effort normal and breath sounds normal.  Abdominal: Soft. There is no tenderness.  Neurological: He is alert and oriented to person, place, and time. He has normal strength. No cranial nerve deficit or sensory deficit. He displays a negative Romberg sign. Coordination and gait normal. GCS eye subscore is 4. GCS verbal subscore is 5. GCS motor subscore is 6.  Skin: No rash noted.  Psychiatric: He has a normal mood and affect.  Nursing note and vitals reviewed.   ED Course  Procedures (including critical care time) Labs Review Labs Reviewed  CBC WITH DIFFERENTIAL/PLATELET - Abnormal; Notable for the following:    Hemoglobin 12.8 (*)    All other components within normal limits  COMPREHENSIVE METABOLIC PANEL - Abnormal; Notable for the following:    Glucose, Bld 101 (*)    Total Protein 6.4 (*)    Albumin 3.3 (*)    ALT 12 (*)    All other components within normal limits    Imaging Review No results found. I have personally reviewed and evaluated these images and lab results as part of my medical decision-making.   EKG Interpretation None     ED ECG REPORT   Date: 08/04/2015  Rate: 57  Rhythm: normal sinus rhythm  QRS Axis: normal  Intervals: PR prolonged  ST/T Wave abnormalities: nonspecific ST/T changes  Conduction Disutrbances:none  Narrative Interpretation:   Old EKG Reviewed: unchanged  I have personally reviewed the EKG tracing and agree with the computerized printout as noted.   MDM   Final diagnoses:  Asymptomatic hypertensive urgency  Encounter for medication refill   BP 197/82 mmHg  Pulse 51  Temp(Src) 98.3 F (36.8 C) (Oral)  Resp 15  Ht 5\' 5"  (1.651 m)  Wt 80.423 kg  BMI 29.50 kg/m2  SpO2 98%   BP 197/82 mmHg  Pulse 51  Temp(Src) 98.3 F (36.8 C) (Oral)  Resp 15  Ht 5\' 5"  (1.651 m)  Wt 80.423 kg  BMI 29.50 kg/m2  SpO2 98%   5:38 PM Patient presents requesting for refill of his blood pressure  medication. He is currently taking valsartan/HCTZ, and close to running out of his medication. He does not have a PCP currently. His initial blood pressure was 250/111 remain asymptomatic. Blood pressure on recheck is 207/75 without specific treatment. Provide blood pressure medication to help slowly bring down his hypertension  8:23 PM EKG without acute changes. Blood pressure is now 197/82. Patient remains symptomatic. Labs are reassuring and no significant changes to suggest hypertensive emergency. Care discussed with Dr. Deeann CreeYelton. I've also consult with our case manager who has seen and evaluate patient and have set patient up for a outpatient follow with [facility] for further management of his health.  Medication refilled.    Fayrene HelperBowie Tallyn Holroyd,  PA-C 08/04/15 2032  Loren Racer, MD 08/05/15 2358

## 2015-08-04 NOTE — ED Notes (Signed)
Pt states he is only here because is valsartan is almost finished and he was told to come here for a refill, per pt.  Denies any other s/s.

## 2015-08-04 NOTE — Discharge Instructions (Signed)
[facility] with [facility] for further management of your health.   Medicine Refill at the Emergency Department We have refilled your medicine today, but it is best for you to get refills through your primary health care provider's office. In the future, please plan ahead so you do not need to get refills from the emergency department. If the medicine we refilled was a maintenance medicine, you may have received only enough to get you by until you are able to see your regular health care provider.   This information is not intended to replace advice given to you by your health care provider. Make sure you discuss any questions you have with your health care provider.   Document Released: 07/19/2003 Document Revised: 04/22/2014 Document Reviewed: 07/09/2013 Elsevier Interactive Patient Education 2016 Elsevier Inc. DASH Eating Plan DASH stands for "Dietary Approaches to Stop Hypertension." The DASH eating plan is a healthy eating plan that has been shown to reduce high blood pressure (hypertension). Additional health benefits may include reducing the risk of type 2 diabetes mellitus, heart disease, and stroke. The DASH eating plan may also help with weight loss. WHAT DO I NEED TO KNOW ABOUT THE DASH EATING PLAN? For the DASH eating plan, you will follow these general guidelines:  Choose foods with a percent daily value for sodium of less than 5% (as listed on the food label).  Use salt-free seasonings or herbs instead of table salt or sea salt.  Check with your health care provider or pharmacist before using salt substitutes.  Eat lower-sodium products, often labeled as "lower sodium" or "no salt added."  Eat fresh foods.  Eat more vegetables, fruits, and low-fat dairy products.  Choose whole grains. Look for the word "whole" as the first word in the ingredient list.  Choose fish and skinless chicken or [place] more often than red meat. Limit fish, poultry, and meat to  6 oz (170 g) each day.  Limit sweets, desserts, sugars, and sugary drinks.  Choose heart-healthy fats.  Limit cheese to 1 oz (28 g) per day.  Eat more home-cooked food and less restaurant, buffet, and fast food.  Limit fried foods.  Cook foods using methods other than frying.  Limit canned vegetables. If you do use them, rinse them well to decrease the sodium.  When eating at a restaurant, ask that your food be prepared with less salt, or no salt if possible. WHAT FOODS CAN I EAT? Seek help from a dietitian for individual calorie needs. Grains Whole grain or whole wheat bread. Brown rice. Whole grain or whole wheat pasta. Quinoa, bulgur, and whole grain cereals. Low-sodium cereals. Corn or whole wheat flour tortillas. Whole grain cornbread. Whole grain crackers. Low-sodium crackers. Vegetables Fresh or frozen vegetables (raw, steamed, roasted, or grilled). Low-sodium or reduced-sodium tomato and vegetable juices. Low-sodium or reduced-sodium tomato sauce and paste. Low-sodium or reduced-sodium canned vegetables.  Fruits All fresh, canned (in natural juice), or frozen fruits. Meat and Other Protein Products Ground beef (85% or leaner), grass-fed beef, or beef trimmed of fat. Skinless chicken or [place]. Ground chicken or [place]. Pork trimmed of fat. All fish and seafood. Eggs. Dried beans, peas, or lentils. Unsalted nuts and seeds. Unsalted canned beans. Dairy Low-fat dairy products, such as skim or 1% milk, 2% or reduced-fat cheeses, low-fat ricotta or cottage cheese, or plain low-fat yogurt. Low-sodium or reduced-sodium cheeses. Fats and Oils Tub margarines without trans fats. Light or reduced-fat mayonnaise and salad dressings (reduced sodium). Avocado. Safflower,  olive, or canola oils. Natural peanut or almond butter. Other Unsalted popcorn and pretzels. The items listed above may not be a complete list of recommended foods or beverages. Contact your dietitian for more  options. WHAT FOODS ARE NOT RECOMMENDED? Grains White bread. White pasta. White rice. Refined cornbread. Bagels and croissants. Crackers that contain trans fat. Vegetables Creamed or fried vegetables. Vegetables in a cheese sauce. Regular canned vegetables. Regular canned tomato sauce and paste. Regular tomato and vegetable juices. Fruits Dried fruits. Canned fruit in light or heavy syrup. Fruit juice. Meat and Other Protein Products Fatty cuts of meat. Ribs, chicken wings, bacon, sausage, bologna, salami, chitterlings, fatback, hot dogs, bratwurst, and packaged luncheon meats. Salted nuts and seeds. Canned beans with salt. Dairy Whole or 2% milk, cream, half-and-half, and cream cheese. Whole-fat or sweetened yogurt. Full-fat cheeses or blue cheese. Nondairy creamers and whipped toppings. Processed cheese, cheese spreads, or cheese curds. Condiments Onion and garlic salt, seasoned salt, table salt, and sea salt. Canned and packaged gravies. Worcestershire sauce. Tartar sauce. Barbecue sauce. Teriyaki sauce. Soy sauce, including reduced sodium. Steak sauce. Fish sauce. Oyster sauce. Cocktail sauce. Horseradish. Ketchup and mustard. Meat flavorings and tenderizers. Bouillon cubes. Hot sauce. Tabasco sauce. Marinades. Taco seasonings. Relishes. Fats and Oils Butter, stick margarine, lard, shortening, ghee, and bacon fat. Coconut, palm kernel, or palm oils. Regular salad dressings. Other Pickles and olives. Salted popcorn and pretzels. The items listed above may not be a complete list of foods and beverages to avoid. Contact your dietitian for more information. WHERE CAN I FIND MORE INFORMATION? National Heart, Lung, and Blood Institute: [employer]   This information is not intended to replace advice given to you by your health care provider. Make sure you discuss any questions you have with your health care provider.   Document Released: 03/21/2011  Document Revised: 04/22/2014 Document Reviewed: 02/03/2013 Elsevier Interactive Patient Education [employer]

## 2015-08-07 ENCOUNTER — Telehealth: Payer: Self-pay

## 2015-08-07 NOTE — Telephone Encounter (Signed)
Message received from Michel BickersWandalyn Rogers, RN CM requesting a hospital follow up [facility]. Call placed to # [phone] (H) and a HIPAA compliant voice mail message was left requesting a call back to # [phone] or [phone]. A call was also placed to #  [phone] (W) and the phone rang a couple of times and then ended. Marland Kitchen.   Update provided to Beecher McardleW. Rogers, RN CM

## 2015-08-08 ENCOUNTER — Telehealth: Payer: Self-pay

## 2015-08-08 NOTE — Telephone Encounter (Signed)
Attempted to contact the patient again to discuss scheduling a hospital follow up appointment.  Call placed to # [phone] (H) and a HIPAA compliant voice mail message was left requesting a call back to # [phone] or [phone]. A call was also placed to #  [phone] (W) and the message stated " you have reached the event prep office"  - no message was left at that number.

## 2015-08-14 ENCOUNTER — Telehealth: Payer: Self-pay

## 2015-08-14 NOTE — Telephone Encounter (Signed)
Attempted to contact the patient to discuss scheduling a [facility]. Call placed to # [phone] (H) and a HIPAA compliant voice mail message was left requesting a call back to # [phone] or [phone].

## 2015-08-25 ENCOUNTER — Ambulatory Visit: Payer: Commercial Managed Care - HMO | Attending: Family Medicine | Admitting: Family Medicine

## 2015-08-25 ENCOUNTER — Encounter: Payer: Self-pay | Admitting: Family Medicine

## 2015-08-25 VITALS — BP 171/66 | HR 59 | Temp 98.1°F | Resp 12 | Ht 65.0 in | Wt 176.8 lb

## 2015-08-25 DIAGNOSIS — I10 Essential (primary) hypertension: Secondary | ICD-10-CM | POA: Diagnosis not present

## 2015-08-25 NOTE — Progress Notes (Signed)
Subjective:  Patient ID: Jesus Morrison, male    DOB: [birth year]  Age: 82 y.o. MRN: [id number]  CC: Follow-up and Hypertension   HPI Jesus Morrison presents to establish care after an ED visit for anti hypertensive medication refills in which his systolic blood pressure was in the 200s. He reports taking his antihypertensives today and his blood pressure is elevated; he is unable to name his medications, neither does he have them with him. Denies chest pains or shortness of breath.   Outpatient Prescriptions Prior to Visit  Medication Sig Dispense Refill  . amLODipine (NORVASC) 10 MG tablet Take 10 mg by mouth daily. Reported on 08/04/2015    . ciprofloxacin (CIPRO) 250 MG tablet Take 1 tablet (250 mg total) by mouth 2 (two) times daily. 20 tablet 0  . furosemide (LASIX) 20 MG tablet Take 20 mg by mouth every morning. Reported on 08/04/2015    . ibuprofen (ADVIL,MOTRIN) 800 MG tablet Take 1 tablet (800 mg total) by mouth 3 (three) times daily. 21 tablet 0  . metoprolol succinate (TOPROL-XL) 50 MG 24 hr tablet Take 50 mg by mouth every morning. Reported on 08/04/2015    . polyethylene glycol powder (GLYCOLAX/MIRALAX) powder Take 17 g by mouth daily. 255 g 0  . rosuvastatin (CRESTOR) 20 MG tablet Take 20 mg by mouth daily. Reported on 08/04/2015    . Tamsulosin HCl (FLOMAX) 0.4 MG CAPS Take 0.4 mg by mouth daily. Reported on 08/04/2015    . valsartan-hydrochlorothiazide (DIOVAN HCT) 320-12.5 MG tablet Take 1 tablet by mouth daily. 30 tablet 1  . hydrALAZINE (APRESOLINE) 25 MG tablet Take 1 tablet (25 mg total) by mouth 2 (two) times daily. (Patient not taking: Reported on 08/04/2015) 60 tablet 1   No facility-administered medications prior to visit.    ROS Review of Systems  Constitutional: Negative for activity change and appetite change.  HENT: Negative for sinus pressure and sore throat.   Respiratory: Negative for chest tightness, shortness of breath and wheezing.   Cardiovascular: Negative  for chest pain and palpitations.  Gastrointestinal: Negative for abdominal pain, constipation and abdominal distention.  Genitourinary: Negative.   Musculoskeletal: Negative.   Psychiatric/Behavioral: Negative for behavioral problems and dysphoric mood.    Objective:  BP 171/66 mmHg  Pulse 59  Temp(Src) 98.1 F (36.7 C) (Oral)  Resp 12  Ht  (1.651 m)  Wt 176 lb 12.8 oz (80.196 kg)  BMI 29.42 kg/m2  SpO2 99%  BP/Weight 08/25/2015 08/04/2015 07/12/2015  Systolic BP 171 192 147  Diastolic BP 66 68 86  Wt. (Lbs) 176.8 177.3 -  BMI 29.42 29.5 -      Physical Exam  Constitutional: He is oriented to person, place, and time. He appears well-developed and well-nourished.  Cardiovascular: Normal rate and intact distal pulses.   Murmur (2/6 systolic ejection murmur) heard. Pulmonary/Chest: Effort normal and breath sounds normal. He has no wheezes. He has no rales. He exhibits no tenderness.  Abdominal: Soft. Bowel sounds are normal. He exhibits no distension and no mass. There is no tenderness.  Musculoskeletal: Normal range of motion.  Neurological: He is alert and oriented to person, place, and time.     Assessment & Plan:   1. Essential hypertension Uncontrolled He does not have his medications with him and only knows that he takes 2 antihypertensives even though his chart reveals he is on 4 antihypertensives. Advised to bring all his medications to the clinic next week for proper medication reconsideration and adjustment of  dose as needed. Low-sodium, DASH diet   No orders of the defined types were placed in this encounter.    Follow-up: Return in about 1 week (around 09/01/2015) for Follow-up on hypertension.   Jaclyn ShaggyEnobong Amao MD

## 2015-08-25 NOTE — Progress Notes (Signed)
Patient's here for ED f/up HTN.   Patient would like to establish care with PCP.  Patient denies any pain today.  Patient requesting medication refills.

## 2015-08-25 NOTE — Patient Instructions (Signed)
Hypertension Hypertension, commonly called high blood pressure, is when the force of blood pumping through your arteries is too strong. Your arteries are the blood vessels that carry blood from your heart throughout your body. A blood pressure reading consists of a higher number over a lower number, such as 110/72. The higher number (systolic) is the pressure inside your arteries when your heart pumps. The lower number (diastolic) is the pressure inside your arteries when your heart relaxes. Ideally you want your blood pressure below 120/80. Hypertension forces your heart to work harder to pump blood. Your arteries may become narrow or stiff. Having untreated or uncontrolled hypertension can cause heart attack, stroke, kidney disease, and other problems. RISK FACTORS Some risk factors for high blood pressure are controllable. Others are not.  Risk factors you cannot control include:   Race. You may be at higher risk if you are African American.  Age. Risk increases with age.  Gender. Men are at higher risk than women before age 45 years. After age 65, women are at higher risk than men. Risk factors you can control include:  Not getting enough exercise or physical activity.  Being overweight.  Getting too much fat, sugar, calories, or salt in your diet.  Drinking too much alcohol. SIGNS AND SYMPTOMS Hypertension does not usually cause signs or symptoms. Extremely high blood pressure (hypertensive crisis) may cause headache, anxiety, shortness of breath, and nosebleed. DIAGNOSIS To check if you have hypertension, your health care provider will measure your blood pressure while you are seated, with your arm held at the level of your heart. It should be measured at least twice using the same arm. Certain conditions can cause a difference in blood pressure between your right and left arms. A blood pressure reading that is higher than normal on one occasion does not mean that you need treatment. If  it is not clear whether you have high blood pressure, you may be asked to return on a different day to have your blood pressure checked again. Or, you may be asked to monitor your blood pressure at home for 1 or more weeks. TREATMENT Treating high blood pressure includes making lifestyle changes and possibly taking medicine. Living a healthy lifestyle can help lower high blood pressure. You may need to change some of your habits. Lifestyle changes may include:  Following the DASH diet. This diet is high in fruits, vegetables, and whole grains. It is low in salt, red meat, and added sugars.  Keep your sodium intake below 2,300 mg per day.  Getting at least 30-45 minutes of aerobic exercise at least 4 times per week.  Losing weight if necessary.  Not smoking.  Limiting alcoholic beverages.  Learning ways to reduce stress. Your health care provider may prescribe medicine if lifestyle changes are not enough to get your blood pressure under control, and if one of the following is true:  You are 18-59 years of age and your systolic blood pressure is above 140.  You are 60 years of age or older, and your systolic blood pressure is above 150.  Your diastolic blood pressure is above 90.  You have diabetes, and your systolic blood pressure is over 140 or your diastolic blood pressure is over 90.  You have kidney disease and your blood pressure is above 140/90.  You have heart disease and your blood pressure is above 140/90. Your personal target blood pressure may vary depending on your medical conditions, your age, and other factors. HOME CARE INSTRUCTIONS    Have your blood pressure rechecked as directed by your health care provider.   Take medicines only as directed by your health care provider. Follow the directions carefully. Blood pressure medicines must be taken as prescribed. The medicine does not work as well when you skip doses. Skipping doses also puts you at risk for  problems.  Do not smoke.   Monitor your blood pressure at home as directed by your health care provider. SEEK MEDICAL CARE IF:   You think you are having a reaction to medicines taken.  You have recurrent headaches or feel dizzy.  You have swelling in your ankles.  You have trouble with your vision. SEEK IMMEDIATE MEDICAL CARE IF:  You develop a severe headache or confusion.  You have unusual weakness, numbness, or feel faint.  You have severe chest or abdominal pain.  You vomit repeatedly.  You have trouble breathing. MAKE SURE YOU:   Understand these instructions.  Will watch your condition.  Will get help right away if you are not doing well or get worse.   This information is not intended to replace advice given to you by your health care provider. Make sure you discuss any questions you have with your health care provider.   Document Released: 04/01/2005 Document Revised: 08/16/2014 Document Reviewed: 01/22/2013 Elsevier Interactive Patient Education 2016 Elsevier Inc.  

## 2015-09-01 ENCOUNTER — Ambulatory Visit: Payer: Commercial Managed Care - HMO | Attending: Family Medicine | Admitting: Family Medicine

## 2015-09-01 ENCOUNTER — Encounter: Payer: Self-pay | Admitting: Family Medicine

## 2015-09-01 VITALS — BP 160/68 | HR 60 | Temp 98.1°F | Resp 16 | Ht 65.0 in | Wt 177.4 lb

## 2015-09-01 DIAGNOSIS — N400 Enlarged prostate without lower urinary tract symptoms: Secondary | ICD-10-CM | POA: Insufficient documentation

## 2015-09-01 DIAGNOSIS — I10 Essential (primary) hypertension: Secondary | ICD-10-CM

## 2015-09-01 DIAGNOSIS — Z79899 Other long term (current) drug therapy: Secondary | ICD-10-CM | POA: Insufficient documentation

## 2015-09-01 MED ORDER — VALSARTAN-HYDROCHLOROTHIAZIDE 320-12.5 MG PO TABS: 1.0000 | ORAL_TABLET | Freq: Every day | ORAL | Status: DC

## 2015-09-01 MED ORDER — HYDRALAZINE HCL 50 MG PO TABS: 50.0000 mg | ORAL_TABLET | Freq: Two times a day (BID) | ORAL | Status: DC

## 2015-09-01 MED ORDER — HYDRALAZINE HCL 25 MG PO TABS: 50.0000 mg | ORAL_TABLET | Freq: Two times a day (BID) | ORAL | Status: DC

## 2015-09-01 MED ORDER — AMLODIPINE BESYLATE 10 MG PO TABS: 10.0000 mg | ORAL_TABLET | Freq: Every day | ORAL | Status: DC

## 2015-09-01 NOTE — Patient Instructions (Signed)
Hypertension Hypertension, commonly called high blood pressure, is when the force of blood pumping through your arteries is too strong. Your arteries are the blood vessels that carry blood from your heart throughout your body. A blood pressure reading consists of a higher number over a lower number, such as 110/72. The higher number (systolic) is the pressure inside your arteries when your heart pumps. The lower number (diastolic) is the pressure inside your arteries when your heart relaxes. Ideally you want your blood pressure below 120/80. Hypertension forces your heart to work harder to pump blood. Your arteries may become narrow or stiff. Having untreated or uncontrolled hypertension can cause heart attack, stroke, kidney disease, and other problems. RISK FACTORS Some risk factors for high blood pressure are controllable. Others are not.  Risk factors you cannot control include:   Race. You may be at higher risk if you are African American.  Age. Risk increases with age.  Gender. Men are at higher risk than women before age 45 years. After age 65, women are at higher risk than men. Risk factors you can control include:  Not getting enough exercise or physical activity.  Being overweight.  Getting too much fat, sugar, calories, or salt in your diet.  Drinking too much alcohol. SIGNS AND SYMPTOMS Hypertension does not usually cause signs or symptoms. Extremely high blood pressure (hypertensive crisis) may cause headache, anxiety, shortness of breath, and nosebleed. DIAGNOSIS To check if you have hypertension, your health care provider will measure your blood pressure while you are seated, with your arm held at the level of your heart. It should be measured at least twice using the same arm. Certain conditions can cause a difference in blood pressure between your right and left arms. A blood pressure reading that is higher than normal on one occasion does not mean that you need treatment. If  it is not clear whether you have high blood pressure, you may be asked to return on a different day to have your blood pressure checked again. Or, you may be asked to monitor your blood pressure at home for 1 or more weeks. TREATMENT Treating high blood pressure includes making lifestyle changes and possibly taking medicine. Living a healthy lifestyle can help lower high blood pressure. You may need to change some of your habits. Lifestyle changes may include:  Following the DASH diet. This diet is high in fruits, vegetables, and whole grains. It is low in salt, red meat, and added sugars.  Keep your sodium intake below 2,300 mg per day.  Getting at least 30-45 minutes of aerobic exercise at least 4 times per week.  Losing weight if necessary.  Not smoking.  Limiting alcoholic beverages.  Learning ways to reduce stress. Your health care provider may prescribe medicine if lifestyle changes are not enough to get your blood pressure under control, and if one of the following is true:  You are 18-59 years of age and your systolic blood pressure is above 140.  You are 60 years of age or older, and your systolic blood pressure is above 150.  Your diastolic blood pressure is above 90.  You have diabetes, and your systolic blood pressure is over 140 or your diastolic blood pressure is over 90.  You have kidney disease and your blood pressure is above 140/90.  You have heart disease and your blood pressure is above 140/90. Your personal target blood pressure may vary depending on your medical conditions, your age, and other factors. HOME CARE INSTRUCTIONS    Have your blood pressure rechecked as directed by your health care provider.   Take medicines only as directed by your health care provider. Follow the directions carefully. Blood pressure medicines must be taken as prescribed. The medicine does not work as well when you skip doses. Skipping doses also puts you at risk for  problems.  Do not smoke.   Monitor your blood pressure at home as directed by your health care provider. SEEK MEDICAL CARE IF:   You think you are having a reaction to medicines taken.  You have recurrent headaches or feel dizzy.  You have swelling in your ankles.  You have trouble with your vision. SEEK IMMEDIATE MEDICAL CARE IF:  You develop a severe headache or confusion.  You have unusual weakness, numbness, or feel faint.  You have severe chest or abdominal pain.  You vomit repeatedly.  You have trouble breathing. MAKE SURE YOU:   Understand these instructions.  Will watch your condition.  Will get help right away if you are not doing well or get worse.   This information is not intended to replace advice given to you by your health care provider. Make sure you discuss any questions you have with your health care provider.   Document Released: 04/01/2005 Document Revised: 08/16/2014 Document Reviewed: 01/22/2013 Elsevier Interactive Patient Education 2016 Elsevier Inc.  

## 2015-09-01 NOTE — Progress Notes (Signed)
Patient's here for f/up HTN.  Patient denies any pain today.  Patient requesting refills on Hydralzine,Valsartan-HCTZ.

## 2015-09-01 NOTE — Progress Notes (Signed)
Subjective:  Patient ID: Jesus Morrison, male    DOB: [birth year]  Age: 82 y.o. MRN: [id number]  CC: Follow-up; Hypertension; and Medication Refill   HPI Jesus Morrison 82 year old male with a history of hypertension who comes in today for follow-up of his hypertension. His blood pressure was significantly indicated at his last office visit and as well today despite compliance with his medications and he was advised to come in for this office visit with his medications which I have reviewed. He has hydralazine and losartan/HCTZ but amlodipine which appears on his med list is missing. He has not been compliant with a low-sodium diet either.  Past Medical History  Diagnosis Date  . Enlarged prostate   . Hypertension   . Cold (disease)     last week ( week of Dec 22- Dec 29 ) - no longer coughing, no fever and feels better now  . BPH (benign prostatic hypertrophy)     problems with acute urinary retention- has indwelling foley catheter  . Arthritis     fingers and hands      Outpatient Prescriptions Prior to Visit  Medication Sig Dispense Refill  . hydrALAZINE (APRESOLINE) 25 MG tablet Take 1 tablet (25 mg total) by mouth 2 (two) times daily. 60 tablet 1  . valsartan-hydrochlorothiazide (DIOVAN HCT) 320-12.5 MG tablet Take 1 tablet by mouth daily. 30 tablet 1  . ciprofloxacin (CIPRO) 250 MG tablet Take 1 tablet (250 mg total) by mouth 2 (two) times daily. (Patient not taking: Reported on 09/01/2015) 20 tablet 0  . furosemide (LASIX) 20 MG tablet Take 20 mg by mouth every morning. Reported on 09/01/2015    . ibuprofen (ADVIL,MOTRIN) 800 MG tablet Take 1 tablet (800 mg total) by mouth 3 (three) times daily. (Patient not taking: Reported on 09/01/2015) 21 tablet 0  . polyethylene glycol powder (GLYCOLAX/MIRALAX) powder Take 17 g by mouth daily. (Patient not taking: Reported on 09/01/2015) 255 g 0  . rosuvastatin (CRESTOR) 20 MG tablet Take 20 mg by mouth daily. Reported on 09/01/2015    .  Tamsulosin HCl (FLOMAX) 0.4 MG CAPS Take 0.4 mg by mouth daily. Reported on 09/01/2015    . amLODipine (NORVASC) 10 MG tablet Take 10 mg by mouth daily. Reported on 09/01/2015    . metoprolol succinate (TOPROL-XL) 50 MG 24 hr tablet Take 50 mg by mouth every morning. Reported on 09/01/2015     No facility-administered medications prior to visit.    ROS Review of Systems  Constitutional: Negative for activity change and appetite change.  HENT: Negative for sinus pressure and sore throat.   Eyes: Negative for visual disturbance.  Respiratory: Negative for cough, chest tightness and shortness of breath.   Cardiovascular: Negative for chest pain and leg swelling.  Gastrointestinal: Negative for abdominal pain, diarrhea, constipation and abdominal distention.  Endocrine: Negative.   Genitourinary: Negative for dysuria.  Musculoskeletal: Negative for myalgias and joint swelling.  Skin: Negative for rash.  Allergic/Immunologic: Negative.   Neurological: Negative for weakness, light-headedness and numbness.  Psychiatric/Behavioral: Negative for suicidal ideas and dysphoric mood.    Objective:  BP 188/71 mmHg  Pulse 60  Temp(Src) 98.1 F (36.7 C) (Oral)  Resp 16  Ht  (1.651 m)  Wt 177 lb 6.4 oz (80.468 kg)  BMI 29.52 kg/m2  SpO2 100%  BP/Weight 09/01/2015 08/25/2015 08/04/2015  Systolic BP 188 171 192  Diastolic BP 71 66 68  Wt. (Lbs) 177.4 176.8 177.3  BMI 29.52 29.42 29.5  Physical Exam  Constitutional: He is oriented to person, place, and time. He appears well-developed and well-nourished.  Cardiovascular: Normal rate, normal heart sounds and intact distal pulses.   No murmur heard. Pulmonary/Chest: Effort normal and breath sounds normal. He has no wheezes. He has no rales. He exhibits no tenderness.  Abdominal: Soft. Bowel sounds are normal. He exhibits no distension and no mass. There is no tenderness.  Musculoskeletal: Normal range of motion.  Neurological: He is  alert and oriented to person, place, and time.    CMP Latest Ref Rng 08/04/2015 04/20/2012 04/18/2012  Glucose 65 - 99 mg/dL 875(I101(H) 433(I122(H) 951(O172(H)  BUN 6 - 20 mg/dL 14 9 10   Creatinine 0.61 - 1.24 mg/dL 8.410.83 6.600.81 6.300.89  Sodium 135 - 145 mmol/L 141 135 137  Potassium 3.5 - 5.1 mmol/L 3.7 4.1 4.4  Chloride 101 - 111 mmol/L 105 101 102  CO2 22 - 32 mmol/L 25 31 27   Calcium 8.9 - 10.3 mg/dL 8.9 1.6(W8.3(L) 8.4  Total Protein 6.5 - 8.1 g/dL 6.4(L) - -  Total Bilirubin 0.3 - 1.2 mg/dL 0.9 - -  Alkaline Phos 38 - 126 U/L 39 - -  AST 15 - 41 U/L 16 - -  ALT 17 - 63 U/L 12(L) - -     Assessment & Plan:   1. Essential hypertension Uncontrolled I have added amlodipine to his regimen which appears on his list but he has not been taking Increased dose of hydralazine. Low-sodium, DASH diet Review blood pressure at next office visit - amLODipine (NORVASC) 10 MG tablet; Take 1 tablet (10 mg total) by mouth daily.  Dispense: 30 tablet; Refill: 3 - valsartan-hydrochlorothiazide (DIOVAN HCT) 320-12.5 MG tablet; Take 1 tablet by mouth daily.  Dispense: 30 tablet; Refill: 3 - hydrALAZINE (APRESOLINE) 25 MG tablet; Take 2 tablets (50 mg total) by mouth 2 (two) times daily.  Dispense: 60 tablet; Refill: 3   Meds ordered this encounter  Medications  . amLODipine (NORVASC) 10 MG tablet    Sig: Take 1 tablet (10 mg total) by mouth daily.    Dispense:  30 tablet    Refill:  3  . valsartan-hydrochlorothiazide (DIOVAN HCT) 320-12.5 MG tablet    Sig: Take 1 tablet by mouth daily.    Dispense:  30 tablet    Refill:  3  . hydrALAZINE (APRESOLINE) 25 MG tablet    Sig: Take 2 tablets (50 mg total) by mouth 2 (two) times daily.    Dispense:  60 tablet    Refill:  3    Discontinue previous dose    Follow-up: Return in about 2 weeks (around 09/15/2015) for Follow-up on hypertension.   Jaclyn ShaggyEnobong Amao MD

## 2015-09-15 ENCOUNTER — Encounter: Payer: Self-pay | Admitting: Family Medicine

## 2015-09-15 ENCOUNTER — Ambulatory Visit: Payer: Commercial Managed Care - HMO | Attending: Family Medicine | Admitting: Family Medicine

## 2015-09-15 VITALS — BP 160/69 | HR 62 | Temp 98.4°F | Ht 65.0 in | Wt 173.0 lb

## 2015-09-15 DIAGNOSIS — I10 Essential (primary) hypertension: Secondary | ICD-10-CM

## 2015-09-15 NOTE — Patient Instructions (Signed)
Hypertension Hypertension, commonly called high blood pressure, is when the force of blood pumping through your arteries is too strong. Your arteries are the blood vessels that carry blood from your heart throughout your body. A blood pressure reading consists of a higher number over a lower number, such as 110/72. The higher number (systolic) is the pressure inside your arteries when your heart pumps. The lower number (diastolic) is the pressure inside your arteries when your heart relaxes. Ideally you want your blood pressure below 120/80. Hypertension forces your heart to work harder to pump blood. Your arteries may become narrow or stiff. Having untreated or uncontrolled hypertension can cause heart attack, stroke, kidney disease, and other problems. RISK FACTORS Some risk factors for high blood pressure are controllable. Others are not.  Risk factors you cannot control include:   Race. You may be at higher risk if you are African American.  Age. Risk increases with age.  Gender. Men are at higher risk than women before age 45 years. After age 65, women are at higher risk than men. Risk factors you can control include:  Not getting enough exercise or physical activity.  Being overweight.  Getting too much fat, sugar, calories, or salt in your diet.  Drinking too much alcohol. SIGNS AND SYMPTOMS Hypertension does not usually cause signs or symptoms. Extremely high blood pressure (hypertensive crisis) may cause headache, anxiety, shortness of breath, and nosebleed. DIAGNOSIS To check if you have hypertension, your health care provider will measure your blood pressure while you are seated, with your arm held at the level of your heart. It should be measured at least twice using the same arm. Certain conditions can cause a difference in blood pressure between your right and left arms. A blood pressure reading that is higher than normal on one occasion does not mean that you need treatment. If  it is not clear whether you have high blood pressure, you may be asked to return on a different day to have your blood pressure checked again. Or, you may be asked to monitor your blood pressure at home for 1 or more weeks. TREATMENT Treating high blood pressure includes making lifestyle changes and possibly taking medicine. Living a healthy lifestyle can help lower high blood pressure. You may need to change some of your habits. Lifestyle changes may include:  Following the DASH diet. This diet is high in fruits, vegetables, and whole grains. It is low in salt, red meat, and added sugars.  Keep your sodium intake below 2,300 mg per day.  Getting at least 30-45 minutes of aerobic exercise at least 4 times per week.  Losing weight if necessary.  Not smoking.  Limiting alcoholic beverages.  Learning ways to reduce stress. Your health care provider may prescribe medicine if lifestyle changes are not enough to get your blood pressure under control, and if one of the following is true:  You are 18-59 years of age and your systolic blood pressure is above 140.  You are 60 years of age or older, and your systolic blood pressure is above 150.  Your diastolic blood pressure is above 90.  You have diabetes, and your systolic blood pressure is over 140 or your diastolic blood pressure is over 90.  You have kidney disease and your blood pressure is above 140/90.  You have heart disease and your blood pressure is above 140/90. Your personal target blood pressure may vary depending on your medical conditions, your age, and other factors. HOME CARE INSTRUCTIONS    Have your blood pressure rechecked as directed by your health care provider.   Take medicines only as directed by your health care provider. Follow the directions carefully. Blood pressure medicines must be taken as prescribed. The medicine does not work as well when you skip doses. Skipping doses also puts you at risk for  problems.  Do not smoke.   Monitor your blood pressure at home as directed by your health care provider. SEEK MEDICAL CARE IF:   You think you are having a reaction to medicines taken.  You have recurrent headaches or feel dizzy.  You have swelling in your ankles.  You have trouble with your vision. SEEK IMMEDIATE MEDICAL CARE IF:  You develop a severe headache or confusion.  You have unusual weakness, numbness, or feel faint.  You have severe chest or abdominal pain.  You vomit repeatedly.  You have trouble breathing. MAKE SURE YOU:   Understand these instructions.  Will watch your condition.  Will get help right away if you are not doing well or get worse.   This information is not intended to replace advice given to you by your health care provider. Make sure you discuss any questions you have with your health care provider.   Document Released: 04/01/2005 Document Revised: 08/16/2014 Document Reviewed: 01/22/2013 Elsevier Interactive Patient Education 2016 Elsevier Inc.  

## 2015-09-15 NOTE — Progress Notes (Signed)
Subjective:    Patient ID: Jesus Morrison, male    DOB: 1927-03-30, 82 y.o.   MRN: [id number]  HPI Jesus Morrison is an 82 year old male with a history of hypertension who comes into the clinic for a follow-up visit. He brings in all his medications today and has been compliant but has failed to pick up the new dose of 50 mg of hydralazine which I prescribed as his last visit and is currently taking the 25 mg. He is working on a low-sodium diet as well.  Past Medical History  Diagnosis Date  . Enlarged prostate   . Hypertension   . Cold (disease)     last week ( week of Dec 22- Dec 29 ) - no longer coughing, no fever and feels better now  . BPH (benign prostatic hypertrophy)     problems with acute urinary retention- has indwelling foley catheter  . Arthritis     fingers and hands    Past Surgical History  Procedure Laterality Date  .  right elbow surgery as a child    . Prostatectomy  04/17/2012    Procedure: PROSTATECTOMY RETROPUBIC;  Surgeon: Lindaann Slough, MD;  Location: [facility];  Service: Urology;  Laterality: N/A;  Simple Retropubic Prostatectomy      Social History   Social History  . Marital Status: Single    Spouse Name: N/A  . Number of Children: N/A  . Years of Education: N/A   Occupational History  . Not on file.   Social History Main Topics  . Smoking status: Never Smoker   . Smokeless tobacco: Never Used  . Alcohol Use: Yes     Comment: socially  . Drug Use: No  . Sexual Activity: Not on file   Other Topics Concern  . Not on file   Social History Narrative    No Known Allergies  Current Outpatient Prescriptions on File Prior to Visit  Medication Sig Dispense Refill  . amLODipine (NORVASC) 10 MG tablet Take 1 tablet (10 mg total) by mouth daily. 30 tablet 3  . furosemide (LASIX) 20 MG tablet Take 20 mg by mouth every morning. Reported on 09/01/2015    . hydrALAZINE (APRESOLINE) 50 MG tablet Take 1 tablet (50 mg total) by mouth 2 (two) times daily.  60 tablet 3  . rosuvastatin (CRESTOR) 20 MG tablet Take 20 mg by mouth daily. Reported on 09/01/2015    . Tamsulosin HCl (FLOMAX) 0.4 MG CAPS Take 0.4 mg by mouth daily. Reported on 09/01/2015    . valsartan-hydrochlorothiazide (DIOVAN HCT) 320-12.5 MG tablet Take 1 tablet by mouth daily. 30 tablet 3  . ibuprofen (ADVIL,MOTRIN) 800 MG tablet Take 1 tablet (800 mg total) by mouth 3 (three) times daily. (Patient not taking: Reported on 09/15/2015) 21 tablet 0  . polyethylene glycol powder (GLYCOLAX/MIRALAX) powder Take 17 g by mouth daily. (Patient not taking: Reported on 09/15/2015) 255 g 0   No current facility-administered medications on file prior to visit.     Review of Systems  Constitutional: Negative for activity change and appetite change.  HENT: Negative for sinus pressure and sore throat.   Respiratory: Negative for chest tightness, shortness of breath and wheezing.   Cardiovascular: Negative for chest pain and palpitations.  Gastrointestinal: Negative for abdominal pain, constipation and abdominal distention.  Genitourinary: Negative.   Musculoskeletal: Negative.   Psychiatric/Behavioral: Negative for behavioral problems and dysphoric mood.       Objective: Filed Vitals:   09/15/15 0920  BP: 160/69  Pulse: 62  Temp: 98.4 F (36.9 C)  Height: 5\' 5"  (1.651 m)  Weight: 173 lb (78.472 kg)       Physical Exam  Constitutional: He is oriented to person, place, and time. He appears well-developed and well-nourished.  Cardiovascular: Normal rate, normal heart sounds and intact distal pulses.   No murmur heard. Pulmonary/Chest: Effort normal and breath sounds normal. He has no wheezes. He has no rales. He exhibits no tenderness.  Abdominal: Soft. Bowel sounds are normal. He exhibits no distension and no mass. There is no tenderness.  Musculoskeletal: Normal range of motion.  Neurological: He is alert and oriented to person, place, and time.          Assessment & Plan:    Hypertension: Uncontrolled He is yet to pick continue dose of hydralazine which I have encouraged him to do. Continue amlodipine and losartan/HCTZ. Low-sodium, DASH diet Review blood pressure at next office visit

## 2015-10-27 ENCOUNTER — Ambulatory Visit: Payer: Commercial Managed Care - HMO | Attending: Family Medicine | Admitting: Family Medicine

## 2015-10-27 ENCOUNTER — Encounter: Payer: Self-pay | Admitting: Family Medicine

## 2015-10-27 VITALS — BP 144/63 | HR 59 | Temp 98.3°F | Ht 65.0 in | Wt 132.0 lb

## 2015-10-27 DIAGNOSIS — I10 Essential (primary) hypertension: Secondary | ICD-10-CM

## 2015-10-27 DIAGNOSIS — Z79899 Other long term (current) drug therapy: Secondary | ICD-10-CM | POA: Insufficient documentation

## 2015-10-27 LAB — COMPLETE METABOLIC PANEL WITH GFR
ALT: 9 U/L (ref 9–46)
AST: 12 U/L (ref 10–35)
Albumin: 3.6 g/dL (ref 3.6–5.1)
Alkaline Phosphatase: 36 U/L — ABNORMAL LOW (ref 40–115)
BUN: 25 mg/dL (ref 7–25)
CALCIUM: 8.7 mg/dL (ref 8.6–10.3)
CHLORIDE: 107 mmol/L (ref 98–110)
CO2: 26 mmol/L (ref 20–31)
Creat: 0.9 mg/dL (ref 0.70–1.11)
GFR, EST AFRICAN AMERICAN: 88 mL/min (ref >=60)
GFR, Est Non African American: 77 mL/min (ref >=60)
Glucose, Bld: 94 mg/dL (ref 65–99)
POTASSIUM: 4.1 mmol/L (ref 3.5–5.3)
Sodium: 143 mmol/L (ref 135–146)
Total Bilirubin: 0.8 mg/dL (ref 0.2–1.2)
Total Protein: 6.4 g/dL (ref 6.1–8.1)

## 2015-10-27 LAB — LIPID PANEL
CHOL/HDL RATIO: 2.6 ratio (ref <=5.0)
CHOLESTEROL: 144 mg/dL (ref 125–200)
HDL: 56 mg/dL (ref >=40)
LDL Cholesterol: 78 mg/dL (ref <130)
TRIGLYCERIDES: 51 mg/dL (ref <150)
VLDL: 10 mg/dL (ref <30)

## 2015-10-27 MED ORDER — HYDRALAZINE HCL 50 MG PO TABS: 50.0000 mg | ORAL_TABLET | Freq: Two times a day (BID) | ORAL | Status: DC

## 2015-10-27 MED ORDER — VALSARTAN-HYDROCHLOROTHIAZIDE 320-12.5 MG PO TABS: 1.0000 | ORAL_TABLET | Freq: Every day | ORAL | Status: DC

## 2015-10-27 MED ORDER — AMLODIPINE BESYLATE 10 MG PO TABS: 10.0000 mg | ORAL_TABLET | Freq: Every day | ORAL | Status: DC

## 2015-10-27 NOTE — Progress Notes (Signed)
Subjective:  Patient ID: Jesus Morrison, male    DOB: [birth year]  Age: 82 y.o. MRN: [id number]  CC: No chief complaint on file.   HPI Jesus Morrison is an 82 year old male with a history of hypertension who comes in for follow-up of his hypertension after medictation changes as his blood pressure has remained in the accelerated range. Blood pressure is controlled today and he has been compliant with his medications.  He has no complaints at this time Med list reveals he should be on Crestor which she has not been taking.  Outpatient Prescriptions Prior to Visit  Medication Sig Dispense Refill  . rosuvastatin (CRESTOR) 20 MG tablet Take 20 mg by mouth daily. Reported on 09/01/2015    . amLODipine (NORVASC) 10 MG tablet Take 1 tablet (10 mg total) by mouth daily. 30 tablet 3  . hydrALAZINE (APRESOLINE) 50 MG tablet Take 1 tablet (50 mg total) by mouth 2 (two) times daily. 60 tablet 3  . Tamsulosin HCl (FLOMAX) 0.4 MG CAPS Take 0.4 mg by mouth daily. Reported on 09/01/2015    . valsartan-hydrochlorothiazide (DIOVAN HCT) 320-12.5 MG tablet Take 1 tablet by mouth daily. 30 tablet 3  . furosemide (LASIX) 20 MG tablet Take 20 mg by mouth every morning. Reported on 10/27/2015    . ibuprofen (ADVIL,MOTRIN) 800 MG tablet Take 1 tablet (800 mg total) by mouth 3 (three) times daily. (Patient not taking: Reported on 09/15/2015) 21 tablet 0  . polyethylene glycol powder (GLYCOLAX/MIRALAX) powder Take 17 g by mouth daily. (Patient not taking: Reported on 09/15/2015) 255 g 0   No facility-administered medications prior to visit.    ROS Review of Systems  Constitutional: Negative for activity change and appetite change.  HENT: Negative for sinus pressure and sore throat.   Respiratory: Negative for chest tightness, shortness of breath and wheezing.   Cardiovascular: Negative for chest pain and palpitations.  Gastrointestinal: Negative for abdominal pain, constipation and abdominal distention.  Genitourinary:  Negative.   Musculoskeletal: Negative.   Psychiatric/Behavioral: Negative for behavioral problems and dysphoric mood.    Objective:  BP 144/63 mmHg  Pulse 59  Temp(Src) 98.3 F (36.8 C) (Oral)  Ht  (1.651 m)  Wt 132 lb (59.875 kg)  BMI 21.97 kg/m2  SpO2 99%  BP/Weight 10/27/2015 09/15/2015 09/01/2015  Systolic BP 144 160 160  Diastolic BP 63 69 68  Wt. (Lbs) 132 173 177.4  BMI 21.97 28.79 29.52      Physical Exam  Constitutional: He is oriented to person, place, and time. He appears well-developed and well-nourished.  Cardiovascular: Normal rate, normal heart sounds and intact distal pulses.   No murmur heard. Pulmonary/Chest: Effort normal and breath sounds normal. He has no wheezes. He has no rales. He exhibits no tenderness.  Abdominal: Soft. Bowel sounds are normal. He exhibits no distension and no mass. There is no tenderness.  Musculoskeletal: Normal range of motion.  Neurological: He is alert and oriented to person, place, and time.     Assessment & Plan:   1. Essential hypertension Slightly above goal of less than 140/90 We'll make no regimen changes. Lipid panel sent off and if abnormal I will place him on atorvastatin. - COMPLETE METABOLIC PANEL WITH GFR - Lipid panel - valsartan-hydrochlorothiazide (DIOVAN HCT) 320-12.5 MG tablet; Take 1 tablet by mouth daily.  Dispense: 30 tablet; Refill: 3 - hydrALAZINE (APRESOLINE) 50 MG tablet; Take 1 tablet (50 mg total) by mouth 2 (two) times daily.  Dispense: 60 tablet; Refill: 3 -  amLODipine (NORVASC) 10 MG tablet; Take 1 tablet (10 mg total) by mouth daily.  Dispense: 30 tablet; Refill: 3   Meds ordered this encounter  Medications  . valsartan-hydrochlorothiazide (DIOVAN HCT) 320-12.5 MG tablet    Sig: Take 1 tablet by mouth daily.    Dispense:  30 tablet    Refill:  3  . hydrALAZINE (APRESOLINE) 50 MG tablet    Sig: Take 1 tablet (50 mg total) by mouth 2 (two) times daily.    Dispense:  60 tablet     Refill:  3    Discontinue previous dose  . amLODipine (NORVASC) 10 MG tablet    Sig: Take 1 tablet (10 mg total) by mouth daily.    Dispense:  30 tablet    Refill:  3    Follow-up: Return in about 3 months (around 01/27/2016) for Follow-up of hypertension.   Jaclyn ShaggyEnobong Amao MD

## 2015-10-27 NOTE — Patient Instructions (Signed)
Hypertension Hypertension, commonly called high blood pressure, is when the force of blood pumping through your arteries is too strong. Your arteries are the blood vessels that carry blood from your heart throughout your body. A blood pressure reading consists of a higher number over a lower number, such as 110/72. The higher number (systolic) is the pressure inside your arteries when your heart pumps. The lower number (diastolic) is the pressure inside your arteries when your heart relaxes. Ideally you want your blood pressure below 120/80. Hypertension forces your heart to work harder to pump blood. Your arteries may become narrow or stiff. Having untreated or uncontrolled hypertension can cause heart attack, stroke, kidney disease, and other problems. RISK FACTORS Some risk factors for high blood pressure are controllable. Others are not.  Risk factors you cannot control include:   Race. You may be at higher risk if you are African American.  Age. Risk increases with age.  Gender. Men are at higher risk than women before age 45 years. After age 65, women are at higher risk than men. Risk factors you can control include:  Not getting enough exercise or physical activity.  Being overweight.  Getting too much fat, sugar, calories, or salt in your diet.  Drinking too much alcohol. SIGNS AND SYMPTOMS Hypertension does not usually cause signs or symptoms. Extremely high blood pressure (hypertensive crisis) may cause headache, anxiety, shortness of breath, and nosebleed. DIAGNOSIS To check if you have hypertension, your health care provider will measure your blood pressure while you are seated, with your arm held at the level of your heart. It should be measured at least twice using the same arm. Certain conditions can cause a difference in blood pressure between your right and left arms. A blood pressure reading that is higher than normal on one occasion does not mean that you need treatment. If  it is not clear whether you have high blood pressure, you may be asked to return on a different day to have your blood pressure checked again. Or, you may be asked to monitor your blood pressure at home for 1 or more weeks. TREATMENT Treating high blood pressure includes making lifestyle changes and possibly taking medicine. Living a healthy lifestyle can help lower high blood pressure. You may need to change some of your habits. Lifestyle changes may include:  Following the DASH diet. This diet is high in fruits, vegetables, and whole grains. It is low in salt, red meat, and added sugars.  Keep your sodium intake below 2,300 mg per day.  Getting at least 30-45 minutes of aerobic exercise at least 4 times per week.  Losing weight if necessary.  Not smoking.  Limiting alcoholic beverages.  Learning ways to reduce stress. Your health care provider may prescribe medicine if lifestyle changes are not enough to get your blood pressure under control, and if one of the following is true:  You are 18-59 years of age and your systolic blood pressure is above 140.  You are 60 years of age or older, and your systolic blood pressure is above 150.  Your diastolic blood pressure is above 90.  You have diabetes, and your systolic blood pressure is over 140 or your diastolic blood pressure is over 90.  You have kidney disease and your blood pressure is above 140/90.  You have heart disease and your blood pressure is above 140/90. Your personal target blood pressure may vary depending on your medical conditions, your age, and other factors. HOME CARE INSTRUCTIONS    Have your blood pressure rechecked as directed by your health care provider.   Take medicines only as directed by your health care provider. Follow the directions carefully. Blood pressure medicines must be taken as prescribed. The medicine does not work as well when you skip doses. Skipping doses also puts you at risk for  problems.  Do not smoke.   Monitor your blood pressure at home as directed by your health care provider. SEEK MEDICAL CARE IF:   You think you are having a reaction to medicines taken.  You have recurrent headaches or feel dizzy.  You have swelling in your ankles.  You have trouble with your vision. SEEK IMMEDIATE MEDICAL CARE IF:  You develop a severe headache or confusion.  You have unusual weakness, numbness, or feel faint.  You have severe chest or abdominal pain.  You vomit repeatedly.  You have trouble breathing. MAKE SURE YOU:   Understand these instructions.  Will watch your condition.  Will get help right away if you are not doing well or get worse.   This information is not intended to replace advice given to you by your health care provider. Make sure you discuss any questions you have with your health care provider.   Document Released: 04/01/2005 Document Revised: 08/16/2014 Document Reviewed: 01/22/2013 Elsevier Interactive Patient Education 2016 Elsevier Inc.  

## 2015-10-30 ENCOUNTER — Telehealth: Payer: Self-pay

## 2015-10-30 NOTE — Telephone Encounter (Signed)
-----   Message from Yancey FlemingsKimberly R Becton, RN sent at 10/30/2015  2:42 PM EDT ----- Normal Lab results

## 2015-10-30 NOTE — Telephone Encounter (Signed)
Attempting to provide patient: Normal Lab results

## 2016-01-26 ENCOUNTER — Ambulatory Visit: Payer: Commercial Managed Care - HMO | Attending: Family Medicine | Admitting: Family Medicine

## 2016-01-26 ENCOUNTER — Encounter: Payer: Self-pay | Admitting: Family Medicine

## 2016-01-26 DIAGNOSIS — Z79899 Other long term (current) drug therapy: Secondary | ICD-10-CM | POA: Insufficient documentation

## 2016-01-26 DIAGNOSIS — R001 Bradycardia, unspecified: Secondary | ICD-10-CM | POA: Diagnosis not present

## 2016-01-26 DIAGNOSIS — Z9889 Other specified postprocedural states: Secondary | ICD-10-CM | POA: Insufficient documentation

## 2016-01-26 DIAGNOSIS — I10 Essential (primary) hypertension: Secondary | ICD-10-CM

## 2016-01-26 MED ORDER — AMLODIPINE BESYLATE 10 MG PO TABS: 10.0000 mg | ORAL_TABLET | Freq: Every day | ORAL | 3 refills | Status: DC

## 2016-01-26 MED ORDER — VALSARTAN-HYDROCHLOROTHIAZIDE 320-12.5 MG PO TABS: 1.0000 | ORAL_TABLET | Freq: Every day | ORAL | 3 refills | Status: DC

## 2016-01-26 MED ORDER — HYDRALAZINE HCL 50 MG PO TABS: 50.0000 mg | ORAL_TABLET | Freq: Two times a day (BID) | ORAL | 3 refills | Status: DC

## 2016-01-26 NOTE — Patient Instructions (Signed)
Hypertension Hypertension, commonly called high blood pressure, is when the force of blood pumping through your arteries is too strong. Your arteries are the blood vessels that carry blood from your heart throughout your body. A blood pressure reading consists of a higher number over a lower number, such as 110/72. The higher number (systolic) is the pressure inside your arteries when your heart pumps. The lower number (diastolic) is the pressure inside your arteries when your heart relaxes. Ideally you want your blood pressure below 120/80. Hypertension forces your heart to work harder to pump blood. Your arteries may become narrow or stiff. Having untreated or uncontrolled hypertension can cause heart attack, stroke, kidney disease, and other problems. RISK FACTORS Some risk factors for high blood pressure are controllable. Others are not.  Risk factors you cannot control include:   Race. You may be at higher risk if you are African American.  Age. Risk increases with age.  Gender. Men are at higher risk than women before age 45 years. After age 65, women are at higher risk than men. Risk factors you can control include:  Not getting enough exercise or physical activity.  Being overweight.  Getting too much fat, sugar, calories, or salt in your diet.  Drinking too much alcohol. SIGNS AND SYMPTOMS Hypertension does not usually cause signs or symptoms. Extremely high blood pressure (hypertensive crisis) may cause headache, anxiety, shortness of breath, and nosebleed. DIAGNOSIS To check if you have hypertension, your health care provider will measure your blood pressure while you are seated, with your arm held at the level of your heart. It should be measured at least twice using the same arm. Certain conditions can cause a difference in blood pressure between your right and left arms. A blood pressure reading that is higher than normal on one occasion does not mean that you need treatment. If  it is not clear whether you have high blood pressure, you may be asked to return on a different day to have your blood pressure checked again. Or, you may be asked to monitor your blood pressure at home for 1 or more weeks. TREATMENT Treating high blood pressure includes making lifestyle changes and possibly taking medicine. Living a healthy lifestyle can help lower high blood pressure. You may need to change some of your habits. Lifestyle changes may include:  Following the DASH diet. This diet is high in fruits, vegetables, and whole grains. It is low in salt, red meat, and added sugars.  Keep your sodium intake below 2,300 mg per day.  Getting at least 30-45 minutes of aerobic exercise at least 4 times per week.  Losing weight if necessary.  Not smoking.  Limiting alcoholic beverages.  Learning ways to reduce stress. Your health care provider may prescribe medicine if lifestyle changes are not enough to get your blood pressure under control, and if one of the following is true:  You are 18-59 years of age and your systolic blood pressure is above 140.  You are 60 years of age or older, and your systolic blood pressure is above 150.  Your diastolic blood pressure is above 90.  You have diabetes, and your systolic blood pressure is over 140 or your diastolic blood pressure is over 90.  You have kidney disease and your blood pressure is above 140/90.  You have heart disease and your blood pressure is above 140/90. Your personal target blood pressure may vary depending on your medical conditions, your age, and other factors. HOME CARE INSTRUCTIONS    Have your blood pressure rechecked as directed by your health care provider.   Take medicines only as directed by your health care provider. Follow the directions carefully. Blood pressure medicines must be taken as prescribed. The medicine does not work as well when you skip doses. Skipping doses also puts you at risk for  problems.  Do not smoke.   Monitor your blood pressure at home as directed by your health care provider. SEEK MEDICAL CARE IF:   You think you are having a reaction to medicines taken.  You have recurrent headaches or feel dizzy.  You have swelling in your ankles.  You have trouble with your vision. SEEK IMMEDIATE MEDICAL CARE IF:  You develop a severe headache or confusion.  You have unusual weakness, numbness, or feel faint.  You have severe chest or abdominal pain.  You vomit repeatedly.  You have trouble breathing. MAKE SURE YOU:   Understand these instructions.  Will watch your condition.  Will get help right away if you are not doing well or get worse.   This information is not intended to replace advice given to you by your health care provider. Make sure you discuss any questions you have with your health care provider.   Document Released: 04/01/2005 Document Revised: 08/16/2014 Document Reviewed: 01/22/2013 Elsevier Interactive Patient Education 2016 Elsevier Inc.  

## 2016-01-26 NOTE — Progress Notes (Signed)
Subjective:  Patient ID: Jesus Morrison, male    DOB: 1927/02/07  Age: 82 y.o. MRN: [id number]  CC: Hypertension   HPI Turner DanielsGentry Panella Ethan 82 year old male with a history of hypertension who presents to the clinic for follow-up visit. He brings in all his medications with him and has been compliant with all of them; on review of his medication it appears he has been taking hydralazine 50 mg twice daily as well as hydralazine 25 mg twice daily in addition to amlodipine and Diovan/HCTZ.  He has no complaints today and declines getting the flu shot which he states he will be receiving at work next Thursday.  Past Medical History:  Diagnosis Date  . Arthritis    fingers and hands  . BPH (benign prostatic hypertrophy)    problems with acute urinary retention- has indwelling foley catheter  . Cold (disease)    last week ( week of Dec 22- Dec 29 ) - no longer coughing, no fever and feels better now  . Enlarged prostate   . Hypertension     Past Surgical History:  Procedure Laterality Date  .  Right elbow surgery as a child    . PROSTATECTOMY  04/17/2012   Procedure: PROSTATECTOMY RETROPUBIC;  Surgeon: Lindaann SloughMarc-Henry Nesi, MD;  Location: [facility];  Service: Urology;  Laterality: N/A;  Simple Retropubic Prostatectomy      No Known Allergies   Outpatient Medications Prior to Visit  Medication Sig Dispense Refill  . amLODipine (NORVASC) 10 MG tablet Take 1 tablet (10 mg total) by mouth daily. 30 tablet 3  . hydrALAZINE (APRESOLINE) 50 MG tablet Take 1 tablet (50 mg total) by mouth 2 (two) times daily. 60 tablet 3  . valsartan-hydrochlorothiazide (DIOVAN HCT) 320-12.5 MG tablet Take 1 tablet by mouth daily. 30 tablet 3   No facility-administered medications prior to visit.     ROS Review of Systems  Constitutional: Negative for activity change and appetite change.  HENT: Negative for sinus pressure and sore throat.   Eyes: Negative for visual disturbance.  Respiratory: Negative for cough,  chest tightness and shortness of breath.   Cardiovascular: Negative for chest pain and leg swelling.  Gastrointestinal: Negative for abdominal distention, abdominal pain, constipation and diarrhea.  Endocrine: Negative.   Genitourinary: Negative for dysuria.  Musculoskeletal: Negative for joint swelling and myalgias.  Skin: Negative for rash.  Allergic/Immunologic: Negative.   Neurological: Negative for weakness, light-headedness and numbness.  Psychiatric/Behavioral: Negative for dysphoric mood and suicidal ideas.    Objective:  BP (!) 143/50 (BP Location: Right Arm, Patient Position: Sitting, Cuff Size: Large)   Pulse (!) 48   Temp 98.2 F (36.8 C) (Oral)   Ht 5\' 5"  (1.651 m)   Wt 176 lb 3.2 oz (79.9 kg)   SpO2 98%   BMI 29.32 kg/m   BP/Weight 01/26/2016 10/27/2015 09/15/2015  Systolic BP 143 144 160  Diastolic BP 50 63 69  Wt. (Lbs) 176.2 132 173  BMI 29.32 21.97 28.79      Physical Exam  Constitutional: He is oriented to person, place, and time. He appears well-developed and well-nourished.  Cardiovascular: Normal heart sounds and intact distal pulses.  Bradycardia present.   No murmur heard. Pulmonary/Chest: Effort normal and breath sounds normal. He has no wheezes. He has no rales. He exhibits no tenderness.  Abdominal: Soft. Bowel sounds are normal. He exhibits no distension and no mass. There is no tenderness.  Musculoskeletal: Normal range of motion.  Neurological: He is alert and  oriented to person, place, and time.    CMP Latest Ref Rng & Units 10/27/2015 08/04/2015 04/20/2012  Glucose 65 - 99 mg/dL 94 161(W) 960(A)  BUN 7 - 25 mg/dL 25 14 9   Creatinine 0.70 - 1.11 mg/dL 5.40 9.81 1.91  Sodium 135 - 146 mmol/L 143 141 135  Potassium 3.5 - 5.3 mmol/L 4.1 3.7 4.1  Chloride 98 - 110 mmol/L 107 105 101  CO2 20 - 31 mmol/L 26 25 31   Calcium 8.6 - 10.3 mg/dL 8.7 8.9 4.7(W)  Total Protein 6.1 - 8.1 g/dL 6.4 2.9(F) -  Total Bilirubin 0.2 - 1.2 mg/dL 0.8 0.9 -    Alkaline Phos 40 - 115 U/L 36(L) 39 -  AST 10 - 35 U/L 12 16 -  ALT 9 - 46 U/L 9 12(L) -    Lipid Panel     Component Value Date/Time   CHOL 144 10/27/2015 0943   TRIG 51 10/27/2015 0943   HDL 56 10/27/2015 0943   CHOLHDL 2.6 10/27/2015 0943   VLDL 10 10/27/2015 0943   LDLCALC 78 10/27/2015 0943    Assessment & Plan:   1. Essential hypertension Slightly above goal of less than 140/90 Low-sodium, DASH diet Advised against taking both the 25 mg tablets of hydralazine as well as to 50 mg. Continue just the 50 mg tablets and discomfort at the former. - amLODipine (NORVASC) 10 MG tablet; Take 1 tablet (10 mg total) by mouth daily.  Dispense: 30 tablet; Refill: 3 - valsartan-hydrochlorothiazide (DIOVAN HCT) 320-12.5 MG tablet; Take 1 tablet by mouth daily.  Dispense: 30 tablet; Refill: 3 - hydrALAZINE (APRESOLINE) 50 MG tablet; Take 1 tablet (50 mg total) by mouth 2 (two) times daily.  Dispense: 60 tablet; Refill: 3  2. Bradycardia He has sinus bradycardia Asymptomatic   Meds ordered this encounter  Medications  . amLODipine (NORVASC) 10 MG tablet    Sig: Take 1 tablet (10 mg total) by mouth daily.    Dispense:  30 tablet    Refill:  3  . valsartan-hydrochlorothiazide (DIOVAN HCT) 320-12.5 MG tablet    Sig: Take 1 tablet by mouth daily.    Dispense:  30 tablet    Refill:  3  . hydrALAZINE (APRESOLINE) 50 MG tablet    Sig: Take 1 tablet (50 mg total) by mouth 2 (two) times daily.    Dispense:  60 tablet    Refill:  3    Discontinue previous dose    Follow-up: Return in about 3 months (around 04/27/2016) for Follow-up on hypertension.   Jaclyn Shaggy MD

## 2016-01-26 NOTE — Progress Notes (Signed)
Medication refill

## 2016-04-19 ENCOUNTER — Ambulatory Visit: Payer: Commercial Managed Care - HMO | Attending: Family Medicine | Admitting: Family Medicine

## 2016-04-19 ENCOUNTER — Encounter: Payer: Self-pay | Admitting: Family Medicine

## 2016-04-19 VITALS — BP 160/60 | HR 56 | Temp 97.5°F | Ht 65.0 in | Wt 181.2 lb

## 2016-04-19 DIAGNOSIS — I10 Essential (primary) hypertension: Secondary | ICD-10-CM | POA: Diagnosis not present

## 2016-04-19 DIAGNOSIS — R05 Cough: Secondary | ICD-10-CM

## 2016-04-19 DIAGNOSIS — N400 Enlarged prostate without lower urinary tract symptoms: Secondary | ICD-10-CM | POA: Diagnosis not present

## 2016-04-19 DIAGNOSIS — R059 Cough, unspecified: Secondary | ICD-10-CM

## 2016-04-19 DIAGNOSIS — M19041 Primary osteoarthritis, right hand: Secondary | ICD-10-CM | POA: Diagnosis not present

## 2016-04-19 DIAGNOSIS — M19042 Primary osteoarthritis, left hand: Secondary | ICD-10-CM | POA: Insufficient documentation

## 2016-04-19 MED ORDER — HYDRALAZINE HCL 50 MG PO TABS: 50.0000 mg | ORAL_TABLET | Freq: Two times a day (BID) | ORAL | 3 refills | Status: DC

## 2016-04-19 MED ORDER — AMLODIPINE BESYLATE 10 MG PO TABS: 10.0000 mg | ORAL_TABLET | Freq: Every day | ORAL | 3 refills | Status: DC

## 2016-04-19 MED ORDER — CETIRIZINE HCL 10 MG PO TABS: 10.0000 mg | ORAL_TABLET | Freq: Every day | ORAL | 1 refills | Status: DC

## 2016-04-19 MED ORDER — VALSARTAN-HYDROCHLOROTHIAZIDE 320-12.5 MG PO TABS: 1.0000 | ORAL_TABLET | Freq: Every day | ORAL | 3 refills | Status: DC

## 2016-04-19 NOTE — Progress Notes (Signed)
Subjective:  Patient ID: Jesus Morrison, male    DOB: 11-23-1926  Age: 83 y.o. MRN: [id number]  CC: Hypertension and Cough (mostly at night)   HPI Jesus BarmanGentry Morrison an 83 year old male with a history of hypertension who comes into the clinic for follow-up visit. His blood pressure is elevated today and he has been compliant with his medications however he endorses eating a huge breakfast because of vacation.  He complains of nonproductive cough which is worse at night but denies shortness of breath or wheezing. He denies sinus tenderness or ear pain.  Past Medical History:  Diagnosis Date  . Arthritis    fingers and hands  . BPH (benign prostatic hypertrophy)    problems with acute urinary retention- has indwelling foley catheter  . Cold (disease)    last week ( week of Dec 22- Dec 29 ) - no longer coughing, no fever and feels better now  . Enlarged prostate   . Hypertension     Past Surgical History:  Procedure Laterality Date  .  Right elbow surgery as a child    . PROSTATECTOMY  04/17/2012   Procedure: PROSTATECTOMY RETROPUBIC;  Surgeon: Lindaann SloughMarc-Henry Nesi, MD;  Location: [facility];  Service: Urology;  Laterality: N/A;  Simple Retropubic Prostatectomy      No Known Allergies  Outpatient Medications Prior to Visit  Medication Sig Dispense Refill  . amLODipine (NORVASC) 10 MG tablet Take 1 tablet (10 mg total) by mouth daily. 30 tablet 3  . hydrALAZINE (APRESOLINE) 50 MG tablet Take 1 tablet (50 mg total) by mouth 2 (two) times daily. 60 tablet 3  . valsartan-hydrochlorothiazide (DIOVAN HCT) 320-12.5 MG tablet Take 1 tablet by mouth daily. 30 tablet 3   No facility-administered medications prior to visit.     ROS Review of Systems  Constitutional: Negative for activity change and appetite change.  HENT: Negative for sinus pressure and sore throat.   Eyes: Negative for visual disturbance.  Respiratory: Negative for cough, chest tightness and shortness of breath.   Cardiovascular:  Negative for chest pain and leg swelling.  Gastrointestinal: Negative for abdominal distention, abdominal pain, constipation and diarrhea.  Endocrine: Negative.   Genitourinary: Negative for dysuria.  Musculoskeletal: Negative for joint swelling and myalgias.  Skin: Negative for rash.  Allergic/Immunologic: Negative.   Neurological: Negative for weakness, light-headedness and numbness.  Psychiatric/Behavioral: Negative for dysphoric mood and suicidal ideas.    Objective:  BP (!) 160/60 (BP Location: Right Arm, Patient Position: Sitting, Cuff Size: Small)   Pulse (!) 56   Temp 97.5 F (36.4 C) (Oral)   Ht 5\' 5"  (1.651 m)   Wt 181 lb 3.2 oz (82.2 kg)   SpO2 99%   BMI 30.15 kg/m   BP/Weight 04/19/2016 01/26/2016 10/27/2015  Systolic BP 160 143 144  Diastolic BP 60 50 63  Wt. (Lbs) 181.2 176.2 132  BMI 30.15 29.32 21.97     Physical Exam  Constitutional: He is oriented to person, place, and time. He appears well-developed and well-nourished.  Cardiovascular: Normal heart sounds and intact distal pulses.  Bradycardia present.   No murmur heard. Pulmonary/Chest: Effort normal and breath sounds normal. He has no wheezes. He has no rales. He exhibits no tenderness.  Abdominal: Soft. Bowel sounds are normal. He exhibits no distension and no mass. There is no tenderness.  Musculoskeletal: Normal range of motion.  Neurological: He is alert and oriented to person, place, and time.     Assessment & Plan:   1. Essential  hypertension Uncontrolled Likely due to ingestion of sodium Encouraged to adhere to low-sodium diet. - valsartan-hydrochlorothiazide (DIOVAN HCT) 320-12.5 MG tablet; Take 1 tablet by mouth daily.  Dispense: 30 tablet; Refill: 3 - hydrALAZINE (APRESOLINE) 50 MG tablet; Take 1 tablet (50 mg total) by mouth 2 (two) times daily.  Dispense: 60 tablet; Refill: 3 - amLODipine (NORVASC) 10 MG tablet; Take 1 tablet (10 mg total) by mouth daily.  Dispense: 30 tablet; Refill:  3 - Basic Metabolic Panel  2. Cough Will treat for post nasal drip - cetirizine (ZYRTEC) 10 MG tablet; Take 1 tablet (10 mg total) by mouth daily.  Dispense: 30 tablet; Refill: 1   Meds ordered this encounter  Medications  . valsartan-hydrochlorothiazide (DIOVAN HCT) 320-12.5 MG tablet    Sig: Take 1 tablet by mouth daily.    Dispense:  30 tablet    Refill:  3  . hydrALAZINE (APRESOLINE) 50 MG tablet    Sig: Take 1 tablet (50 mg total) by mouth 2 (two) times daily.    Dispense:  60 tablet    Refill:  3    Discontinue previous dose  . amLODipine (NORVASC) 10 MG tablet    Sig: Take 1 tablet (10 mg total) by mouth daily.    Dispense:  30 tablet    Refill:  3  . cetirizine (ZYRTEC) 10 MG tablet    Sig: Take 1 tablet (10 mg total) by mouth daily.    Dispense:  30 tablet    Refill:  1    Follow-up: Return in about 3 months (around 07/18/2016) for Follow-up on hypertension.   Jaclyn Shaggy MD

## 2016-07-26 ENCOUNTER — Ambulatory Visit: Payer: Commercial Managed Care - HMO | Attending: Family Medicine | Admitting: Family Medicine

## 2016-07-26 ENCOUNTER — Encounter: Payer: Self-pay | Admitting: Family Medicine

## 2016-07-26 VITALS — BP 158/53 | HR 55 | Temp 97.8°F | Ht 65.0 in | Wt 179.6 lb

## 2016-07-26 DIAGNOSIS — M19042 Primary osteoarthritis, left hand: Secondary | ICD-10-CM | POA: Diagnosis not present

## 2016-07-26 DIAGNOSIS — N400 Enlarged prostate without lower urinary tract symptoms: Secondary | ICD-10-CM | POA: Insufficient documentation

## 2016-07-26 DIAGNOSIS — K5909 Other constipation: Secondary | ICD-10-CM

## 2016-07-26 DIAGNOSIS — I10 Essential (primary) hypertension: Secondary | ICD-10-CM | POA: Diagnosis not present

## 2016-07-26 DIAGNOSIS — M19041 Primary osteoarthritis, right hand: Secondary | ICD-10-CM | POA: Diagnosis not present

## 2016-07-26 DIAGNOSIS — K5900 Constipation, unspecified: Secondary | ICD-10-CM | POA: Insufficient documentation

## 2016-07-26 MED ORDER — LACTULOSE 10 GM/15ML PO SOLN: 10.0000 g | Freq: Two times a day (BID) | ORAL | 1 refills | Status: DC | PRN

## 2016-07-26 MED ORDER — HYDRALAZINE HCL 100 MG PO TABS: 100.0000 mg | ORAL_TABLET | Freq: Two times a day (BID) | ORAL | 3 refills | Status: DC

## 2016-07-26 NOTE — Patient Instructions (Signed)

## 2016-07-26 NOTE — Progress Notes (Signed)
Subjective:  Patient ID: Jesus Morrison, male    DOB: 1926-08-30  Age: 83 y.o. MRN: [id number]  CC: Hypertension and Constipation (stool is hard)   HPI Jesus Morrison is an 83 year old male who presents for follow-up visit of his hypertension. Endorses compliance with his antihypertensives however his blood pressure is still elevated. Compliant with low-sodium diet but doesn't exercise.  Also complains of constipation and has to strain to use the bathroom. Denies rectal bleed or abdominal pain; has no nausea or vomiting.  He has no other complaints today.  Past Medical History:  Diagnosis Date  . Arthritis    fingers and hands  . BPH (benign prostatic hypertrophy)    problems with acute urinary retention- has indwelling foley catheter  . Cold (disease)    last week ( week of Dec 22- Dec 29 ) - no longer coughing, no fever and feels better now  . Enlarged prostate   . Hypertension     Past Surgical History:  Procedure Laterality Date  .  Right elbow surgery as a child    . PROSTATECTOMY  04/17/2012   Procedure: PROSTATECTOMY RETROPUBIC;  Surgeon: Hanley Ben, MD;  Location: [facility];  Service: Urology;  Laterality: N/A;  Simple Retropubic Prostatectomy      No Known Allergies   Outpatient Medications Prior to Visit  Medication Sig Dispense Refill  . amLODipine (NORVASC) 10 MG tablet Take 1 tablet (10 mg total) by mouth daily. 30 tablet 3  . valsartan-hydrochlorothiazide (DIOVAN HCT) 320-12.5 MG tablet Take 1 tablet by mouth daily. 30 tablet 3  . hydrALAZINE (APRESOLINE) 50 MG tablet Take 1 tablet (50 mg total) by mouth 2 (two) times daily. 60 tablet 3  . cetirizine (ZYRTEC) 10 MG tablet Take 1 tablet (10 mg total) by mouth daily. (Patient not taking: Reported on 07/26/2016) 30 tablet 1   No facility-administered medications prior to visit.     ROS Review of Systems  Constitutional: Negative for activity change and appetite change.  HENT: Negative for sinus pressure and  sore throat.   Eyes: Negative for visual disturbance.  Respiratory: Negative for cough, chest tightness and shortness of breath.   Cardiovascular: Negative for chest pain and leg swelling.  Gastrointestinal: Positive for constipation. Negative for abdominal distention, abdominal pain and diarrhea.  Endocrine: Negative.   Genitourinary: Negative for dysuria.  Musculoskeletal: Negative for joint swelling and myalgias.  Skin: Negative for rash.  Allergic/Immunologic: Negative.   Neurological: Negative for weakness, light-headedness and numbness.  Psychiatric/Behavioral: Negative for dysphoric mood and suicidal ideas.    Objective:  BP (!) 158/53 (BP Location: Right Arm, Patient Position: Sitting, Cuff Size: Small)   Pulse (!) 55   Temp 97.8 F (36.6 C) (Oral)   Ht _0  (1.651 m)   Wt 179 lb 9.6 oz (81.5 kg)   SpO2 99%   BMI 29.89 kg/m   BP/Weight 07/26/2016 04/19/2016 49/70/2637  Systolic BP 858 850 277  Diastolic BP 53 60 50  Wt. (Lbs) 179.6 181.2 176.2  BMI 29.89 30.15 29.32      Physical Exam  Constitutional: He is oriented to person, place, and time. He appears well-developed and well-nourished.  Cardiovascular: Normal rate, normal heart sounds and intact distal pulses.   No murmur heard. Pulmonary/Chest: Effort normal and breath sounds normal. He has no wheezes. He has no rales. He exhibits no tenderness.  Abdominal: Soft. Bowel sounds are normal. He exhibits no distension and no mass. There is no tenderness.  Musculoskeletal: Normal range  of motion.  Neurological: He is alert and oriented to person, place, and time.     Assessment & Plan:   1. Essential hypertension Uncontrolled Increased dose of hydralazine - hydrALAZINE (APRESOLINE) 100 MG tablet; Take 1 tablet (100 mg total) by mouth 2 (two) times daily.  Dispense: 60 tablet; Refill: 3 - CMP14+EGFR  2. Other constipation Increase dietary intake of fruits and vegetables as well as fiber - lactulose  (CHRONULAC) 10 GM/15ML solution; Take 15 mLs (10 g total) by mouth 2 (two) times daily as needed for mild constipation.  Dispense: 946 mL; Refill: 1   Meds ordered this encounter  Medications  . hydrALAZINE (APRESOLINE) 100 MG tablet    Sig: Take 1 tablet (100 mg total) by mouth 2 (two) times daily.    Dispense:  60 tablet    Refill:  3    Discontinue previous dose  . lactulose (CHRONULAC) 10 GM/15ML solution    Sig: Take 15 mLs (10 g total) by mouth 2 (two) times daily as needed for mild constipation.    Dispense:  946 mL    Refill:  1    Follow-up: Return in about 3 months (around 10/25/2016) for follow up on Hypertension.   Arnoldo Morale MD

## 2016-07-27 LAB — CMP14+EGFR
A/G RATIO: 1.5 (ref 1.2–2.2)
ALT: 10 IU/L (ref 0–44)
AST: 16 IU/L (ref 0–40)
Albumin: 4.2 g/dL (ref 3.5–4.7)
Alkaline Phosphatase: 53 IU/L (ref 39–117)
BUN / CREAT RATIO: 20 (ref 10–24)
BUN: 17 mg/dL (ref 8–27)
Bilirubin Total: 0.7 mg/dL (ref 0.0–1.2)
CALCIUM: 9.3 mg/dL (ref 8.6–10.2)
CO2: 25 mmol/L (ref 18–29)
CREATININE: 0.84 mg/dL (ref 0.76–1.27)
Chloride: 102 mmol/L (ref 96–106)
GFR, EST AFRICAN AMERICAN: 91 mL/min/{1.73_m2} (ref >59)
GFR, EST NON AFRICAN AMERICAN: 79 mL/min/{1.73_m2} (ref >59)
GLOBULIN, TOTAL: 2.8 g/dL (ref 1.5–4.5)
Glucose: 86 mg/dL (ref 65–99)
POTASSIUM: 4.5 mmol/L (ref 3.5–5.2)
SODIUM: 140 mmol/L (ref 134–144)
Total Protein: 7 g/dL (ref 6.0–8.5)

## 2016-07-29 ENCOUNTER — Telehealth: Payer: Self-pay | Admitting: *Deleted

## 2016-07-29 NOTE — Telephone Encounter (Signed)
-----   Message from Jaclyn Shaggy, MD sent at 07/29/2016  1:23 PM EDT ----- Labs are normal.

## 2016-07-29 NOTE — Telephone Encounter (Signed)
MA unable to reach patient or leave a voicemail due to phone line ringing and disconnecting without VM option.  !!!Please inform patient of labs being normal!!!

## 2016-09-06 ENCOUNTER — Other Ambulatory Visit: Payer: Self-pay | Admitting: Family Medicine

## 2016-09-06 DIAGNOSIS — I10 Essential (primary) hypertension: Secondary | ICD-10-CM

## 2016-11-01 ENCOUNTER — Ambulatory Visit: Payer: Medicare Other | Admitting: Family Medicine

## 2016-11-08 ENCOUNTER — Ambulatory Visit: Payer: Commercial Managed Care - HMO | Attending: Family Medicine | Admitting: Family Medicine

## 2016-11-08 ENCOUNTER — Encounter: Payer: Self-pay | Admitting: Family Medicine

## 2016-11-08 DIAGNOSIS — N400 Enlarged prostate without lower urinary tract symptoms: Secondary | ICD-10-CM | POA: Diagnosis not present

## 2016-11-08 DIAGNOSIS — I10 Essential (primary) hypertension: Secondary | ICD-10-CM | POA: Diagnosis not present

## 2016-11-08 DIAGNOSIS — Z79899 Other long term (current) drug therapy: Secondary | ICD-10-CM | POA: Diagnosis not present

## 2016-11-08 DIAGNOSIS — Z9889 Other specified postprocedural states: Secondary | ICD-10-CM | POA: Insufficient documentation

## 2016-11-08 MED ORDER — LISINOPRIL-HYDROCHLOROTHIAZIDE 20-25 MG PO TABS: 1.0000 | ORAL_TABLET | Freq: Every day | ORAL | 5 refills | Status: DC

## 2016-11-08 MED ORDER — AMLODIPINE BESYLATE 10 MG PO TABS: 10.0000 mg | ORAL_TABLET | Freq: Every day | ORAL | 5 refills | Status: DC

## 2016-11-08 MED ORDER — HYDRALAZINE HCL 100 MG PO TABS: 100.0000 mg | ORAL_TABLET | Freq: Two times a day (BID) | ORAL | 5 refills | Status: DC

## 2016-11-08 NOTE — Progress Notes (Signed)
Subjective:  Patient ID: Jesus BarmanGentry Morrison, male    DOB: 12-Jul-1926  Age: 83 y.o. MRN: [id number]  CC: Hypertension   HPI Jesus Morrison is an 83 year old male who presents for follow-up visit of his hypertension.  He brings in a letter from his pharmacy indicating his valsartan/HCTZ has been recalled. Endorses compliance with his antihypertensives however his blood pressure is slightly elevated. Compliant with low-sodium diet but doesn't exercise - he works part-time at [employer].  He has no other complaints today.  Past Medical History:  Diagnosis Date  . Arthritis    fingers and hands  . BPH (benign prostatic hypertrophy)    problems with acute urinary retention- has indwelling foley catheter  . Cold (disease)    last week ( week of Dec 22- Dec 29 ) - no longer coughing, no fever and feels better now  . Enlarged prostate   . Hypertension     Past Surgical History:  Procedure Laterality Date  .  Right elbow surgery as a child    . PROSTATECTOMY  04/17/2012   Procedure: PROSTATECTOMY RETROPUBIC;  Surgeon: Lindaann SloughMarc-Henry Nesi, MD;  Location: [facility];  Service: Urology;  Laterality: N/A;  Simple Retropubic Prostatectomy      No Known Allergies   Outpatient Medications Prior to Visit  Medication Sig Dispense Refill  . cetirizine (ZYRTEC) 10 MG tablet Take 1 tablet (10 mg total) by mouth daily. 30 tablet 1  . lactulose (CHRONULAC) 10 GM/15ML solution Take 15 mLs (10 g total) by mouth 2 (two) times daily as needed for mild constipation. 946 mL 1  . amLODipine (NORVASC) 10 MG tablet take 1 tablet by mouth once daily 30 tablet 2  . hydrALAZINE (APRESOLINE) 100 MG tablet Take 1 tablet (100 mg total) by mouth 2 (two) times daily. 60 tablet 3  . valsartan-hydrochlorothiazide (DIOVAN-HCT) 320-12.5 MG tablet take 1 tablet by mouth once daily 30 tablet 2   No facility-administered medications prior to visit.     ROS Review of Systems  Constitutional: Negative for activity change and  appetite change.  HENT: Negative for sinus pressure and sore throat.   Eyes: Negative for visual disturbance.  Respiratory: Negative for cough, chest tightness and shortness of breath.   Cardiovascular: Negative for chest pain and leg swelling.  Gastrointestinal: Negative for abdominal distention, abdominal pain, constipation and diarrhea.  Endocrine: Negative.   Genitourinary: Negative for dysuria.  Musculoskeletal: Negative for joint swelling and myalgias.  Skin: Negative for rash.  Allergic/Immunologic: Negative.   Neurological: Negative for weakness, light-headedness and numbness.  Psychiatric/Behavioral: Negative for dysphoric mood and suicidal ideas.    Objective:  BP (!) 148/39 (BP Location: Left Arm, Patient Position: Sitting, Cuff Size: Normal)   Pulse (!) 51   Temp 98 F (36.7 C) (Oral)   Resp 18   Ht 5\' 5"  (1.651 m)   Wt 175 lb (79.4 kg)   SpO2 100%   BMI 29.12 kg/m   BP/Weight 11/08/2016 07/26/2016 04/19/2016  Systolic BP 148 158 160  Diastolic BP 39 53 60  Wt. (Lbs) 175 179.6 181.2  BMI 29.12 29.89 30.15      Physical Exam  Constitutional: He is oriented to person, place, and time. He appears well-developed and well-nourished.  Cardiovascular: Normal rate, normal heart sounds and intact distal pulses.   No murmur heard. Pulmonary/Chest: Effort normal and breath sounds normal. He has no wheezes. He has no rales. He exhibits no tenderness.  Abdominal: Soft. Bowel sounds are normal. He exhibits no  distension and no mass. There is no tenderness.  Musculoskeletal: Normal range of motion.  Neurological: He is alert and oriented to person, place, and time.  Skin: Skin is warm and dry.  Psychiatric: He has a normal mood and affect.    CMP Latest Ref Rng & Units 07/26/2016 10/27/2015 08/04/2015  Glucose 65 - 99 mg/dL 86 94 161(W101(H)  BUN 8 - 27 mg/dL 17 25 14   Creatinine 0.76 - 1.27 mg/dL 9.600.84 4.540.90 0.980.83  Sodium 134 - 144 mmol/L 140 143 141  Potassium 3.5 - 5.2 mmol/L  4.5 4.1 3.7  Chloride 96 - 106 mmol/L 102 107 105  CO2 18 - 29 mmol/L 25 26 25   Calcium 8.6 - 10.2 mg/dL 9.3 8.7 8.9  Total Protein 6.0 - 8.5 g/dL 7.0 6.4 1.1(B6.4(L)  Total Bilirubin 0.0 - 1.2 mg/dL 0.7 0.8 0.9  Alkaline Phos 39 - 117 IU/L 53 36(L) 39  AST 0 - 40 IU/L 16 12 16   ALT 0 - 44 IU/L 10 9 12(L)    Lipid Panel     Component Value Date/Time   CHOL 144 10/27/2015 0943   TRIG 51 10/27/2015 0943   HDL 56 10/27/2015 0943   CHOLHDL 2.6 10/27/2015 0943   VLDL 10 10/27/2015 0943   LDLCALC 78 10/27/2015 0943     Assessment & Plan:   1. Essential hypertension Blood pressure slightly elevated above goal of less than 130/80 Discontinue losartan/HCTZ due to recall and substituted with lisinopril/HCTZ positive - amLODipine (NORVASC) 10 MG tablet; Take 1 tablet (10 mg total) by mouth daily.  Dispense: 30 tablet; Refill: 5 - hydrALAZINE (APRESOLINE) 100 MG tablet; Take 1 tablet (100 mg total) by mouth 2 (two) times daily.  Dispense: 60 tablet; Refill: 5 - Comprehensive metabolic panel; Future - Lipid panel; Future   Meds ordered this encounter  Medications  . lisinopril-hydrochlorothiazide (PRINZIDE,ZESTORETIC) 20-25 MG tablet    Sig: Take 1 tablet by mouth daily.    Dispense:  30 tablet    Refill:  5    Discontinue Losartan/HCTZ  . amLODipine (NORVASC) 10 MG tablet    Sig: Take 1 tablet (10 mg total) by mouth daily.    Dispense:  30 tablet    Refill:  5  . hydrALAZINE (APRESOLINE) 100 MG tablet    Sig: Take 1 tablet (100 mg total) by mouth 2 (two) times daily.    Dispense:  60 tablet    Refill:  5    Discontinue previous dose    Follow-up: Return in about 3 months (around 02/08/2017) for follow up on Hypertension.   This note has been created with Education officer, environmentalDragon speech recognition software and smart phrase technology. Any transcriptional errors are unintentional.     Jaclyn ShaggyEnobong Amao MD

## 2016-11-08 NOTE — Patient Instructions (Signed)

## 2016-11-12 ENCOUNTER — Ambulatory Visit: Payer: Commercial Managed Care - HMO | Attending: Family Medicine

## 2016-11-12 DIAGNOSIS — I10 Essential (primary) hypertension: Secondary | ICD-10-CM

## 2016-11-13 LAB — COMPREHENSIVE METABOLIC PANEL
ALBUMIN: 3.8 g/dL (ref 3.5–4.7)
ALT: 16 IU/L (ref 0–44)
AST: 12 IU/L (ref 0–40)
Albumin/Globulin Ratio: 1.5 (ref 1.2–2.2)
Alkaline Phosphatase: 53 IU/L (ref 39–117)
BUN / CREAT RATIO: 17 (ref 10–24)
BUN: 15 mg/dL (ref 8–27)
Bilirubin Total: 0.9 mg/dL (ref 0.0–1.2)
CALCIUM: 9 mg/dL (ref 8.6–10.2)
CO2: 25 mmol/L (ref 20–29)
CREATININE: 0.9 mg/dL (ref 0.76–1.27)
Chloride: 104 mmol/L (ref 96–106)
GFR calc Af Amer: 88 mL/min/{1.73_m2} (ref >59)
GFR, EST NON AFRICAN AMERICAN: 76 mL/min/{1.73_m2} (ref >59)
GLOBULIN, TOTAL: 2.6 g/dL (ref 1.5–4.5)
Glucose: 86 mg/dL (ref 65–99)
Potassium: 4.8 mmol/L (ref 3.5–5.2)
SODIUM: 143 mmol/L (ref 134–144)
Total Protein: 6.4 g/dL (ref 6.0–8.5)

## 2016-11-13 LAB — LIPID PANEL
Chol/HDL Ratio: 2.8 ratio (ref 0.0–5.0)
Cholesterol, Total: 141 mg/dL (ref 100–199)
HDL: 51 mg/dL (ref >39)
LDL CALC: 81 mg/dL (ref 0–99)
TRIGLYCERIDES: 44 mg/dL (ref 0–149)
VLDL Cholesterol Cal: 9 mg/dL (ref 5–40)

## 2016-11-28 ENCOUNTER — Telehealth: Payer: Self-pay | Admitting: *Deleted

## 2016-11-28 NOTE — Telephone Encounter (Signed)
Please inform patient.  Left several messages for patient to call back.   Notes recorded by Jaclyn ShaggyAmao, Enobong, MD on 11/13/2016 at 2:38 PM EDT Please inform the patient that labs are normal. Thank you.

## 2016-12-02 ENCOUNTER — Encounter: Payer: Self-pay | Admitting: *Deleted

## 2016-12-10 ENCOUNTER — Other Ambulatory Visit: Payer: Self-pay | Admitting: Family Medicine

## 2016-12-10 DIAGNOSIS — I10 Essential (primary) hypertension: Secondary | ICD-10-CM

## 2017-01-03 ENCOUNTER — Telehealth: Payer: Self-pay | Admitting: Family Medicine

## 2017-01-03 NOTE — Telephone Encounter (Signed)
Pt. Came to facility and was informed of Lab results being normal.

## 2017-02-20 ENCOUNTER — Encounter: Payer: Self-pay | Admitting: Family Medicine

## 2017-02-20 ENCOUNTER — Ambulatory Visit: Payer: 59 | Attending: Family Medicine | Admitting: Family Medicine

## 2017-02-20 DIAGNOSIS — R0982 Postnasal drip: Secondary | ICD-10-CM | POA: Insufficient documentation

## 2017-02-20 DIAGNOSIS — Z79899 Other long term (current) drug therapy: Secondary | ICD-10-CM | POA: Insufficient documentation

## 2017-02-20 DIAGNOSIS — R059 Cough, unspecified: Secondary | ICD-10-CM

## 2017-02-20 DIAGNOSIS — I10 Essential (primary) hypertension: Secondary | ICD-10-CM | POA: Diagnosis not present

## 2017-02-20 DIAGNOSIS — R05 Cough: Secondary | ICD-10-CM | POA: Diagnosis not present

## 2017-02-20 DIAGNOSIS — K5909 Other constipation: Secondary | ICD-10-CM | POA: Insufficient documentation

## 2017-02-20 MED ORDER — LACTULOSE 10 GM/15ML PO SOLN: 10.0000 g | Freq: Two times a day (BID) | ORAL | 1 refills | Status: DC | PRN

## 2017-02-20 MED ORDER — AMLODIPINE BESYLATE 10 MG PO TABS: 10.0000 mg | ORAL_TABLET | Freq: Every day | ORAL | 5 refills | Status: DC

## 2017-02-20 MED ORDER — LISINOPRIL-HYDROCHLOROTHIAZIDE 20-25 MG PO TABS: 1.0000 | ORAL_TABLET | Freq: Every day | ORAL | 5 refills | Status: DC

## 2017-02-20 MED ORDER — CETIRIZINE HCL 10 MG PO TABS: 10.0000 mg | ORAL_TABLET | Freq: Every day | ORAL | 1 refills | Status: DC

## 2017-02-20 MED ORDER — HYDRALAZINE HCL 100 MG PO TABS: 100.0000 mg | ORAL_TABLET | Freq: Two times a day (BID) | ORAL | 5 refills | Status: DC

## 2017-02-20 NOTE — Patient Instructions (Signed)
Constipation, Adult °Constipation is when a person: °· Poops (has a bowel movement) fewer times in a week than normal. °· Has a hard time pooping. °· Has poop that is dry, hard, or bigger than normal. ° °Follow these instructions at home: °Eating and drinking ° °· Eat foods that have a lot of fiber, such as: °? Fresh fruits and vegetables. °? Whole grains. °? Beans. °· Eat less of foods that are high in fat, low in fiber, or overly processed, such as: °? French fries. °? Hamburgers. °? Cookies. °? Candy. °? Soda. °· Drink enough fluid to keep your pee (urine) clear or pale yellow. °General instructions °· Exercise regularly or as told by your doctor. °· Go to the restroom when you feel like you need to poop. Do not hold it in. °· Take over-the-counter and prescription medicines only as told by your doctor. These include any fiber supplements. °· Do pelvic floor retraining exercises, such as: °? Doing deep breathing while relaxing your lower belly (abdomen). °? Relaxing your pelvic floor while pooping. °· Watch your condition for any changes. °· Keep all follow-up visits as told by your doctor. This is important. °Contact a doctor if: °· You have pain that gets worse. °· You have a fever. °· You have not pooped for 4 days. °· You throw up (vomit). °· You are not hungry. °· You lose weight. °· You are bleeding from the anus. °· You have thin, pencil-like poop (stool). °Get help right away if: °· You have a fever, and your symptoms suddenly get worse. °· You leak poop or have blood in your poop. °· Your belly feels hard or bigger than normal (is bloated). °· You have very bad belly pain. °· You feel dizzy or you faint. °This information is not intended to replace advice given to you by your health care provider. Make sure you discuss any questions you have with your health care provider. °Document Released: 09/18/2007 Document Revised: 10/20/2015 Document Reviewed: 09/20/2015 °Elsevier Interactive Patient Education ©  2017 [employer] Inc[employer] ° °

## 2017-02-20 NOTE — Progress Notes (Signed)
Subjective:  Patient ID: Jesus Morrison, male    DOB: [birth year]  Age: 83 y.o. MRN: [id number]  CC: Hypertension   HPI Jesus BarmanGentry Zanella is an 83 year old male with a history of hypertension who presents today for follow-up visit and endorses compliance with his antihypertensives and denies any side effects.  Complains of constipation with his last bowel movement yesterday but had to strain.  Denies hematochezia, abdominal pain. His diet consists of a lot of white flour and cheese and he has very little fiber intake.  He remains on Zyrtec which he takes for chronic postnasal drip.  Past Medical History:  Diagnosis Date  . Arthritis    fingers and hands  . BPH (benign prostatic hypertrophy)    problems with acute urinary retention- has indwelling foley catheter  . Cold (disease)    last week ( week of Dec 22- Dec 29 ) - no longer coughing, no fever and feels better now  . Enlarged prostate   . Hypertension     Past Surgical History:  Procedure Laterality Date  .  Right elbow surgery as a child      No Known Allergies   Outpatient Medications Prior to Visit  Medication Sig Dispense Refill  . amLODipine (NORVASC) 10 MG tablet Take 1 tablet (10 mg total) by mouth daily. 30 tablet 5  . hydrALAZINE (APRESOLINE) 100 MG tablet Take 1 tablet (100 mg total) by mouth 2 (two) times daily. 60 tablet 5  . lisinopril-hydrochlorothiazide (PRINZIDE,ZESTORETIC) 20-25 MG tablet Take 1 tablet by mouth daily. 30 tablet 5  . cetirizine (ZYRTEC) 10 MG tablet Take 1 tablet (10 mg total) by mouth daily. (Patient not taking: Reported on 02/20/2017) 30 tablet 1  . lactulose (CHRONULAC) 10 GM/15ML solution Take 15 mLs (10 g total) by mouth 2 (two) times daily as needed for mild constipation. (Patient not taking: Reported on 02/20/2017) 946 mL 1   No facility-administered medications prior to visit.     ROS Review of Systems  Constitutional: Negative for activity change and appetite change.  HENT: Negative  for sinus pressure and sore throat.   Eyes: Negative for visual disturbance.  Respiratory: Negative for cough, chest tightness and shortness of breath.   Cardiovascular: Negative for chest pain and leg swelling.  Gastrointestinal: Positive for constipation. Negative for abdominal distention, abdominal pain and diarrhea.  Endocrine: Negative.   Genitourinary: Negative for dysuria.  Musculoskeletal: Negative for joint swelling and myalgias.  Skin: Negative for rash.  Allergic/Immunologic: Negative.   Neurological: Negative for weakness, light-headedness and numbness.  Psychiatric/Behavioral: Negative for dysphoric mood and suicidal ideas.    Objective:  BP (!) 137/45   Pulse 63   Temp 97.7 F (36.5 C) (Oral)   Ht 5\' 5"  (1.651 m)   Wt 177 lb 6.4 oz (80.5 kg)   SpO2 99%   BMI 29.52 kg/m   BP/Weight 02/20/2017 11/08/2016 07/26/2016  Systolic BP 137 148 158  Diastolic BP 45 39 53  Wt. (Lbs) 177.4 175 179.6  BMI 29.52 29.12 29.89      Physical Exam  Constitutional: He is oriented to person, place, and time. He appears well-developed and well-nourished.  Cardiovascular: Normal rate, normal heart sounds and intact distal pulses.  No murmur heard. Pulmonary/Chest: Effort normal and breath sounds normal. He has no wheezes. He has no rales. He exhibits no tenderness.  Abdominal: Soft. Bowel sounds are normal. He exhibits no distension and no mass. There is no tenderness.  Musculoskeletal: Normal range of motion.  Neurological: He is alert and oriented to person, place, and time.  Psychiatric: He has a normal mood and affect.     CMP Latest Ref Rng & Units 11/12/2016 07/26/2016 10/27/2015  Glucose 65 - 99 mg/dL 86 86 94  BUN 8 - 27 mg/dL 15 17 25   Creatinine 0.76 - 1.27 mg/dL 8.460.90 9.620.84 9.520.90  Sodium 134 - 144 mmol/L 143 140 143  Potassium 3.5 - 5.2 mmol/L 4.8 4.5 4.1  Chloride 96 - 106 mmol/L 104 102 107  CO2 20 - 29 mmol/L 25 25 26   Calcium 8.6 - 10.2 mg/dL 9.0 9.3 8.7  Total  Protein 6.0 - 8.5 g/dL 6.4 7.0 6.4  Total Bilirubin 0.0 - 1.2 mg/dL 0.9 0.7 0.8  Alkaline Phos 39 - 117 IU/L 53 53 36(L)  AST 0 - 40 IU/L 12 16 12   ALT 0 - 44 IU/L 16 10 9     Lipid Panel     Component Value Date/Time   CHOL 141 11/12/2016 0831   TRIG 44 11/12/2016 0831   HDL 51 11/12/2016 0831   CHOLHDL 2.8 11/12/2016 0831   CHOLHDL 2.6 10/27/2015 0943   VLDL 10 10/27/2015 0943   LDLCALC 81 11/12/2016 0831    Assessment & Plan:   1. Essential hypertension Controlled Low sodium, DASH diet - lisinopril-hydrochlorothiazide (PRINZIDE,ZESTORETIC) 20-25 MG tablet; Take 1 tablet daily by mouth.  Dispense: 30 tablet; Refill: 5 - amLODipine (NORVASC) 10 MG tablet; Take 1 tablet (10 mg total) daily by mouth.  Dispense: 30 tablet; Refill: 5 - hydrALAZINE (APRESOLINE) 100 MG tablet; Take 1 tablet (100 mg total) 2 (two) times daily by mouth.  Dispense: 60 tablet; Refill: 5 - Basic Metabolic Panel  2. Other constipation Advised to increase fiber intake Refill lactulose Could also be secondary to constipating adverse effect of amlodipine - lactulose (CHRONULAC) 10 GM/15ML solution; Take 15 mLs (10 g total) 2 (two) times daily as needed by mouth for mild constipation.  Dispense: 946 mL; Refill: 1  3. Cough Controlled - cetirizine (ZYRTEC) 10 MG tablet; Take 1 tablet (10 mg total) daily by mouth.  Dispense: 30 tablet; Refill: 1   Meds ordered this encounter  Medications  . lisinopril-hydrochlorothiazide (PRINZIDE,ZESTORETIC) 20-25 MG tablet    Sig: Take 1 tablet daily by mouth.    Dispense:  30 tablet    Refill:  5    Discontinue Losartan/HCTZ  . lactulose (CHRONULAC) 10 GM/15ML solution    Sig: Take 15 mLs (10 g total) 2 (two) times daily as needed by mouth for mild constipation.    Dispense:  946 mL    Refill:  1  . cetirizine (ZYRTEC) 10 MG tablet    Sig: Take 1 tablet (10 mg total) daily by mouth.    Dispense:  30 tablet    Refill:  1  . amLODipine (NORVASC) 10 MG tablet     Sig: Take 1 tablet (10 mg total) daily by mouth.    Dispense:  30 tablet    Refill:  5  . hydrALAZINE (APRESOLINE) 100 MG tablet    Sig: Take 1 tablet (100 mg total) 2 (two) times daily by mouth.    Dispense:  60 tablet    Refill:  5    Discontinue previous dose    Follow-up: Return in about 3 months (around 05/23/2017) for follow up on hypertension and constipation.   Jaclyn ShaggyEnobong Amao MD

## 2017-02-21 ENCOUNTER — Telehealth: Payer: Self-pay

## 2017-02-21 LAB — BASIC METABOLIC PANEL
BUN/Creatinine Ratio: 26 — ABNORMAL HIGH (ref 10–24)
BUN: 24 mg/dL (ref 8–27)
CO2: 26 mmol/L (ref 20–29)
CREATININE: 0.92 mg/dL (ref 0.76–1.27)
Calcium: 9.2 mg/dL (ref 8.6–10.2)
Chloride: 102 mmol/L (ref 96–106)
GFR calc Af Amer: 86 mL/min/{1.73_m2} (ref >59)
GFR calc non Af Amer: 74 mL/min/{1.73_m2} (ref >59)
GLUCOSE: 95 mg/dL (ref 65–99)
Potassium: 4.3 mmol/L (ref 3.5–5.2)
Sodium: 141 mmol/L (ref 134–144)

## 2017-02-21 NOTE — Telephone Encounter (Signed)
Pt was called and a VM was left informing pt to return phone call for lab results.   If patient returns phone call please inform pt that his lab are stable.

## 2017-04-25 ENCOUNTER — Ambulatory Visit: Payer: Medicare Other | Attending: Family Medicine | Admitting: Family Medicine

## 2017-04-25 ENCOUNTER — Encounter: Payer: Self-pay | Admitting: Family Medicine

## 2017-04-25 VITALS — BP 190/56 | HR 58 | Temp 97.7°F | Ht 65.0 in | Wt 181.0 lb

## 2017-04-25 DIAGNOSIS — I10 Essential (primary) hypertension: Secondary | ICD-10-CM | POA: Diagnosis not present

## 2017-04-25 DIAGNOSIS — X58XXXA Exposure to other specified factors, initial encounter: Secondary | ICD-10-CM | POA: Insufficient documentation

## 2017-04-25 DIAGNOSIS — T7840XA Allergy, unspecified, initial encounter: Secondary | ICD-10-CM | POA: Diagnosis not present

## 2017-04-25 MED ORDER — ISOSORBIDE MONONITRATE ER 60 MG PO TB24: 60.0000 mg | ORAL_TABLET | Freq: Every day | ORAL | 3 refills | Status: DC

## 2017-04-25 MED ORDER — HYDROCORTISONE 0.5 % EX CREA: 1.0000 "application " | TOPICAL_CREAM | Freq: Two times a day (BID) | CUTANEOUS | 1 refills | Status: DC

## 2017-04-25 NOTE — Progress Notes (Signed)
Subjective:  Patient ID: Jesus Morrison, male    DOB: 07-06-1926  Age: 84 y.o. MRN: [id number]  CC: Medication Reaction   HPI Jesus Morrison is an 84 year old male with a history of hypertension who presents today complaining of developing a rash with hydralazine. He complains of noticing a pruritic rash on his legs and left side of trunk last week and had to use rubbing alcohol to relieve symptoms.  He denies history of allergies to foods or creams and attributes his symptoms to hydralazine. Denies shortness of breath or pedal edema.  His blood pressure is severely elevated and he is yet to take any of his antihypertensives today.  Past Medical History:  Diagnosis Date  . Arthritis    fingers and hands  . BPH (benign prostatic hypertrophy)    problems with acute urinary retention- has indwelling foley catheter  . Cold (disease)    last week ( week of Dec 22- Dec 29 ) - no longer coughing, no fever and feels better now  . Enlarged prostate   . Hypertension     Past Surgical History:  Procedure Laterality Date  .  Right elbow surgery as a child    . PROSTATECTOMY  04/17/2012   Procedure: PROSTATECTOMY RETROPUBIC;  Surgeon: Lindaann SloughMarc-Henry Nesi, MD;  Location: [facility];  Service: Urology;  Laterality: N/A;  Simple Retropubic Prostatectomy      No Known Allergies   Outpatient Medications Prior to Visit  Medication Sig Dispense Refill  . amLODipine (NORVASC) 10 MG tablet Take 1 tablet (10 mg total) daily by mouth. 30 tablet 5  . cetirizine (ZYRTEC) 10 MG tablet Take 1 tablet (10 mg total) daily by mouth. 30 tablet 1  . lactulose (CHRONULAC) 10 GM/15ML solution Take 15 mLs (10 g total) 2 (two) times daily as needed by mouth for mild constipation. 946 mL 1  . lisinopril-hydrochlorothiazide (PRINZIDE,ZESTORETIC) 20-25 MG tablet Take 1 tablet daily by mouth. 30 tablet 5  . hydrALAZINE (APRESOLINE) 100 MG tablet Take 1 tablet (100 mg total) 2 (two) times daily by mouth. 60 tablet 5   No  facility-administered medications prior to visit.     ROS Review of Systems  Constitutional: Negative for activity change and appetite change.  HENT: Negative for sinus pressure and sore throat.   Eyes: Negative for visual disturbance.  Respiratory: Negative for cough, chest tightness and shortness of breath.   Cardiovascular: Negative for chest pain and leg swelling.  Gastrointestinal: Negative for abdominal distention, abdominal pain, constipation and diarrhea.  Endocrine: Negative.   Genitourinary: Negative for dysuria.  Musculoskeletal: Negative for joint swelling and myalgias.  Skin: Positive for rash.  Allergic/Immunologic: Negative.   Neurological: Negative for weakness, light-headedness and numbness.  Psychiatric/Behavioral: Negative for dysphoric mood and suicidal ideas.    Objective:  BP (!) 190/56   Pulse (!) 58   Temp 97.7 F (36.5 C) (Oral)   Ht 5\' 5"  (1.651 m)   Wt 181 lb (82.1 kg)   SpO2 99%   BMI 30.12 kg/m   BP/Weight 04/25/2017 02/20/2017 11/08/2016  Systolic BP 190 137 148  Diastolic BP 56 45 39  Wt. (Lbs) 181 177.4 175  BMI 30.12 29.52 29.12      Physical Exam  Constitutional: He is oriented to person, place, and time. He appears well-developed and well-nourished.  Cardiovascular: Normal rate, normal heart sounds and intact distal pulses.  No murmur heard. Pulmonary/Chest: Effort normal and breath sounds normal. He has no wheezes. He has no rales. He  exhibits no tenderness.  Abdominal: Soft. Bowel sounds are normal. He exhibits no distension and no mass. There is no tenderness.  Musculoskeletal: Normal range of motion.  Neurological: He is alert and oriented to person, place, and time.  Skin:  Hyperpigmented papular rash on posterior aspect of both legs     Assessment & Plan:   1. Essential hypertension Uncontrolled due to not taking medications today Unable to administer clonidine in the clinic due to tachycardia Discontinue hydralazine as  patient complains of developing a rash with it Commence isosorbide and will reassess blood pressure at next office visit Counseled on 150 minutes of exercise every week, healthy eating, routine healthcare maintenance. - isosorbide mononitrate (IMDUR) 60 MG 24 hr tablet; Take 1 tablet (60 mg total) by mouth daily.  Dispense: 30 tablet; Refill: 3  2. Allergic reaction to drug, initial encounter - hydrocortisone cream 0.5 %; Apply 1 application topically 2 (two) times daily.  Dispense: 30 g; Refill: 1   Meds ordered this encounter  Medications  . isosorbide mononitrate (IMDUR) 60 MG 24 hr tablet    Sig: Take 1 tablet (60 mg total) by mouth daily.    Dispense:  30 tablet    Refill:  3    Discontinue hydralazine  . hydrocortisone cream 0.5 %    Sig: Apply 1 application topically 2 (two) times daily.    Dispense:  30 g    Refill:  1    Follow-up: Return for Follow-up of hypertension, keep previously scheduled appointment.   Jaclyn Shaggy MD

## 2017-04-25 NOTE — Patient Instructions (Signed)

## 2017-05-01 ENCOUNTER — Ambulatory Visit: Payer: Medicare Other | Attending: Family Medicine | Admitting: Family Medicine

## 2017-05-01 ENCOUNTER — Telehealth: Payer: Self-pay | Admitting: Family Medicine

## 2017-05-01 ENCOUNTER — Encounter: Payer: Self-pay | Admitting: Family Medicine

## 2017-05-01 VITALS — BP 167/65 | HR 66 | Temp 97.6°F | Ht 65.0 in | Wt 182.6 lb

## 2017-05-01 DIAGNOSIS — L299 Pruritus, unspecified: Secondary | ICD-10-CM | POA: Diagnosis not present

## 2017-05-01 DIAGNOSIS — Z79899 Other long term (current) drug therapy: Secondary | ICD-10-CM | POA: Diagnosis not present

## 2017-05-01 DIAGNOSIS — I10 Essential (primary) hypertension: Secondary | ICD-10-CM

## 2017-05-01 DIAGNOSIS — Z9889 Other specified postprocedural states: Secondary | ICD-10-CM | POA: Insufficient documentation

## 2017-05-01 DIAGNOSIS — K5909 Other constipation: Secondary | ICD-10-CM

## 2017-05-01 DIAGNOSIS — K5900 Constipation, unspecified: Secondary | ICD-10-CM | POA: Diagnosis not present

## 2017-05-01 DIAGNOSIS — R21 Rash and other nonspecific skin eruption: Secondary | ICD-10-CM | POA: Diagnosis present

## 2017-05-01 MED ORDER — LACTULOSE 10 GM/15ML PO SOLN: 10.0000 g | Freq: Two times a day (BID) | ORAL | 1 refills | Status: DC | PRN

## 2017-05-01 NOTE — Patient Instructions (Signed)

## 2017-05-01 NOTE — Progress Notes (Signed)
Pt states that he is still having itching.

## 2017-05-01 NOTE — Telephone Encounter (Signed)
Pt came in office today to discuss concerns about medication. He states he feels better but still itching. He has plans to attend a funeral on Saturday and request an injection today.  He has not tried to take OTC medication, benadryl or cetirizine. Pt scheduled with Dr. Venetia NightAmao.

## 2017-05-01 NOTE — Progress Notes (Signed)
Subjective:  Patient ID: Jesus Morrison Street, male    DOB: 08/12/1926  Age: 84 y.o. MRN: [id number]  CC: Medication Reaction   HPI Jesus Morrison Carmicheal is an 84 year old male with a history of hypertension who was seen 1 week ago for a pruritic rash which he attributed to hydralazine. Hydralazine was discontinued and substituted with isosorbide and he was written a prescription for hydrocortisone cream which he never picked up.  He presents today informing me the rash has improved but he does have residual pruritus in his elbows.  He also informs me he needs to "clear his system" and received a penicillin shot in the past from his previous PCP which helped with his symptoms. He endorses being constipated.  His blood pressure is elevated and he is yet to take his morning dose of antihypertensives.  Past Medical History:  Diagnosis Date  . Arthritis    fingers and hands  . BPH (benign prostatic hypertrophy)    problems with acute urinary retention- has indwelling foley catheter  . Cold (disease)    last week ( week of Dec 22- Dec 29 ) - no longer coughing, no fever and feels better now  . Enlarged prostate   . Hypertension     Past Surgical History:  Procedure Laterality Date  .  Right elbow surgery as a child    . PROSTATECTOMY  04/17/2012   Procedure: PROSTATECTOMY RETROPUBIC;  Surgeon: Lindaann SloughMarc-Henry Nesi, MD;  Location: [facility];  Service: Urology;  Laterality: N/A;  Simple Retropubic Prostatectomy      No Known Allergies   Outpatient Medications Prior to Visit  Medication Sig Dispense Refill  . amLODipine (NORVASC) 10 MG tablet Take 1 tablet (10 mg total) daily by mouth. 30 tablet 5  . cetirizine (ZYRTEC) 10 MG tablet Take 1 tablet (10 mg total) daily by mouth. 30 tablet 1  . hydrocortisone cream 0.5 % Apply 1 application topically 2 (two) times daily. 30 g 1  . isosorbide mononitrate (IMDUR) 60 MG 24 hr tablet Take 1 tablet (60 mg total) by mouth daily. 30 tablet 3  .  lisinopril-hydrochlorothiazide (PRINZIDE,ZESTORETIC) 20-25 MG tablet Take 1 tablet daily by mouth. 30 tablet 5  . lactulose (CHRONULAC) 10 GM/15ML solution Take 15 mLs (10 g total) 2 (two) times daily as needed by mouth for mild constipation. 946 mL 1   No facility-administered medications prior to visit.     ROS Review of Systems  Constitutional: Negative for activity change and appetite change.  HENT: Negative for sinus pressure and sore throat.   Eyes: Negative for visual disturbance.  Respiratory: Negative for cough, chest tightness and shortness of breath.   Cardiovascular: Negative for chest pain and leg swelling.  Gastrointestinal: Positive for constipation. Negative for abdominal distention, abdominal pain and diarrhea.  Endocrine: Negative.   Genitourinary: Negative for dysuria.  Musculoskeletal: Negative for joint swelling and myalgias.  Skin:       See hpi  Allergic/Immunologic: Negative.   Neurological: Negative for weakness, light-headedness and numbness.  Psychiatric/Behavioral: Negative for dysphoric mood and suicidal ideas.    Objective:  BP (!) 167/65   Pulse 66   Temp 97.6 F (36.4 C) (Oral)   Ht 5\' 5"  (1.651 m)   Wt 182 lb 9.6 oz (82.8 kg)   SpO2 99%   BMI 30.39 kg/m   BP/Weight 05/01/2017 04/25/2017 02/20/2017  Systolic BP 167 190 137  Diastolic BP 65 56 45  Wt. (Lbs) 182.6 181 177.4  BMI 30.39 30.12 29.52  Physical Exam  Constitutional: He is oriented to person, place, and time. He appears well-developed and well-nourished.  Cardiovascular: Normal rate, normal heart sounds and intact distal pulses.  No murmur heard. Pulmonary/Chest: Effort normal and breath sounds normal. He has no wheezes. He has no rales. He exhibits no tenderness.  Abdominal: Soft. Bowel sounds are normal. He exhibits no distension and no mass. There is no tenderness.  Musculoskeletal: Normal range of motion.  Neurological: He is alert and oriented to person, place, and  time.  Skin: Skin is warm and dry.  Psychiatric: He has a normal mood and affect.     Assessment & Plan:   1. Pruritus Improving We have spoke with the pharmacy and sidehole assist with providing him with hydrocortisone cream  2. Other constipation Advised to increase fiber intake - lactulose (CHRONULAC) 10 GM/15ML solution; Take 15 mLs (10 g total) by mouth 2 (two) times daily as needed for mild constipation.  Dispense: 946 mL; Refill: 1  3. Essential hypertension Uncontrolled due to not taking antihypertensives this morning Emphasized compliance Counseled on blood pressure goal of less than 130/80, low-sodium, DASH diet, medication compliance, 150 minutes of moderate intensity exercise per week. Discussed medication compliance, adverse effects.    Meds ordered this encounter  Medications  . lactulose (CHRONULAC) 10 GM/15ML solution    Sig: Take 15 mLs (10 g total) by mouth 2 (two) times daily as needed for mild constipation.    Dispense:  946 mL    Refill:  1    Follow-up: Return for Follow-up of hypertension, keep previously scheduled appointment.   Jaclyn Shaggy MD

## 2017-05-16 ENCOUNTER — Telehealth: Payer: Self-pay | Admitting: Family Medicine

## 2017-05-16 DIAGNOSIS — T7840XA Allergy, unspecified, initial encounter: Secondary | ICD-10-CM

## 2017-05-16 MED ORDER — HYDROCORTISONE 0.5 % EX CREA: 1.0000 "application " | TOPICAL_CREAM | Freq: Two times a day (BID) | CUTANEOUS | 1 refills | Status: AC

## 2017-05-16 NOTE — Telephone Encounter (Signed)
Done

## 2017-05-16 NOTE — Telephone Encounter (Signed)
Pt came to the office to request a refill for hydrocortisone cream 0.5 % Please sent it [id number] [facility] [place],  - [address] Please follow up

## 2017-05-19 ENCOUNTER — Ambulatory Visit: Payer: Medicare Other | Attending: Family Medicine | Admitting: Family Medicine

## 2017-05-19 ENCOUNTER — Encounter: Payer: Self-pay | Admitting: Family Medicine

## 2017-05-19 VITALS — BP 142/62 | HR 55 | Temp 97.6°F | Ht 65.0 in | Wt 175.6 lb

## 2017-05-19 DIAGNOSIS — Z9889 Other specified postprocedural states: Secondary | ICD-10-CM | POA: Insufficient documentation

## 2017-05-19 DIAGNOSIS — N401 Enlarged prostate with lower urinary tract symptoms: Secondary | ICD-10-CM | POA: Diagnosis not present

## 2017-05-19 DIAGNOSIS — I10 Essential (primary) hypertension: Secondary | ICD-10-CM | POA: Diagnosis not present

## 2017-05-19 DIAGNOSIS — R300 Dysuria: Secondary | ICD-10-CM | POA: Diagnosis present

## 2017-05-19 DIAGNOSIS — Z79899 Other long term (current) drug therapy: Secondary | ICD-10-CM | POA: Diagnosis not present

## 2017-05-19 DIAGNOSIS — R21 Rash and other nonspecific skin eruption: Secondary | ICD-10-CM | POA: Diagnosis present

## 2017-05-19 DIAGNOSIS — N400 Enlarged prostate without lower urinary tract symptoms: Secondary | ICD-10-CM | POA: Insufficient documentation

## 2017-05-19 DIAGNOSIS — R3911 Hesitancy of micturition: Secondary | ICD-10-CM | POA: Diagnosis not present

## 2017-05-19 LAB — POCT URINALYSIS DIPSTICK
BILIRUBIN UA: NEGATIVE
GLUCOSE UA: NEGATIVE
KETONES UA: NEGATIVE
Leukocytes, UA: NEGATIVE
Nitrite, UA: NEGATIVE
Protein, UA: 30
RBC UA: NEGATIVE
SPEC GRAV UA: 1.02 (ref 1.010–1.025)
Urobilinogen, UA: 0.2 E.U./dL
pH, UA: 6.5 (ref 5.0–8.0)

## 2017-05-19 MED ORDER — TAMSULOSIN HCL 0.4 MG PO CAPS: 0.4000 mg | ORAL_CAPSULE | Freq: Every day | ORAL | 3 refills | Status: DC

## 2017-05-19 NOTE — Patient Instructions (Signed)
Benign Prostatic Hyperplasia  Benign prostatic hyperplasia (BPH) is an enlarged prostate gland that is caused by the normal aging process and not by cancer. The prostate is a walnut-sized gland that is involved in the production of semen. It is located in front of the rectum and below the bladder. The bladder stores urine and the urethra is the tube that carries the urine out of the body. The prostate may get bigger as a man gets older.  An enlarged prostate can press on the urethra. This can make it harder to pass urine. The build-up of urine in the bladder can cause infection. Back pressure and infection may progress to bladder damage and kidney (renal) failure.  What are the causes?  This condition is part of a normal aging process. However, not all men develop problems from this condition. If the prostate enlarges away from the urethra, urine flow will not be blocked. If it enlarges toward the urethra and compresses it, there will be problems passing urine.  What increases the risk?  This condition is more likely to develop in men over the age of 50 years.  What are the signs or symptoms?  Symptoms of this condition include:  · Getting up often during the night to urinate.  · Needing to urinate frequently during the day.  · Difficulty starting urine flow.  · Decrease in size and strength of your urine stream.  · Leaking (dribbling) after urinating.  · Inability to pass urine. This needs immediate treatment.  · Inability to completely empty your bladder.  · Pain when you pass urine. This is more common if there is also an infection.  · Urinary tract infection (UTI).    How is this diagnosed?  This condition is diagnosed based on your medical history, a physical exam, and your symptoms. Tests will also be done, such as:  · A post-void bladder scan. This measures any amount of urine that may remain in your bladder after you finish urinating.  · A digital rectal exam. In a rectal exam, your health care provider  checks your prostate by putting a lubricated, gloved finger into your rectum to feel the back of your prostate gland. This exam detects the size of your gland and any abnormal lumps or growths.  · An exam of your urine (urinalysis).  · A prostate specific antigen (PSA) screening. This is a blood test used to screen for prostate cancer.  · An ultrasound. This test uses sound waves to electronically produce a picture of your prostate gland.    Your health care provider may refer you to a specialist in kidney and prostate diseases (urologist).  How is this treated?  Once symptoms begin, your health care provider will monitor your condition (active surveillance or watchful waiting). Treatment for this condition will depend on the severity of your condition. Treatment may include:  · Observation and yearly exams. This may be the only treatment needed if your condition and symptoms are mild.  · Medicines to relieve your symptoms, including:  ? Medicines to shrink the prostate.  ? Medicines to relax the muscle of the prostate.  · Surgery in severe cases. Surgery may include:  ? Prostatectomy. In this procedure, the prostate tissue is removed completely through an open incision or with a laparascope or robotics.  ? Transurethral resection of the prostate (TURP). In this procedure, a tool is inserted through the opening at the tip of the penis (urethra). It is used to cut away tissue of   the inner core of the prostate. The pieces are removed through the same opening of the penis. This removes the blockage.  ? Transurethral incision (TUIP). In this procedure, small cuts are made in the prostate. This lessens the prostate's pressure on the urethra.  ? Transurethral microwave thermotherapy (TUMT). This procedure uses microwaves to create heat. The heat destroys and removes a small amount of prostate tissue.  ? Transurethral needle ablation (TUNA). This procedure uses radio frequencies to destroy and remove a small amount of  prostate tissue.  ? Interstitial laser coagulation (ILC). This procedure uses a laser to destroy and remove a small amount of prostate tissue.  ? Transurethral electrovaporization (TUVP). This procedure uses electrodes to destroy and remove a small amount of prostate tissue.  ? Prostatic urethral lift. This procedure inserts an implant to push the lobes of the prostate away from the urethra.    Follow these instructions at home:  · Take over-the-counter and prescription medicines only as told by your health care provider.  · Monitor your symptoms for any changes. Contact your health care provider with any changes.  · Avoid drinking large amounts of liquid before going to bed or out in public.  · Avoid or reduce how much caffeine or alcohol you drink.  · Give yourself time when you urinate.  · Keep all follow-up visits as told by your health care provider. This is important.  Contact a health care provider if:  · You have unexplained back pain.  · Your symptoms do not get better with treatment.  · You develop side effects from the medicine you are taking.  · Your urine becomes very dark or has a bad smell.  · Your lower abdomen becomes distended and you have trouble passing your urine.  Get help right away if:  · You have a fever or chills.  · You suddenly cannot urinate.  · You feel lightheaded, or very dizzy, or you faint.  · There are large amounts of blood or clots in the urine.  · Your urinary problems become hard to manage.  · You develop moderate to severe low back or flank pain. The flank is the side of your body between the ribs and the hip.  These symptoms may represent a serious problem that is an emergency. Do not wait to see if the symptoms will go away. Get medical help right away. Call your local emergency services (911 in the [place] Do not drive yourself to the hospital.  Summary  · Benign prostatic hyperplasia (BPH) is an enlarged prostate that is caused by the normal aging process and not by  cancer.  · An enlarged prostate can press on the urethra. This can make it hard to pass urine.  · This condition is part of a normal aging process and is more likely to develop in men over the age of 50 years.  · Get help right away if you suddenly cannot urinate.  This information is not intended to replace advice given to you by your health care provider. Make sure you discuss any questions you have with your health care provider.  Document Released: 04/01/2005 Document Revised: 05/06/2016 Document Reviewed: 05/06/2016  Elsevier Interactive Patient Education © 2018 [employer]

## 2017-05-19 NOTE — Progress Notes (Signed)
Subjective:  Patient ID: Jesus Morrison Summerson, male    DOB: [birth year]  Age: 84 y.o. MRN: [id number]  CC: Rash and Dysuria   HPI Jesus Morrison Yamin is an 84 year old male with a history of hypertension who presents today stating "I have problems with my kidneys". He describes this as pain on urination, urinary hesitancy and frequency but no fever. He does have a history of benign prostatic hyperplasia status post retropubic prostatectomy in 04/2012 and informs me he was on medication to help with urinary flow in the past. Denies flank pain, fever.  He was treated for a pruritic rash which was secondary to your drug reaction and he reports improvement with Hydrocortisone.  Past Medical History:  Diagnosis Date  . Arthritis    fingers and hands  . BPH (benign prostatic hypertrophy)    problems with acute urinary retention- has indwelling foley catheter  . Cold (disease)    last week ( week of Dec 22- Dec 29 ) - no longer coughing, no fever and feels better now  . Enlarged prostate   . Hypertension     Past Surgical History:  Procedure Laterality Date  .  Right elbow surgery as a child    . PROSTATECTOMY  04/17/2012   Procedure: PROSTATECTOMY RETROPUBIC;  Surgeon: Lindaann SloughMarc-Henry Nesi, MD;  Location: [facility];  Service: Urology;  Laterality: N/A;  Simple Retropubic Prostatectomy       Outpatient Medications Prior to Visit  Medication Sig Dispense Refill  . amLODipine (NORVASC) 10 MG tablet Take 1 tablet (10 mg total) daily by mouth. 30 tablet 5  . cetirizine (ZYRTEC) 10 MG tablet Take 1 tablet (10 mg total) daily by mouth. 30 tablet 1  . hydrocortisone cream 0.5 % Apply 1 application topically 2 (two) times daily. 30 g 1  . isosorbide mononitrate (IMDUR) 60 MG 24 hr tablet Take 1 tablet (60 mg total) by mouth daily. 30 tablet 3  . lactulose (CHRONULAC) 10 GM/15ML solution Take 15 mLs (10 g total) by mouth 2 (two) times daily as needed for mild constipation. 946 mL 1  . lisinopril-hydrochlorothiazide  (PRINZIDE,ZESTORETIC) 20-25 MG tablet Take 1 tablet daily by mouth. 30 tablet 5   No facility-administered medications prior to visit.     ROS Review of Systems  Constitutional: Negative for activity change and appetite change.  HENT: Negative for sinus pressure and sore throat.   Eyes: Negative for visual disturbance.  Respiratory: Negative for cough, chest tightness and shortness of breath.   Cardiovascular: Negative for chest pain and leg swelling.  Gastrointestinal: Negative for abdominal distention, abdominal pain, constipation and diarrhea.  Endocrine: Negative.   Genitourinary: Negative for dysuria.       See hpi  Musculoskeletal: Negative for joint swelling and myalgias.  Skin: Negative for rash.  Allergic/Immunologic: Negative.   Neurological: Negative for weakness, light-headedness and numbness.  Psychiatric/Behavioral: Negative for dysphoric mood and suicidal ideas.    Objective:  BP (!) 142/62   Pulse (!) 55   Temp 97.6 F (36.4 C) (Oral)   Ht 5\' 5"  (1.651 m)   Wt 175 lb 9.6 oz (79.7 kg)   SpO2 98%   BMI 29.22 kg/m   BP/Weight 05/19/2017 05/01/2017 04/25/2017  Systolic BP 142 167 190  Diastolic BP 62 65 56  Wt. (Lbs) 175.6 182.6 181  BMI 29.22 30.39 30.12      Physical Exam  Constitutional: He is oriented to person, place, and time. He appears well-developed and well-nourished.  Cardiovascular: Normal rate, normal  heart sounds and intact distal pulses.  No murmur heard. Pulmonary/Chest: Effort normal and breath sounds normal. He has no wheezes. He has no rales. He exhibits no tenderness.  Abdominal: Soft. Bowel sounds are normal. He exhibits no distension and no mass. There is no tenderness.  Musculoskeletal: Normal range of motion.  Neurological: He is alert and oriented to person, place, and time.  Psychiatric: He has a normal mood and affect.     Assessment & Plan:   1. Benign prostatic hyperplasia with urinary hesitancy UA negative for UTI S/p  simple retropubic prostatectomy Placed on Flomax - tamsulosin (FLOMAX) 0.4 MG CAPS capsule; Take 1 capsule (0.4 mg total) by mouth daily.  Dispense: 30 capsule; Refill: 3  2. Essential hypertension Slightly elevated above goal Continue current regimen Counseled on blood pressure goal of less than 130/80, low-sodium, DASH diet, medication compliance, 150 minutes of moderate intensity exercise per week. Discussed medication compliance, adverse effects.    Meds ordered this encounter  Medications  . tamsulosin (FLOMAX) 0.4 MG CAPS capsule    Sig: Take 1 capsule (0.4 mg total) by mouth daily.    Dispense:  30 capsule    Refill:  3    Follow-up: Return for Follow-up of chronic medical conditions, keep previously scheduled appointment.Hoy Register MD

## 2017-05-23 ENCOUNTER — Ambulatory Visit: Payer: Medicare Other | Admitting: Family Medicine

## 2017-05-30 ENCOUNTER — Emergency Department (HOSPITAL_COMMUNITY)
Admission: EM | Admit: 2017-05-30 | Discharge: 2017-05-30 | Disposition: A | Discharge status: Home / Self Care | Payer: 59 | Attending: Emergency Medicine | Admitting: Emergency Medicine

## 2017-05-30 ENCOUNTER — Encounter (HOSPITAL_COMMUNITY): Payer: Self-pay | Admitting: *Deleted

## 2017-05-30 ENCOUNTER — Other Ambulatory Visit: Payer: Self-pay

## 2017-05-30 DIAGNOSIS — Z9079 Acquired absence of other genital organ(s): Secondary | ICD-10-CM | POA: Diagnosis not present

## 2017-05-30 DIAGNOSIS — I10 Essential (primary) hypertension: Secondary | ICD-10-CM | POA: Diagnosis not present

## 2017-05-30 DIAGNOSIS — Z79899 Other long term (current) drug therapy: Secondary | ICD-10-CM | POA: Diagnosis not present

## 2017-05-30 DIAGNOSIS — R300 Dysuria: Secondary | ICD-10-CM | POA: Insufficient documentation

## 2017-05-30 DIAGNOSIS — K5900 Constipation, unspecified: Secondary | ICD-10-CM | POA: Diagnosis not present

## 2017-05-30 LAB — URINALYSIS, ROUTINE W REFLEX MICROSCOPIC
Bilirubin Urine: NEGATIVE
GLUCOSE, UA: NEGATIVE mg/dL
Hgb urine dipstick: NEGATIVE
KETONES UR: NEGATIVE mg/dL
LEUKOCYTES UA: NEGATIVE
NITRITE: NEGATIVE
PROTEIN: NEGATIVE mg/dL
Specific Gravity, Urine: 1.017 (ref 1.005–1.030)
pH: 6 (ref 5.0–8.0)

## 2017-05-30 MED ORDER — POLYETHYLENE GLYCOL 3350 17 G PO PACK: 17.0000 g | PACK | Freq: Every day | ORAL | 0 refills | Status: DC | PRN

## 2017-05-30 NOTE — ED Notes (Addendum)
Pt states he is here about his hypertension, not for the rash from the flomax. Pt states he already followed up with his MD regarding rash. Pt denies any vision changed, neurologically intact.

## 2017-05-30 NOTE — ED Triage Notes (Signed)
Pt in requesting a shot of Tamsulosin, pt states, "It messes my system up when I take the pills." pt c/o constipation and bil arm being swollen and a rash, pt c/o itching, pt reports symptom onset since starting Tamsulosin, pt states, "I have been taking it for a week." denies SOB, A&O x4

## 2017-05-30 NOTE — ED Provider Notes (Signed)
[facility] Provider Note   CSN: [id number] Arrival date & time: 05/30/17  1207     History   Chief Complaint Chief Complaint  Patient presents with  . Weakness    HPI Jesus Morrison is a 84 y.o. male.  HPI Patient presents with dysuria.  States that he has lower abdominal pain and some difficulty urinating at times.  States he has a history of constipation feels he is constipation.  Had previously told staff he was here for a rash from 1 of his pills.  Patient denies that the rash was the cause states he Has already been seen for that.  No chest pain.  No lightheadedness or dizziness.  No fevers or chills.  No numbness or weakness. Past Medical History:  Diagnosis Date  . Arthritis    fingers and hands  . BPH (benign prostatic hypertrophy)    problems with acute urinary retention- has indwelling foley catheter  . Cold (disease)    last week ( week of Dec 22- Dec 29 ) - no longer coughing, no fever and feels better now  . Enlarged prostate   . Hypertension     Patient Active Problem List   Diagnosis Date Noted  . BPH (benign prostatic hyperplasia) 05/19/2017  . Bradycardia 01/26/2016  . Hypertension 08/25/2015    Past Surgical History:  Procedure Laterality Date  .  Right elbow surgery as a child    . PROSTATECTOMY  04/17/2012   Procedure: PROSTATECTOMY RETROPUBIC;  Surgeon: Lindaann SloughMarc-Henry Nesi, MD;  Location: [facility];  Service: Urology;  Laterality: N/A;  Simple Retropubic Prostatectomy         Home Medications    Prior to Admission medications   Medication Sig Start Date End Date Taking? Authorizing Provider  amLODipine (NORVASC) 10 MG tablet Take 1 tablet (10 mg total) daily by mouth. 02/20/17   Hoy RegisterNewlin, Enobong, MD  cetirizine (ZYRTEC) 10 MG tablet Take 1 tablet (10 mg total) daily by mouth. 02/20/17   Hoy RegisterNewlin, Enobong, MD  hydrocortisone cream 0.5 % Apply 1 application topically 2 (two) times daily. 05/16/17   Hoy RegisterNewlin, Enobong, MD    isosorbide mononitrate (IMDUR) 60 MG 24 hr tablet Take 1 tablet (60 mg total) by mouth daily. 04/25/17   Hoy RegisterNewlin, Enobong, MD  lactulose (CHRONULAC) 10 GM/15ML solution Take 15 mLs (10 g total) by mouth 2 (two) times daily as needed for mild constipation. 05/01/17   Hoy RegisterNewlin, Enobong, MD  lisinopril-hydrochlorothiazide (PRINZIDE,ZESTORETIC) 20-25 MG tablet Take 1 tablet daily by mouth. 02/20/17   Hoy RegisterNewlin, Enobong, MD  polyethylene glycol (MIRALAX / GLYCOLAX) packet Take 17 g by mouth daily as needed for moderate constipation. 05/30/17   Benjiman CorePickering, Gaynell Eggleton, MD  tamsulosin (FLOMAX) 0.4 MG CAPS capsule Take 1 capsule (0.4 mg total) by mouth daily. 05/19/17   Hoy RegisterNewlin, Enobong, MD    Family History No family history on file.  Social History Social History   Tobacco Use  . Smoking status: Never Smoker  . Smokeless tobacco: Never Used  Substance Use Topics  . Alcohol use: Yes    Alcohol/week: 2.4 - 3.6 oz    Types: 3 - 4 Cans of beer, 1 - 2 Shots of liquor per week    Comment: "weekends"  . Drug use: No     Allergies   Patient has no known allergies.   Review of Systems Review of Systems  Constitutional: Negative for appetite change.  HENT: Negative for congestion.   Eyes: Negative for visual disturbance.  Respiratory: Negative for shortness of breath.   Cardiovascular: Negative for chest pain.  Gastrointestinal: Positive for constipation.  Genitourinary: Positive for dysuria.  Musculoskeletal: Negative for back pain.  Skin: Positive for rash.  Neurological: Negative for weakness and numbness.  Hematological: Negative for adenopathy.  Psychiatric/Behavioral: Negative for confusion.     Physical Exam Updated Vital Signs BP (!) 161/58   Pulse (!) 52   Temp 98.6 F (37 C) (Oral)   Resp 18   SpO2 99%   Physical Exam  Constitutional: He appears well-developed.  HENT:  Head: Atraumatic.  Neck: Neck supple.  Cardiovascular: Normal rate.  Pulmonary/Chest: Effort normal.   Abdominal: There is no tenderness.  Musculoskeletal: He exhibits no edema.  Neurological: He is alert.  Skin: Skin is warm. Capillary refill takes less than 2 seconds.  Psychiatric: He has a normal mood and affect.     ED Treatments / Results  Labs (all labs ordered are listed, but only abnormal results are displayed) Labs Reviewed  URINALYSIS, ROUTINE W REFLEX MICROSCOPIC    EKG  EKG Interpretation None       Radiology No results found.  Procedures Procedures (including critical care time)  Medications Ordered in ED Medications - No data to display   Initial Impression / Assessment and Plan / ED Course  I have reviewed the triage vital signs and the nursing notes.  Pertinent labs & imaging results that were available during my care of the patient were reviewed by me and considered in my medical decision making (see chart for details).     Patient with reported constipation.  Benign abdominal exam.  Also complaining of dysuria but no perineal tenderness or UTI.  Will give MiraLAX and have follow-up with PCP as needed.  Final Clinical Impressions(s) / ED Diagnoses   Final diagnoses:  Dysuria  Constipation, unspecified constipation type    ED Discharge Orders        Ordered    polyethylene glycol (MIRALAX / GLYCOLAX) packet  Daily PRN     05/30/17 1805       Benjiman Core, MD 05/30/17 1817

## 2017-05-30 NOTE — ED Notes (Signed)
Measure of post void residual was 0.

## 2017-07-17 ENCOUNTER — Telehealth: Payer: Self-pay | Admitting: Family Medicine

## 2017-07-17 NOTE — Telephone Encounter (Signed)
Pt left a copy of his bill for account [id number] DOS 04/25/17 for $111.67.  Pt has Medicare primary and [employer] secondary insurance coverage.   I called the billing department and spoke to [place].  She stated that the amount was applied to his Medicare 2019 deductible and that his secondary insurance was billed, but they do not pay on his Medicare deductible.  I left this message on patient's home phone and gave him the billing dept number [phone] to call if he had further questions.

## 2017-08-20 ENCOUNTER — Ambulatory Visit: Payer: 59 | Attending: Family Medicine | Admitting: Family Medicine

## 2017-08-20 ENCOUNTER — Encounter: Payer: Self-pay | Admitting: Family Medicine

## 2017-08-20 DIAGNOSIS — R3911 Hesitancy of micturition: Secondary | ICD-10-CM

## 2017-08-20 DIAGNOSIS — I10 Essential (primary) hypertension: Secondary | ICD-10-CM | POA: Diagnosis not present

## 2017-08-20 DIAGNOSIS — N401 Enlarged prostate with lower urinary tract symptoms: Secondary | ICD-10-CM | POA: Diagnosis not present

## 2017-08-20 DIAGNOSIS — Z79899 Other long term (current) drug therapy: Secondary | ICD-10-CM | POA: Diagnosis not present

## 2017-08-20 DIAGNOSIS — K5900 Constipation, unspecified: Secondary | ICD-10-CM | POA: Insufficient documentation

## 2017-08-20 DIAGNOSIS — K5909 Other constipation: Secondary | ICD-10-CM

## 2017-08-20 MED ORDER — LISINOPRIL-HYDROCHLOROTHIAZIDE 20-25 MG PO TABS: 1.0000 | ORAL_TABLET | Freq: Every day | ORAL | 6 refills | Status: DC

## 2017-08-20 MED ORDER — AMLODIPINE BESYLATE 10 MG PO TABS: 10.0000 mg | ORAL_TABLET | Freq: Every day | ORAL | 6 refills | Status: DC

## 2017-08-20 MED ORDER — LACTULOSE 10 GM/15ML PO SOLN: 10.0000 g | Freq: Two times a day (BID) | ORAL | 1 refills | Status: DC | PRN

## 2017-08-20 MED ORDER — ISOSORBIDE MONONITRATE ER 60 MG PO TB24: 60.0000 mg | ORAL_TABLET | Freq: Every day | ORAL | 6 refills | Status: DC

## 2017-08-20 MED ORDER — TAMSULOSIN HCL 0.4 MG PO CAPS: 0.4000 mg | ORAL_CAPSULE | Freq: Every day | ORAL | 6 refills | Status: DC

## 2017-08-20 NOTE — Patient Instructions (Signed)

## 2017-08-20 NOTE — Progress Notes (Signed)
Patient

## 2017-08-20 NOTE — Progress Notes (Signed)
Subjective:  Patient ID: Jesus Morrison, male    DOB: [birth year]  Age: 84 y.o. MRN: [id number]  CC: Hypertension   HPI Jesus Morrison is an 84 year old male with a history of hypertension, history of BPH status post retropubic prostatectomy in 04/2012 who presents today for follow-up visit.  He endorses compliance with his medications but his blood pressure is elevated today at 165/77 and on review of his chart at his last office visit his blood pressure was 142/62. He informs me his brother passed away and was buried last Wednesday in [place]; he has 2 surviving brothers and they are a source of support for him He complains of " having problem with his water" and endorses nocturia and dribbling.  I had commenced Flomax at his last office visit and on further questioning I am unable to ascertain compliance with Flomax as he informs me he takes 3 medications but his chart reveals he should be on 3 antihypertensives in addition to Flomax. Also complains of constipation.  He endorses eating a lot of cheese.  Prescribed lactulose in the past which he has run out of.  Denies nausea, vomiting, diarrhea, blood in stools.  Past Medical History:  Diagnosis Date  . Arthritis    fingers and hands  . BPH (benign prostatic hypertrophy)    problems with acute urinary retention- has indwelling foley catheter  . Cold (disease)    last week ( week of Dec 22- Dec 29 ) - no longer coughing, no fever and feels better now  . Enlarged prostate   . Hypertension     Past Surgical History:  Procedure Laterality Date  .  Right elbow surgery as a child    . PROSTATECTOMY  04/17/2012   Procedure: PROSTATECTOMY RETROPUBIC;  Surgeon: Hanley Ben, MD;  Location: [facility];  Service: Urology;  Laterality: N/A;  Simple Retropubic Prostatectomy      No Known Allergies   Outpatient Medications Prior to Visit  Medication Sig Dispense Refill  . cetirizine (ZYRTEC) 10 MG tablet Take 1 tablet (10 mg total) daily by mouth.  30 tablet 1  . hydrocortisone cream 0.5 % Apply 1 application topically 2 (two) times daily. 30 g 1  . polyethylene glycol (MIRALAX / GLYCOLAX) packet Take 17 g by mouth daily as needed for moderate constipation. 14 each 0  . amLODipine (NORVASC) 10 MG tablet Take 1 tablet (10 mg total) daily by mouth. 30 tablet 5  . isosorbide mononitrate (IMDUR) 60 MG 24 hr tablet Take 1 tablet (60 mg total) by mouth daily. 30 tablet 3  . lactulose (CHRONULAC) 10 GM/15ML solution Take 15 mLs (10 g total) by mouth 2 (two) times daily as needed for mild constipation. 946 mL 1  . lisinopril-hydrochlorothiazide (PRINZIDE,ZESTORETIC) 20-25 MG tablet Take 1 tablet daily by mouth. 30 tablet 5  . tamsulosin (FLOMAX) 0.4 MG CAPS capsule Take 1 capsule (0.4 mg total) by mouth daily. 30 capsule 3   No facility-administered medications prior to visit.     ROS Review of Systems  Constitutional: Negative for activity change and appetite change.  HENT: Negative for sinus pressure and sore throat.   Eyes: Negative for visual disturbance.  Respiratory: Negative for cough, chest tightness and shortness of breath.   Cardiovascular: Negative for chest pain and leg swelling.  Gastrointestinal: Positive for constipation. Negative for abdominal distention, abdominal pain and diarrhea.  Endocrine: Negative.   Genitourinary: Negative for dysuria.  Musculoskeletal: Negative for joint swelling and myalgias.  Skin: Negative for  rash.  Allergic/Immunologic: Negative.   Neurological: Negative for weakness, light-headedness and numbness.  Psychiatric/Behavioral: Negative for dysphoric mood and suicidal ideas.    Objective:  BP (!) 165/77   Pulse 61   Temp 98 F (36.7 C) (Oral)   Ht _0  (1.651 m)   Wt 176 lb 3.2 oz (79.9 kg)   SpO2 100%   BMI 29.32 kg/m   BP/Weight 08/20/2017 11/07/2033 08/22/7414  Systolic BP 384 536 468  Diastolic BP 77 58 62  Wt. (Lbs) 176.2 - 175.6  BMI 29.32 - 29.22      Physical Exam    Constitutional: He is oriented to person, place, and time. He appears well-developed and well-nourished.  Cardiovascular: Normal rate, normal heart sounds and intact distal pulses.  No murmur heard. Pulmonary/Chest: Effort normal and breath sounds normal. He has no wheezes. He has no rales. He exhibits no tenderness.  Abdominal: Soft. Bowel sounds are normal. He exhibits no distension and no mass. There is no tenderness.  Musculoskeletal: Normal range of motion.  Neurological: He is alert and oriented to person, place, and time.  Skin: Skin is warm and dry.  Psychiatric: He has a normal mood and affect.     Assessment & Plan:   1. Essential hypertension Elevated, his blood pressure at his last visit was 142/62 No regimen change today Continue low sodium, DASH diet - amLODipine (NORVASC) 10 MG tablet; Take 1 tablet (10 mg total) by mouth daily.  Dispense: 30 tablet; Refill: 6 - isosorbide mononitrate (IMDUR) 60 MG 24 hr tablet; Take 1 tablet (60 mg total) by mouth daily.  Dispense: 30 tablet; Refill: 6 - lisinopril-hydrochlorothiazide (PRINZIDE,ZESTORETIC) 20-25 MG tablet; Take 1 tablet by mouth daily.  Dispense: 30 tablet; Refill: 6 - CMP14+EGFR  2. Other constipation Advised to increase fiber intake We have discussed reducing foods that predispose to constipation including cheese which he eats a lot of - lactulose (CHRONULAC) 10 GM/15ML solution; Take 15 mLs (10 g total) by mouth 2 (two) times daily as needed for mild constipation.  Dispense: 946 mL; Refill: 1  3. Benign prostatic hyperplasia with urinary hesitancy He never picked up Flomax from the pharmacy We will resend prescription again - tamsulosin (FLOMAX) 0.4 MG CAPS capsule; Take 1 capsule (0.4 mg total) by mouth daily.  Dispense: 30 capsule; Refill: 6   Meds ordered this encounter  Medications  . amLODipine (NORVASC) 10 MG tablet    Sig: Take 1 tablet (10 mg total) by mouth daily.    Dispense:  30 tablet    Refill:   6  . isosorbide mononitrate (IMDUR) 60 MG 24 hr tablet    Sig: Take 1 tablet (60 mg total) by mouth daily.    Dispense:  30 tablet    Refill:  6    Discontinue hydralazine  . lisinopril-hydrochlorothiazide (PRINZIDE,ZESTORETIC) 20-25 MG tablet    Sig: Take 1 tablet by mouth daily.    Dispense:  30 tablet    Refill:  6  . lactulose (CHRONULAC) 10 GM/15ML solution    Sig: Take 15 mLs (10 g total) by mouth 2 (two) times daily as needed for mild constipation.    Dispense:  946 mL    Refill:  1  . tamsulosin (FLOMAX) 0.4 MG CAPS capsule    Sig: Take 1 capsule (0.4 mg total) by mouth daily.    Dispense:  30 capsule    Refill:  6    Follow-up: Return in about 3 months (around 11/20/2017) for  follow up chronic medical condition.   Charlott Rakes MD

## 2017-08-21 ENCOUNTER — Telehealth: Payer: Self-pay

## 2017-08-21 LAB — CMP14+EGFR
ALBUMIN: 4 g/dL (ref 3.5–4.7)
ALK PHOS: 51 IU/L (ref 39–117)
ALT: 12 IU/L (ref 0–44)
AST: 14 IU/L (ref 0–40)
Albumin/Globulin Ratio: 1.6 (ref 1.2–2.2)
BUN / CREAT RATIO: 19 (ref 10–24)
BUN: 15 mg/dL (ref 8–27)
Bilirubin Total: 0.9 mg/dL (ref 0.0–1.2)
CHLORIDE: 103 mmol/L (ref 96–106)
CO2: 25 mmol/L (ref 20–29)
CREATININE: 0.78 mg/dL (ref 0.76–1.27)
Calcium: 9 mg/dL (ref 8.6–10.2)
GFR calc Af Amer: 93 mL/min/{1.73_m2} (ref >59)
GFR calc non Af Amer: 81 mL/min/{1.73_m2} (ref >59)
Globulin, Total: 2.5 g/dL (ref 1.5–4.5)
Glucose: 86 mg/dL (ref 65–99)
Potassium: 4.7 mmol/L (ref 3.5–5.2)
Sodium: 143 mmol/L (ref 134–144)
Total Protein: 6.5 g/dL (ref 6.0–8.5)

## 2017-08-21 NOTE — Telephone Encounter (Signed)
Patient was called and a voicemail was left informing patient to return phone call for lab results.    If patient returns phone call please inform patient of results below.

## 2017-08-21 NOTE — Telephone Encounter (Signed)
-----   Message from Hoy Register, MD sent at 08/21/2017  1:26 PM EDT ----- Please inform the patient that labs are normal. Thank you.

## 2017-09-19 ENCOUNTER — Emergency Department (HOSPITAL_COMMUNITY)
Admission: EM | Admit: 2017-09-19 | Discharge: 2017-09-19 | Disposition: A | Discharge status: Home / Self Care | Payer: 59 | Attending: Physician Assistant | Admitting: Physician Assistant

## 2017-09-19 ENCOUNTER — Encounter (HOSPITAL_COMMUNITY): Payer: Self-pay | Admitting: *Deleted

## 2017-09-19 ENCOUNTER — Other Ambulatory Visit: Payer: Self-pay

## 2017-09-19 DIAGNOSIS — R300 Dysuria: Secondary | ICD-10-CM | POA: Diagnosis present

## 2017-09-19 DIAGNOSIS — N4889 Other specified disorders of penis: Secondary | ICD-10-CM | POA: Insufficient documentation

## 2017-09-19 DIAGNOSIS — K5900 Constipation, unspecified: Secondary | ICD-10-CM | POA: Diagnosis not present

## 2017-09-19 DIAGNOSIS — I10 Essential (primary) hypertension: Secondary | ICD-10-CM | POA: Insufficient documentation

## 2017-09-19 DIAGNOSIS — R350 Frequency of micturition: Secondary | ICD-10-CM | POA: Diagnosis not present

## 2017-09-19 DIAGNOSIS — Z79899 Other long term (current) drug therapy: Secondary | ICD-10-CM | POA: Diagnosis not present

## 2017-09-19 LAB — BASIC METABOLIC PANEL
ANION GAP: 7 (ref 5–15)
BUN: 15 mg/dL (ref 6–20)
CHLORIDE: 107 mmol/L (ref 101–111)
CO2: 28 mmol/L (ref 22–32)
CREATININE: 0.93 mg/dL (ref 0.61–1.24)
Calcium: 8.8 mg/dL — ABNORMAL LOW (ref 8.9–10.3)
GFR calc Af Amer: >60 mL/min (ref >60)
GFR calc non Af Amer: >60 mL/min (ref >60)
GLUCOSE: 101 mg/dL — AB (ref 65–99)
Potassium: 3.6 mmol/L (ref 3.5–5.1)
Sodium: 142 mmol/L (ref 135–145)

## 2017-09-19 LAB — URINALYSIS, ROUTINE W REFLEX MICROSCOPIC
BILIRUBIN URINE: NEGATIVE
Bacteria, UA: NONE SEEN
GLUCOSE, UA: NEGATIVE mg/dL
Hgb urine dipstick: NEGATIVE
KETONES UR: NEGATIVE mg/dL
LEUKOCYTES UA: NEGATIVE
NITRITE: NEGATIVE
PH: 5 (ref 5.0–8.0)
Protein, ur: 30 mg/dL — AB
SPECIFIC GRAVITY, URINE: 1.019 (ref 1.005–1.030)

## 2017-09-19 LAB — CBC WITH DIFFERENTIAL/PLATELET
Abs Immature Granulocytes: 0 10*3/uL (ref 0.0–0.1)
BASOS PCT: 1 %
Basophils Absolute: 0 10*3/uL (ref 0.0–0.1)
Eosinophils Absolute: 0.1 10*3/uL (ref 0.0–0.7)
Eosinophils Relative: 2 %
HEMATOCRIT: 41.4 % (ref 39.0–52.0)
HEMOGLOBIN: 12.9 g/dL — AB (ref 13.0–17.0)
Immature Granulocytes: 0 %
LYMPHS ABS: 1.8 10*3/uL (ref 0.7–4.0)
Lymphocytes Relative: 31 %
MCH: 28 pg (ref 26.0–34.0)
MCHC: 31.2 g/dL (ref 30.0–36.0)
MCV: 89.8 fL (ref 78.0–100.0)
MONO ABS: 0.6 10*3/uL (ref 0.1–1.0)
MONOS PCT: 10 %
NEUTROS ABS: 3.4 10*3/uL (ref 1.7–7.7)
NEUTROS PCT: 56 %
Platelets: 229 10*3/uL (ref 150–400)
RBC: 4.61 MIL/uL (ref 4.22–5.81)
RDW: 13.6 % (ref 11.5–15.5)
WBC: 5.9 10*3/uL (ref 4.0–10.5)

## 2017-09-19 MED ORDER — POLYETHYLENE GLYCOL 3350 17 G PO PACK: 17.0000 g | PACK | Freq: Every day | ORAL | 0 refills | Status: DC

## 2017-09-19 NOTE — ED Triage Notes (Signed)
Pt took BP medication on arrival, hypertensive in triage

## 2017-09-19 NOTE — ED Provider Notes (Signed)
[facility] EMERGENCY DEPARTMENT Provider Note   CSN: [id number] Arrival date & time: 09/19/17  1027     History   Chief Complaint Chief Complaint  Patient presents with  . Dysuria    HPI Jesus Morrison is a 84 y.o. male with a past medical history of BPH, hypertension, who presents to ED for evaluation of several day history of dysuria, penile pain, frequent urination.  He also reports constipation.  Patient is a poor historian so unable to get a good history.  It appears that he has had an history of constipation in the past.  He takes some type of over-the-counter "sweetener" to help move his bowels.  He has not taken it in the past week.  Unsure why.  He also reports feeling like his dysuria is caused by his "prostate problems."  He is unable to tell me if he still taking his Flomax.  But he does state that the "prostate medicine" helps with his dysuria.  He said he takes his medications daily but is unable to recall the names of them.  Denies any hematochezia or melena, abdominal pain, penile discharge, testicular pain or swelling, fevers.  HPI  Past Medical History:  Diagnosis Date  . Arthritis    fingers and hands  . BPH (benign prostatic hypertrophy)    problems with acute urinary retention- has indwelling foley catheter  . Cold (disease)    last week ( week of Dec 22- Dec 29 ) - no longer coughing, no fever and feels better now  . Enlarged prostate   . Hypertension     Patient Active Problem List   Diagnosis Date Noted  . BPH (benign prostatic hyperplasia) 05/19/2017  . Bradycardia 01/26/2016  . Hypertension 08/25/2015    Past Surgical History:  Procedure Laterality Date  .  Right elbow surgery as a child    . PROSTATECTOMY  04/17/2012   Procedure: PROSTATECTOMY RETROPUBIC;  Surgeon: Lindaann Slough, MD;  Location: [facility];  Service: Urology;  Laterality: N/A;  Simple Retropubic Prostatectomy          Home Medications    Prior to Admission  medications   Medication Sig Start Date End Date Taking? Authorizing Provider  amLODipine (NORVASC) 10 MG tablet Take 1 tablet (10 mg total) by mouth daily. 08/20/17  Yes Hoy Register, MD  cetirizine (ZYRTEC) 10 MG tablet Take 1 tablet (10 mg total) daily by mouth. 02/20/17  Yes Hoy Register, MD  hydrocortisone cream 0.5 % Apply 1 application topically 2 (two) times daily. 05/16/17  Yes Hoy Register, MD  isosorbide mononitrate (IMDUR) 60 MG 24 hr tablet Take 1 tablet (60 mg total) by mouth daily. 08/20/17  Yes Hoy Register, MD  lactulose (CHRONULAC) 10 GM/15ML solution Take 15 mLs (10 g total) by mouth 2 (two) times daily as needed for mild constipation. 08/20/17  Yes Hoy Register, MD  lisinopril-hydrochlorothiazide (PRINZIDE,ZESTORETIC) 20-25 MG tablet Take 1 tablet by mouth daily. 08/20/17  Yes Hoy Register, MD  tamsulosin (FLOMAX) 0.4 MG CAPS capsule Take 1 capsule (0.4 mg total) by mouth daily. 08/20/17  Yes Hoy Register, MD  polyethylene glycol (MIRALAX / GLYCOLAX) packet Take 17 g by mouth daily. 09/19/17   Dietrich Pates, PA-C    Family History History reviewed. No pertinent family history.  Social History Social History   Tobacco Use  . Smoking status: Never Smoker  . Smokeless tobacco: Never Used  Substance Use Topics  . Alcohol use: Yes    Alcohol/week: 2.4 -  3.6 oz    Types: 3 - 4 Cans of beer, 1 - 2 Shots of liquor per week    Comment: "weekends"  . Drug use: No     Allergies   Patient has no known allergies.   Review of Systems Review of Systems  Constitutional: Negative for appetite change, chills and fever.  HENT: Negative for ear pain, rhinorrhea, sneezing and sore throat.   Eyes: Negative for photophobia and visual disturbance.  Respiratory: Negative for cough, chest tightness, shortness of breath and wheezing.   Cardiovascular: Negative for chest pain and palpitations.  Gastrointestinal: Positive for constipation. Negative for abdominal pain, blood in  stool, diarrhea, nausea and vomiting.  Genitourinary: Positive for dysuria, frequency and penile pain. Negative for genital sores, hematuria, scrotal swelling, testicular pain and urgency.  Musculoskeletal: Negative for myalgias.  Skin: Negative for rash.  Neurological: Negative for dizziness, weakness and light-headedness.     Physical Exam Updated Vital Signs BP (!) 151/75   Pulse 90   Temp 97.7 F (36.5 C) (Oral)   Resp 16   SpO2 98%   Physical Exam  Constitutional: He appears well-developed and well-nourished. No distress.  HENT:  Head: Normocephalic and atraumatic.  Nose: Nose normal.  Eyes: Conjunctivae and EOM are normal. Left eye exhibits no discharge. No scleral icterus.  Neck: Normal range of motion. Neck supple.  Cardiovascular: Normal rate, regular rhythm, normal heart sounds and intact distal pulses. Exam reveals no gallop and no friction rub.  No murmur heard. Pulmonary/Chest: Effort normal and breath sounds normal. No respiratory distress.  Abdominal: Soft. Bowel sounds are normal. He exhibits no distension. There is no tenderness. There is no guarding.  Musculoskeletal: Normal range of motion. He exhibits no edema.  Neurological: He is alert. He exhibits normal muscle tone. Coordination normal.  Skin: Skin is warm and dry. No rash noted.  Psychiatric: He has a normal mood and affect.  Nursing note and vitals reviewed.    ED Treatments / Results  Labs (all labs ordered are listed, but only abnormal results are displayed) Labs Reviewed  URINALYSIS, ROUTINE W REFLEX MICROSCOPIC - Abnormal; Notable for the following components:      Result Value   Protein, ur 30 (*)    All other components within normal limits  BASIC METABOLIC PANEL - Abnormal; Notable for the following components:   Glucose, Bld 101 (*)    Calcium 8.8 (*)    All other components within normal limits  CBC WITH DIFFERENTIAL/PLATELET - Abnormal; Notable for the following components:    Hemoglobin 12.9 (*)    All other components within normal limits    EKG None  Radiology No results found.  Procedures Procedures (including critical care time)  Medications Ordered in ED Medications - No data to display   Initial Impression / Assessment and Plan / ED Course  I have reviewed the triage vital signs and the nursing notes.  Pertinent labs & imaging results that were available during my care of the patient were reviewed by me and considered in my medical decision making (see chart for details).     Patient presents to ED for evaluation of several day history of dysuria, penile pain, frequent frequent urination and constipation.  Patient is a poor historian so unable to get a good history.  He takes some over-the-counter medication to help move his bowels.  He also takes Flomax that helps with his urinary symptoms.  On physical exam he is overall well-appearing.  He has  no abdominal tenderness to palpation.  Urinalysis, CBC, BMP unremarkable.  Suspect that his symptoms are due to constipation.  No signs of UTI.  Will discharge home with MiraLAX which has helped him in the past.  Advised to follow-up with his primary care provider and return to ED for any severe worsening symptoms. Patient discussed with and seen by my attending, Dr. Corlis LeakMackuen.  Portions of this note were generated with Scientist, clinical (histocompatibility and immunogenetics)Dragon dictation software. Dictation errors may occur despite best attempts at proofreading.   Final Clinical Impressions(s) / ED Diagnoses   Final diagnoses:  Constipation, unspecified constipation type    ED Discharge Orders        Ordered    polyethylene glycol (MIRALAX / GLYCOLAX) packet  Daily     09/19/17 1709       Dietrich PatesKhatri, Bela Nyborg, PA-C 09/19/17 1715    Mackuen, Cindee Saltourteney Lyn, MD 09/19/17 2015

## 2017-09-19 NOTE — ED Notes (Signed)
Pt complains of difficulty urinating.

## 2017-09-19 NOTE — ED Notes (Signed)
Pt ambulatory from lobby to exam room with steady gait.

## 2017-09-19 NOTE — ED Triage Notes (Signed)
Pt in c/o frequent urination and pain with urination, denies fever or pain unless urinating, also reports intermittent penile pain

## 2017-09-19 NOTE — ED Notes (Signed)
ED Provider at bedside. 

## 2017-10-21 ENCOUNTER — Ambulatory Visit: Payer: 59 | Admitting: Family Medicine

## 2017-11-07 ENCOUNTER — Other Ambulatory Visit (HOSPITAL_COMMUNITY)
Admission: RE | Admit: 2017-11-07 | Discharge: 2017-11-07 | Disposition: A | Discharge status: Home / Self Care | Payer: Medicare Other | Source: Ambulatory Visit | Attending: Family Medicine | Admitting: Family Medicine

## 2017-11-07 ENCOUNTER — Ambulatory Visit: Payer: 59 | Attending: Family Medicine | Admitting: Family Medicine

## 2017-11-07 ENCOUNTER — Encounter: Payer: Self-pay | Admitting: Family Medicine

## 2017-11-07 VITALS — BP 150/38 | HR 57 | Temp 98.2°F | Ht 65.0 in | Wt 176.8 lb

## 2017-11-07 DIAGNOSIS — K4090 Unilateral inguinal hernia, without obstruction or gangrene, not specified as recurrent: Secondary | ICD-10-CM

## 2017-11-07 DIAGNOSIS — Z79899 Other long term (current) drug therapy: Secondary | ICD-10-CM | POA: Diagnosis not present

## 2017-11-07 DIAGNOSIS — N4889 Other specified disorders of penis: Secondary | ICD-10-CM

## 2017-11-07 DIAGNOSIS — M19049 Primary osteoarthritis, unspecified hand: Secondary | ICD-10-CM | POA: Insufficient documentation

## 2017-11-07 DIAGNOSIS — Z9079 Acquired absence of other genital organ(s): Secondary | ICD-10-CM | POA: Diagnosis not present

## 2017-11-07 DIAGNOSIS — I10 Essential (primary) hypertension: Secondary | ICD-10-CM | POA: Diagnosis not present

## 2017-11-07 LAB — POCT URINALYSIS DIPSTICK
BILIRUBIN UA: NEGATIVE
GLUCOSE UA: NEGATIVE
Leukocytes, UA: NEGATIVE
Nitrite, UA: NEGATIVE
Protein, UA: POSITIVE — AB
RBC UA: NEGATIVE
Spec Grav, UA: 1.02 (ref 1.010–1.025)
Urobilinogen, UA: 1 E.U./dL
pH, UA: 6 (ref 5.0–8.0)

## 2017-11-07 MED ORDER — CIPROFLOXACIN HCL 500 MG PO TABS: 500.0000 mg | ORAL_TABLET | Freq: Two times a day (BID) | ORAL | 0 refills | Status: DC

## 2017-11-07 MED ORDER — LISINOPRIL-HYDROCHLOROTHIAZIDE 20-12.5 MG PO TABS: 2.0000 | ORAL_TABLET | Freq: Every day | ORAL | 6 refills | Status: DC

## 2017-11-07 NOTE — Progress Notes (Signed)
Subjective:  Patient ID: Jesus Morrison, male    DOB: 30-Jun-1926  Age: 84 y.o. MRN: [id number]  CC: Penis Pain   HPI Jesus Morrison  is an 84 year old male with a history of hypertension, history of BPH status post retropubic prostatectomy in 04/2012 who presents today for acute visit complaining of intermittent penile pain which he states wakes him up at night.  Denies penile discharge, dysuria and lower urinary tract symptoms have been controlled on Flomax.  He informs me his previous PCP would usually give him a penicillin shot which would bring about resolution.  He has not been sexually active in ages. His blood pressure is elevated and he endorses taking his antihypertensives however he states he had a salty sandwich today.  Past Medical History:  Diagnosis Date  . Arthritis    fingers and hands  . BPH (benign prostatic hypertrophy)    problems with acute urinary retention- has indwelling foley catheter  . Cold (disease)    last week ( week of Dec 22- Dec 29 ) - no longer coughing, no fever and feels better now  . Enlarged prostate   . Hypertension     Past Surgical History:  Procedure Laterality Date  .  Right elbow surgery as a child    . PROSTATECTOMY  04/17/2012   Procedure: PROSTATECTOMY RETROPUBIC;  Surgeon: Lindaann Slough, MD;  Location: [facility];  Service: Urology;  Laterality: N/A;  Simple Retropubic Prostatectomy      No Known Allergies   Outpatient Medications Prior to Visit  Medication Sig Dispense Refill  . amLODipine (NORVASC) 10 MG tablet Take 1 tablet (10 mg total) by mouth daily. 30 tablet 6  . cetirizine (ZYRTEC) 10 MG tablet Take 1 tablet (10 mg total) daily by mouth. 30 tablet 1  . hydrocortisone cream 0.5 % Apply 1 application topically 2 (two) times daily. 30 g 1  . isosorbide mononitrate (IMDUR) 60 MG 24 hr tablet Take 1 tablet (60 mg total) by mouth daily. 30 tablet 6  . lactulose (CHRONULAC) 10 GM/15ML solution Take 15 mLs (10 g total) by mouth 2 (two)  times daily as needed for mild constipation. 946 mL 1  . polyethylene glycol (MIRALAX / GLYCOLAX) packet Take 17 g by mouth daily. 14 each 0  . tamsulosin (FLOMAX) 0.4 MG CAPS capsule Take 1 capsule (0.4 mg total) by mouth daily. 30 capsule 6  . lisinopril-hydrochlorothiazide (PRINZIDE,ZESTORETIC) 20-25 MG tablet Take 1 tablet by mouth daily. 30 tablet 6   No facility-administered medications prior to visit.     ROS Review of Systems  Constitutional: Negative for activity change and appetite change.  HENT: Negative for sinus pressure and sore throat.   Eyes: Negative for visual disturbance.  Respiratory: Negative for cough, chest tightness and shortness of breath.   Cardiovascular: Negative for chest pain and leg swelling.  Gastrointestinal: Negative for abdominal distention, abdominal pain, constipation and diarrhea.  Endocrine: Negative.   Genitourinary: Positive for penile pain. Negative for discharge, dysuria and testicular pain.  Musculoskeletal: Negative for joint swelling and myalgias.  Skin: Negative for rash.  Allergic/Immunologic: Negative.   Neurological: Negative for weakness, light-headedness and numbness.  Psychiatric/Behavioral: Negative for dysphoric mood and suicidal ideas.    Objective:  BP (!) 150/38   Pulse (!) 57   Temp 98.2 F (36.8 C) (Oral)   Ht 5\' 5"  (1.651 m)   Wt 176 lb 12.8 oz (80.2 kg)   SpO2 99%   BMI 29.42 kg/m   BP/Weight  11/07/2017 09/19/2017 08/20/2017  Systolic BP 150 154 165  Diastolic BP 38 62 77  Wt. (Lbs) 176.8 - 176.2  BMI 29.42 - 29.32      Physical Exam  Constitutional: He is oriented to person, place, and time. He appears well-developed and well-nourished.  Cardiovascular: Normal rate, normal heart sounds and intact distal pulses.  No murmur heard. Pulmonary/Chest: Effort normal and breath sounds normal. He has no wheezes. He has no rales. He exhibits no tenderness.  Abdominal: Soft. Bowel sounds are normal. He exhibits no  distension and no mass. There is no tenderness.  Genitourinary: Penis normal.  Genitourinary Comments: Inguinoscrotal hernia No tenderness elicited on exam  Musculoskeletal: Normal range of motion.  Neurological: He is alert and oriented to person, place, and time.     Assessment & Plan:   1. Essential hypertension Uncontrolled Increased dose of lisinopril/HCTZ - lisinopril-hydrochlorothiazide (ZESTORETIC) 20-12.5 MG tablet; Take 2 tablets by mouth daily.  Dispense: 60 tablet; Refill: 6  2. Penile pain UA negative for UTI We will treat presumptively with ciprofloxacin in the event that he does have some prostatitis Pain could also be referred and also from his scrotal hernia - Urine cytology ancillary only - POCT urinalysis dipstick  3. Scrotal hernia He is not interested in surgical intervention Advised to obtain  jockstrap for support - ciprofloxacin (CIPRO) 500 MG tablet; Take 1 tablet (500 mg total) by mouth 2 (two) times daily.  Dispense: 6 tablet; Refill: 0   Meds ordered this encounter  Medications  . lisinopril-hydrochlorothiazide (ZESTORETIC) 20-12.5 MG tablet    Sig: Take 2 tablets by mouth daily.    Dispense:  60 tablet    Refill:  6    Discontinue previous dose  . ciprofloxacin (CIPRO) 500 MG tablet    Sig: Take 1 tablet (500 mg total) by mouth 2 (two) times daily.    Dispense:  6 tablet    Refill:  0    Follow-up: Return in about 3 months (around 02/07/2018) for Follow-up on hypertension.   Hoy RegisterEnobong Freida Nebel MD

## 2017-11-10 LAB — URINE CYTOLOGY ANCILLARY ONLY
Bacterial vaginitis: NEGATIVE
CANDIDA VAGINITIS: NEGATIVE
Chlamydia: NEGATIVE
NEISSERIA GONORRHEA: NEGATIVE
TRICH (WINDOWPATH): NEGATIVE

## 2017-11-13 ENCOUNTER — Telehealth: Payer: Self-pay

## 2017-11-13 NOTE — Telephone Encounter (Signed)
Patient was called and a voicemail was left informing patient to contact office for lab results.    If patient returns phone call please inform patient that his urine test was negative.

## 2017-11-21 ENCOUNTER — Other Ambulatory Visit: Payer: Self-pay | Admitting: Family Medicine

## 2017-11-27 ENCOUNTER — Ambulatory Visit: Payer: 59 | Admitting: Family Medicine

## 2018-02-12 ENCOUNTER — Ambulatory Visit: Payer: 59 | Attending: Family Medicine | Admitting: Family Medicine

## 2018-02-12 ENCOUNTER — Encounter: Payer: Self-pay | Admitting: Family Medicine

## 2018-02-12 VITALS — BP 158/68 | HR 65 | Temp 97.9°F | Ht 65.0 in | Wt 175.4 lb

## 2018-02-12 DIAGNOSIS — Z79899 Other long term (current) drug therapy: Secondary | ICD-10-CM | POA: Insufficient documentation

## 2018-02-12 DIAGNOSIS — R3911 Hesitancy of micturition: Secondary | ICD-10-CM | POA: Insufficient documentation

## 2018-02-12 DIAGNOSIS — N401 Enlarged prostate with lower urinary tract symptoms: Secondary | ICD-10-CM | POA: Insufficient documentation

## 2018-02-12 DIAGNOSIS — I10 Essential (primary) hypertension: Secondary | ICD-10-CM | POA: Diagnosis not present

## 2018-02-12 DIAGNOSIS — Z23 Encounter for immunization: Secondary | ICD-10-CM | POA: Insufficient documentation

## 2018-02-12 DIAGNOSIS — R05 Cough: Secondary | ICD-10-CM | POA: Diagnosis not present

## 2018-02-12 DIAGNOSIS — R059 Cough, unspecified: Secondary | ICD-10-CM

## 2018-02-12 MED ORDER — TAMSULOSIN HCL 0.4 MG PO CAPS: 0.4000 mg | ORAL_CAPSULE | Freq: Every day | ORAL | 6 refills | Status: DC

## 2018-02-12 MED ORDER — ISOSORBIDE MONONITRATE ER 60 MG PO TB24: 60.0000 mg | ORAL_TABLET | Freq: Every day | ORAL | 6 refills | Status: DC

## 2018-02-12 MED ORDER — CETIRIZINE HCL 10 MG PO TABS: 10.0000 mg | ORAL_TABLET | Freq: Every day | ORAL | 1 refills | Status: DC

## 2018-02-12 MED ORDER — PNEUMOCOCCAL 13-VAL CONJ VACC IM SUSP: 0.5000 mL | INTRAMUSCULAR | 0 refills | Status: AC

## 2018-02-12 MED ORDER — CETIRIZINE HCL 10 MG PO TABS: 10.0000 mg | ORAL_TABLET | Freq: Every day | ORAL | 1 refills | Status: AC

## 2018-02-12 MED ORDER — LISINOPRIL-HYDROCHLOROTHIAZIDE 20-12.5 MG PO TABS: 2.0000 | ORAL_TABLET | Freq: Every day | ORAL | 6 refills | Status: DC

## 2018-02-12 MED ORDER — AMLODIPINE BESYLATE 10 MG PO TABS: 10.0000 mg | ORAL_TABLET | Freq: Every day | ORAL | 6 refills | Status: DC

## 2018-02-13 ENCOUNTER — Other Ambulatory Visit: Payer: 59

## 2018-02-13 NOTE — Progress Notes (Signed)
Subjective:  Patient ID: Jesus Morrison, male    DOB: 1927-03-20  Age: 84 y.o. MRN: [id number]  CC: Hypertension   HPI Jesus Morrison  is an 84 year old male with a history of hypertension, history of BPH status post retropubic prostatectomy in 04/2012 who presents today for a follow-up visit.  He is yet to take his antihypertensives hence his elevated blood pressure. He reports tolerating his medications well and the constipation which he had previously complained about has resolved. His lower urinary tract symptoms are controlled on Flomax. He denies chest pains, shortness of breath, pedal edema or abdominal pain. He has no additional concerns today.  Past Medical History:  Diagnosis Date  . Arthritis    fingers and hands  . BPH (benign prostatic hypertrophy)    problems with acute urinary retention- has indwelling foley catheter  . Cold (disease)    last week ( week of Dec 22- Dec 29 ) - no longer coughing, no fever and feels better now  . Enlarged prostate   . Hypertension     Past Surgical History:  Procedure Laterality Date  .  Right elbow surgery as a child    . PROSTATECTOMY  04/17/2012   Procedure: PROSTATECTOMY RETROPUBIC;  Surgeon: Hanley Ben, MD;  Location: [facility];  Service: Urology;  Laterality: N/A;  Simple Retropubic Prostatectomy      No Known Allergies   Outpatient Medications Prior to Visit  Medication Sig Dispense Refill  . ciprofloxacin (CIPRO) 500 MG tablet Take 1 tablet (500 mg total) by mouth 2 (two) times daily. 6 tablet 0  . hydrocortisone cream 0.5 % Apply 1 application topically 2 (two) times daily. 30 g 1  . lactulose (CHRONULAC) 10 GM/15ML solution Take 15 mLs (10 g total) by mouth 2 (two) times daily as needed for mild constipation. 946 mL 1  . polyethylene glycol (MIRALAX / GLYCOLAX) packet Take 17 g by mouth daily. 14 each 0  . amLODipine (NORVASC) 10 MG tablet Take 1 tablet (10 mg total) by mouth daily. 30 tablet 6  . cetirizine (ZYRTEC) 10  MG tablet Take 1 tablet (10 mg total) daily by mouth. 30 tablet 1  . isosorbide mononitrate (IMDUR) 60 MG 24 hr tablet Take 1 tablet (60 mg total) by mouth daily. 30 tablet 6  . lisinopril-hydrochlorothiazide (ZESTORETIC) 20-12.5 MG tablet Take 2 tablets by mouth daily. 60 tablet 6  . tamsulosin (FLOMAX) 0.4 MG CAPS capsule Take 1 capsule (0.4 mg total) by mouth daily. 30 capsule 6   No facility-administered medications prior to visit.     ROS Review of Systems  Constitutional: Negative for activity change and appetite change.  HENT: Negative for sinus pressure and sore throat.   Eyes: Negative for visual disturbance.  Respiratory: Negative for cough, chest tightness and shortness of breath.   Cardiovascular: Negative for chest pain and leg swelling.  Gastrointestinal: Negative for abdominal distention, abdominal pain, constipation and diarrhea.  Endocrine: Negative.   Genitourinary: Negative for dysuria.  Musculoskeletal: Negative for joint swelling and myalgias.  Skin: Negative for rash.  Allergic/Immunologic: Negative.   Neurological: Negative for weakness, light-headedness and numbness.  Psychiatric/Behavioral: Negative for dysphoric mood and suicidal ideas.    Objective:  BP (!) 158/68   Pulse 65   Temp 97.9 F (36.6 C) (Oral)   Ht 5' 5"  (1.651 m)   Wt 175 lb 6.4 oz (79.6 kg)   SpO2 98%   BMI 29.19 kg/m   BP/Weight 02/12/2018 2/97/9892 04/16/9415  Systolic BP  161 096 045  Diastolic BP 68 38 62  Wt. (Lbs) 175.4 176.8 -  BMI 29.19 29.42 -      Physical Exam  Constitutional: He is oriented to person, place, and time. He appears well-developed and well-nourished.  Cardiovascular: Normal rate, normal heart sounds and intact distal pulses.  No murmur heard. Pulmonary/Chest: Effort normal and breath sounds normal. He has no wheezes. He has no rales. He exhibits no tenderness.  Abdominal: Soft. Bowel sounds are normal. He exhibits no distension and no mass. There is no  tenderness.  Musculoskeletal: Normal range of motion.  Neurological: He is alert and oriented to person, place, and time.  Skin: Skin is warm and dry.  Psychiatric: He has a normal mood and affect.     Assessment & Plan:   1. Essential hypertension Uncontrolled Yet to take his antihypertensives Counseled on blood pressure goal of less than 130/80, low-sodium, DASH diet, medication compliance, 150 minutes of moderate intensity exercise per week. Discussed medication compliance, adverse effects. - CMP14+EGFR; Future - Lipid panel; Future - lisinopril-hydrochlorothiazide (ZESTORETIC) 20-12.5 MG tablet; Take 2 tablets by mouth daily.  Dispense: 60 tablet; Refill: 6 - isosorbide mononitrate (IMDUR) 60 MG 24 hr tablet; Take 1 tablet (60 mg total) by mouth daily.  Dispense: 30 tablet; Refill: 6 - amLODipine (NORVASC) 10 MG tablet; Take 1 tablet (10 mg total) by mouth daily.  Dispense: 30 tablet; Refill: 6  2. Cough Stable - cetirizine (ZYRTEC) 10 MG tablet; Take 1 tablet (10 mg total) by mouth daily.  Dispense: 30 tablet; Refill: 1  3. Benign prostatic hyperplasia with urinary hesitancy Controlled - PSA, total and free; Future - tamsulosin (FLOMAX) 0.4 MG CAPS capsule; Take 1 capsule (0.4 mg total) by mouth daily.  Dispense: 30 capsule; Refill: 6  4. Need for pneumococcal vaccination PCV 13 administered   Meds ordered this encounter  Medications  . pneumococcal 13-valent conjugate vaccine (PREVNAR 13) SUSP injection    Sig: Inject 0.5 mLs into the muscle tomorrow at 10 am for 1 dose.    Dispense:  0.5 mL    Refill:  0  . DISCONTD: amLODipine (NORVASC) 10 MG tablet    Sig: Take 1 tablet (10 mg total) by mouth daily.    Dispense:  30 tablet    Refill:  6  . DISCONTD: cetirizine (ZYRTEC) 10 MG tablet    Sig: Take 1 tablet (10 mg total) by mouth daily.    Dispense:  30 tablet    Refill:  1  . DISCONTD: isosorbide mononitrate (IMDUR) 60 MG 24 hr tablet    Sig: Take 1 tablet (60  mg total) by mouth daily.    Dispense:  30 tablet    Refill:  6    Discontinue hydralazine  . DISCONTD: lisinopril-hydrochlorothiazide (ZESTORETIC) 20-12.5 MG tablet    Sig: Take 2 tablets by mouth daily.    Dispense:  60 tablet    Refill:  6    Discontinue previous dose  . DISCONTD: tamsulosin (FLOMAX) 0.4 MG CAPS capsule    Sig: Take 1 capsule (0.4 mg total) by mouth daily.    Dispense:  30 capsule    Refill:  6  . tamsulosin (FLOMAX) 0.4 MG CAPS capsule    Sig: Take 1 capsule (0.4 mg total) by mouth daily.    Dispense:  30 capsule    Refill:  6  . lisinopril-hydrochlorothiazide (ZESTORETIC) 20-12.5 MG tablet    Sig: Take 2 tablets by mouth daily.  Dispense:  60 tablet    Refill:  6    Discontinue previous dose  . isosorbide mononitrate (IMDUR) 60 MG 24 hr tablet    Sig: Take 1 tablet (60 mg total) by mouth daily.    Dispense:  30 tablet    Refill:  6    Discontinue hydralazine  . amLODipine (NORVASC) 10 MG tablet    Sig: Take 1 tablet (10 mg total) by mouth daily.    Dispense:  30 tablet    Refill:  6  . cetirizine (ZYRTEC) 10 MG tablet    Sig: Take 1 tablet (10 mg total) by mouth daily.    Dispense:  30 tablet    Refill:  1    Follow-up: Return in about 3 months (around 05/15/2018) for follow up of chronic medical conditions.   Charlott Rakes MD

## 2018-02-17 ENCOUNTER — Ambulatory Visit: Payer: Medicare Other | Attending: Family Medicine

## 2018-02-17 DIAGNOSIS — I10 Essential (primary) hypertension: Secondary | ICD-10-CM

## 2018-02-17 DIAGNOSIS — R3911 Hesitancy of micturition: Secondary | ICD-10-CM

## 2018-02-17 DIAGNOSIS — N401 Enlarged prostate with lower urinary tract symptoms: Secondary | ICD-10-CM

## 2018-02-18 LAB — LIPID PANEL
CHOL/HDL RATIO: 2.8 ratio (ref 0.0–5.0)
CHOLESTEROL TOTAL: 160 mg/dL (ref 100–199)
HDL: 57 mg/dL (ref >39)
LDL CALC: 95 mg/dL (ref 0–99)
Triglycerides: 41 mg/dL (ref 0–149)
VLDL Cholesterol Cal: 8 mg/dL (ref 5–40)

## 2018-02-18 LAB — CMP14+EGFR
A/G RATIO: 1.6 (ref 1.2–2.2)
ALT: 7 IU/L (ref 0–44)
AST: 12 IU/L (ref 0–40)
Albumin: 4 g/dL (ref 3.5–4.7)
Alkaline Phosphatase: 55 IU/L (ref 39–117)
BILIRUBIN TOTAL: 0.9 mg/dL (ref 0.0–1.2)
BUN/Creatinine Ratio: 20 (ref 10–24)
BUN: 18 mg/dL (ref 8–27)
CHLORIDE: 100 mmol/L (ref 96–106)
CO2: 27 mmol/L (ref 20–29)
Calcium: 9.3 mg/dL (ref 8.6–10.2)
Creatinine, Ser: 0.9 mg/dL (ref 0.76–1.27)
GFR, EST AFRICAN AMERICAN: 87 mL/min/{1.73_m2} (ref >59)
GFR, EST NON AFRICAN AMERICAN: 75 mL/min/{1.73_m2} (ref >59)
GLOBULIN, TOTAL: 2.5 g/dL (ref 1.5–4.5)
Glucose: 84 mg/dL (ref 65–99)
POTASSIUM: 4.6 mmol/L (ref 3.5–5.2)
SODIUM: 141 mmol/L (ref 134–144)
Total Protein: 6.5 g/dL (ref 6.0–8.5)

## 2018-02-18 LAB — PSA, TOTAL AND FREE
PSA FREE PCT: 29.4 %
PSA FREE: 0.97 ng/mL
Prostate Specific Ag, Serum: 3.3 ng/mL (ref 0.0–4.0)

## 2018-02-23 ENCOUNTER — Telehealth: Payer: Self-pay

## 2018-02-23 NOTE — Telephone Encounter (Signed)
-----   Message from Hoy Register, MD sent at 02/18/2018  1:39 PM EST ----- Please inform the patient that labs are normal. Thank you.

## 2018-02-23 NOTE — Telephone Encounter (Signed)
Patient was called and informed to contact office for lab results.   If patient returns phone call please inform patient that labs are normal. 

## 2018-05-18 ENCOUNTER — Ambulatory Visit: Payer: 59 | Admitting: Family Medicine

## 2018-05-20 ENCOUNTER — Encounter: Payer: Self-pay | Admitting: Family Medicine

## 2018-05-20 ENCOUNTER — Ambulatory Visit: Payer: 59 | Attending: Family Medicine | Admitting: Family Medicine

## 2018-05-20 VITALS — BP 180/60 | HR 52 | Temp 98.1°F | Ht 65.0 in | Wt 175.6 lb

## 2018-05-20 DIAGNOSIS — Z79899 Other long term (current) drug therapy: Secondary | ICD-10-CM | POA: Insufficient documentation

## 2018-05-20 DIAGNOSIS — N401 Enlarged prostate with lower urinary tract symptoms: Secondary | ICD-10-CM | POA: Diagnosis not present

## 2018-05-20 DIAGNOSIS — R338 Other retention of urine: Secondary | ICD-10-CM | POA: Insufficient documentation

## 2018-05-20 DIAGNOSIS — M1990 Unspecified osteoarthritis, unspecified site: Secondary | ICD-10-CM | POA: Insufficient documentation

## 2018-05-20 DIAGNOSIS — R21 Rash and other nonspecific skin eruption: Secondary | ICD-10-CM | POA: Diagnosis not present

## 2018-05-20 DIAGNOSIS — I10 Essential (primary) hypertension: Secondary | ICD-10-CM

## 2018-05-20 MED ORDER — TRIAMCINOLONE ACETONIDE 0.1 % EX CREA: 1.0000 "application " | TOPICAL_CREAM | Freq: Two times a day (BID) | CUTANEOUS | 1 refills | Status: AC

## 2018-05-20 NOTE — Patient Instructions (Signed)

## 2018-05-20 NOTE — Progress Notes (Signed)
Patient states that rash goes away and comes back.

## 2018-05-20 NOTE — Progress Notes (Signed)
Subjective:  Patient ID: Jesus Morrison, male    DOB: 04/02/1927  Age: 85 y.o. MRN: [id number]  CC: Hypertension   HPI Jesus Morrison  is an 85 year old male with a history of hypertension, history of BPH status post retropubic prostatectomy in 04/2012 who presents today for a follow-up visit. His blood pressure is elevated and he states he has been compliant with his antihypertensives but only has Imdur and Lisinopril/hctz with him but no Amlodipine. He complains of a pruritic rash in his lower extremities which has been pruritic and intermittent. Denies presence of rash in other body parts. Denies use of new body products. There are no relieving or aggravating factors.  Past Medical History:  Diagnosis Date  . Arthritis    fingers and hands  . BPH (benign prostatic hypertrophy)    problems with acute urinary retention- has indwelling foley catheter  . Cold (disease)    last week ( week of Dec 22- Dec 29 ) - no longer coughing, no fever and feels better now  . Enlarged prostate   . Hypertension     Past Surgical History:  Procedure Laterality Date  .  Right elbow surgery as a child    . PROSTATECTOMY  04/17/2012   Procedure: PROSTATECTOMY RETROPUBIC;  Surgeon: Lindaann Slough, MD;  Location: [facility];  Service: Urology;  Laterality: N/A;  Simple Retropubic Prostatectomy      History reviewed. No pertinent family history.  No Known Allergies  Outpatient Medications Prior to Visit  Medication Sig Dispense Refill  . amLODipine (NORVASC) 10 MG tablet Take 1 tablet (10 mg total) by mouth daily. 30 tablet 6  . cetirizine (ZYRTEC) 10 MG tablet Take 1 tablet (10 mg total) by mouth daily. 30 tablet 1  . hydrocortisone cream 0.5 % Apply 1 application topically 2 (two) times daily. 30 g 1  . isosorbide mononitrate (IMDUR) 60 MG 24 hr tablet Take 1 tablet (60 mg total) by mouth daily. 30 tablet 6  . lactulose (CHRONULAC) 10 GM/15ML solution Take 15 mLs (10 g total) by mouth 2 (two) times daily  as needed for mild constipation. 946 mL 1  . lisinopril-hydrochlorothiazide (ZESTORETIC) 20-12.5 MG tablet Take 2 tablets by mouth daily. 60 tablet 6  . polyethylene glycol (MIRALAX / GLYCOLAX) packet Take 17 g by mouth daily. 14 each 0  . tamsulosin (FLOMAX) 0.4 MG CAPS capsule Take 1 capsule (0.4 mg total) by mouth daily. 30 capsule 6  . ciprofloxacin (CIPRO) 500 MG tablet Take 1 tablet (500 mg total) by mouth 2 (two) times daily. (Patient not taking: Reported on 05/20/2018) 6 tablet 0   No facility-administered medications prior to visit.      ROS Review of Systems General: negative for fever, weight loss, appetite change Eyes: no visual symptoms. ENT: no ear symptoms, no sinus tenderness, no nasal congestion or sore throat. Neck: no pain  Respiratory: no wheezing, shortness of breath, cough Cardiovascular: no chest pain, no dyspnea on exertion, no pedal edema, no orthopnea. Gastrointestinal: no abdominal pain, no diarrhea, no constipation Genito-Urinary: no urinary frequency, no dysuria, no polyuria. Hematologic: no bruising Endocrine: no cold or heat intolerance Neurological: no headaches, no seizures, no tremors Musculoskeletal: no joint pains, no joint swelling Skin: see hpi Psychological: no depression, no anxiety,    Objective:  BP (!) 180/60   Pulse (!) 52   Temp 98.1 F (36.7 C) (Oral)   Ht 5\' 5"  (1.651 m)   Wt 175 lb 9.6 oz (79.7 kg)  SpO2 100%   BMI 29.22 kg/m   BP/Weight 05/20/2018 02/12/2018 11/07/2017  Systolic BP 180 158 150  Diastolic BP 60 68 38  Wt. (Lbs) 175.6 175.4 176.8  BMI 29.22 29.19 29.42      Physical Exam Constitutional: normal appearing,  Eyes: PERRLA HEENT: Head is atraumatic, normal sinuses, normal oropharynx, normal appearing tonsils and palate, tympanic membrane is normal bilaterally. Neck: normal range of motion, no thyromegaly, no JVD Cardiovascular: normal rate and rhythm, normal heart sounds, no murmurs, rub or gallop, no pedal  edema Respiratory: clear to auscultation bilaterally, no wheezes, no rales, no rhonchi Abdomen: soft, not tender to palpation, normal bowel sounds, no enlarged organs Extremities: Full ROM, no tenderness in joints Skin: Hyperpigmented scaly rash on bilateral lower extremities absent on other parts of body. Neurological: alert, oriented x3, cranial nerves I-XII grossly intact , normal motor strength, normal sensation. Psychological: normal mood.   CMP Latest Ref Rng & Units 02/17/2018 09/19/2017 08/20/2017  Glucose 65 - 99 mg/dL 84 161(W101(H) 86  BUN 8 - 27 mg/dL 18 15 15   Creatinine 0.76 - 1.27 mg/dL 9.600.90 4.540.93 0.980.78  Sodium 134 - 144 mmol/L 141 142 143  Potassium 3.5 - 5.2 mmol/L 4.6 3.6 4.7  Chloride 96 - 106 mmol/L 100 107 103  CO2 20 - 29 mmol/L 27 28 25   Calcium 8.6 - 10.2 mg/dL 9.3 1.1(B8.8(L) 9.0  Total Protein 6.0 - 8.5 g/dL 6.5 - 6.5  Total Bilirubin 0.0 - 1.2 mg/dL 0.9 - 0.9  Alkaline Phos 39 - 117 IU/L 55 - 51  AST 0 - 40 IU/L 12 - 14  ALT 0 - 44 IU/L 7 - 12    Lipid Panel     Component Value Date/Time   CHOL 160 02/17/2018 0829   TRIG 41 02/17/2018 0829   HDL 57 02/17/2018 0829   CHOLHDL 2.8 02/17/2018 0829   CHOLHDL 2.6 10/27/2015 0943   VLDL 10 10/27/2015 0943   LDLCALC 95 02/17/2018 0829    CBC    Component Value Date/Time   WBC 5.9 09/19/2017 1446   RBC 4.61 09/19/2017 1446   HGB 12.9 (L) 09/19/2017 1446   HCT 41.4 09/19/2017 1446   PLT 229 09/19/2017 1446   MCV 89.8 09/19/2017 1446   MCH 28.0 09/19/2017 1446   MCHC 31.2 09/19/2017 1446   RDW 13.6 09/19/2017 1446   LYMPHSABS 1.8 09/19/2017 1446   MONOABS 0.6 09/19/2017 1446   EOSABS 0.1 09/19/2017 1446   BASOSABS 0.0 09/19/2017 1446      Assessment & Plan:   1. Essential hypertension Uncontrolled He has not been taking Amlodipine Reviewed his medications with him and he will be checking with the Pharmacy in the event that he did not receive Amlodipine Counseled on blood pressure goal of less than 130/80,  low-sodium, DASH diet, medication compliance, 150 minutes of moderate intensity exercise per week. Discussed medication compliance, adverse effects.   2. Rash and nonspecific skin eruption - triamcinolone cream (KENALOG) 0.1 %; Apply 1 application topically 2 (two) times daily.  Dispense: 30 g; Refill: 1   Meds ordered this encounter  Medications  . triamcinolone cream (KENALOG) 0.1 %    Sig: Apply 1 application topically 2 (two) times daily.    Dispense:  30 g    Refill:  1    Follow-up: Return in about 3 months (around 08/18/2018) for follow up of chronic medical conditions.   Hoy RegisterEnobong Aberdeen Hafen MD    Hoy RegisterEnobong Petronella Shuford, MD, FAAFP. [facility] [place], [place] [phone]   05/20/2018, 3:32 PM

## 2018-08-19 ENCOUNTER — Other Ambulatory Visit: Payer: Self-pay

## 2018-08-19 ENCOUNTER — Encounter: Payer: Self-pay | Admitting: Family Medicine

## 2018-08-19 ENCOUNTER — Ambulatory Visit: Payer: Medicare Other | Attending: Family Medicine | Admitting: Family Medicine

## 2018-08-19 DIAGNOSIS — I10 Essential (primary) hypertension: Secondary | ICD-10-CM

## 2018-08-19 DIAGNOSIS — R3911 Hesitancy of micturition: Secondary | ICD-10-CM | POA: Diagnosis not present

## 2018-08-19 DIAGNOSIS — N401 Enlarged prostate with lower urinary tract symptoms: Secondary | ICD-10-CM

## 2018-08-19 MED ORDER — TAMSULOSIN HCL 0.4 MG PO CAPS: 0.4000 mg | ORAL_CAPSULE | Freq: Every day | ORAL | 1 refills | Status: DC

## 2018-08-19 MED ORDER — ISOSORBIDE MONONITRATE ER 60 MG PO TB24: 60.0000 mg | ORAL_TABLET | Freq: Every day | ORAL | 1 refills | Status: DC

## 2018-08-19 MED ORDER — AMLODIPINE BESYLATE 10 MG PO TABS: 10.0000 mg | ORAL_TABLET | Freq: Every day | ORAL | 1 refills | Status: DC

## 2018-08-19 MED ORDER — LISINOPRIL-HYDROCHLOROTHIAZIDE 20-12.5 MG PO TABS: 2.0000 | ORAL_TABLET | Freq: Every day | ORAL | 1 refills | Status: DC

## 2018-08-19 MED ORDER — MISC. DEVICES MISC: 0 refills | Status: DC

## 2018-08-19 NOTE — Progress Notes (Signed)
Patient has been called and DOB has been verified. Patient has been screened and transferred to PCP to start phone visit.     

## 2018-08-19 NOTE — Progress Notes (Signed)
Virtual Visit via Telephone Note  I connected with Jesus Morrison Kerkhoff, on 08/19/2018 at 1:57 PM by telephone due to the COVID-19 pandemic and verified that I am speaking with the correct person using two identifiers.   Consent: I discussed the limitations, risks, security and privacy concerns of performing an evaluation and management service by telephone and the availability of in person appointments. I also discussed with the patient that there may be a patient responsible charge related to this service. The patient expressed understanding and agreed to proceed.   Location of Patient: Home  Location of Provider: Clinic   Persons participating in Telemedicine visit: Anner CreteGentry Tuggle  Alycia Farrington - CMA Dr Alvis LemmingsNewlin - PCP     History of Present Illness: Jesus Morrison Schwertner  is an 85 year old male with a history of hypertension, history of BPH status post retropubic prostatectomy in 04/2012 who presents today for a follow-up visit. He does not have the means of checking his blood pressure but states he has been compliant with his antihypertensives and his major form of exercise is walking. He is requesting a penicillin shot as ' he has not received one ever since his previous PCP Dr. Bruna PotterBlount gave him a shot'.  On further questioning he states penicillin shot was to help control his blood pressure. I have educated him regarding the use of penicillin for infections and not for blood pressure control. He denies chest pain, dyspnea, pedal edema and has no additional concerns today.   Past Medical History:  Diagnosis Date  . Arthritis    fingers and hands  . BPH (benign prostatic hypertrophy)    problems with acute urinary retention- has indwelling foley catheter  . Cold (disease)    last week ( week of Dec 22- Dec 29 ) - no longer coughing, no fever and feels better now  . Enlarged prostate   . Hypertension    No Known Allergies  Current Outpatient Medications on File Prior to Visit  Medication Sig  Dispense Refill  . amLODipine (NORVASC) 10 MG tablet Take 1 tablet (10 mg total) by mouth daily. 30 tablet 6  . cetirizine (ZYRTEC) 10 MG tablet Take 1 tablet (10 mg total) by mouth daily. 30 tablet 1  . isosorbide mononitrate (IMDUR) 60 MG 24 hr tablet Take 1 tablet (60 mg total) by mouth daily. 30 tablet 6  . lactulose (CHRONULAC) 10 GM/15ML solution Take 15 mLs (10 g total) by mouth 2 (two) times daily as needed for mild constipation. 946 mL 1  . lisinopril-hydrochlorothiazide (ZESTORETIC) 20-12.5 MG tablet Take 2 tablets by mouth daily. 60 tablet 6  . polyethylene glycol (MIRALAX / GLYCOLAX) packet Take 17 g by mouth daily. 14 each 0  . tamsulosin (FLOMAX) 0.4 MG CAPS capsule Take 1 capsule (0.4 mg total) by mouth daily. 30 capsule 6  . triamcinolone cream (KENALOG) 0.1 % Apply 1 application topically 2 (two) times daily. 30 g 1  . ciprofloxacin (CIPRO) 500 MG tablet Take 1 tablet (500 mg total) by mouth 2 (two) times daily. (Patient not taking: Reported on 05/20/2018) 6 tablet 0  . hydrocortisone cream 0.5 % Apply 1 application topically 2 (two) times daily. (Patient not taking: Reported on 08/19/2018) 30 g 1   No current facility-administered medications on file prior to visit.     Observations/Objective: Awake, alert, oriented x3 Not in acute distress  Assessment and Plan: 1. Essential hypertension He has had uncontrolled blood pressures in the past We will write prescription for blood pressure monitor  Counseled on blood pressure goal of less than 130/80, low-sodium, DASH diet, medication compliance, 150 minutes of moderate intensity exercise per week. Discussed medication compliance, adverse effects. - amLODipine (NORVASC) 10 MG tablet; Take 1 tablet (10 mg total) by mouth daily.  Dispense: 90 tablet; Refill: 1 - isosorbide mononitrate (IMDUR) 60 MG 24 hr tablet; Take 1 tablet (60 mg total) by mouth daily.  Dispense: 90 tablet; Refill: 1 - lisinopril-hydrochlorothiazide (ZESTORETIC)  20-12.5 MG tablet; Take 2 tablets by mouth daily.  Dispense: 180 tablet; Refill: 1  2. Benign prostatic hyperplasia with urinary hesitancy Stable - tamsulosin (FLOMAX) 0.4 MG CAPS capsule; Take 1 capsule (0.4 mg total) by mouth daily.  Dispense: 90 capsule; Refill: 1   Follow Up Instructions: Return in about 3 months (around 11/19/2018).    I discussed the assessment and treatment plan with the patient. The patient was provided an opportunity to ask questions and all were answered. The patient agreed with the plan and demonstrated an understanding of the instructions.   The patient was advised to call back or seek an in-person evaluation if the symptoms worsen or if the condition fails to improve as anticipated.     I provided 15 minutes total of non-face-to-face time during this encounter including median intraservice time, reviewing previous notes, labs, imaging, medications and explaining diagnosis and management.     Hoy Register, MD, FAAFP. [facility] [place], [place] [phone]   08/19/2018, 1:57 PM

## 2018-11-23 ENCOUNTER — Ambulatory Visit: Payer: 59 | Attending: Family Medicine | Admitting: Family Medicine

## 2018-11-23 ENCOUNTER — Other Ambulatory Visit: Payer: Self-pay

## 2018-11-23 ENCOUNTER — Encounter: Payer: Self-pay | Admitting: Family Medicine

## 2018-11-23 DIAGNOSIS — N401 Enlarged prostate with lower urinary tract symptoms: Secondary | ICD-10-CM | POA: Diagnosis not present

## 2018-11-23 DIAGNOSIS — R3911 Hesitancy of micturition: Secondary | ICD-10-CM | POA: Diagnosis not present

## 2018-11-23 DIAGNOSIS — I10 Essential (primary) hypertension: Secondary | ICD-10-CM

## 2018-11-23 DIAGNOSIS — K5909 Other constipation: Secondary | ICD-10-CM | POA: Diagnosis not present

## 2018-11-23 MED ORDER — LACTULOSE 10 GM/15ML PO SOLN: 10.0000 g | Freq: Two times a day (BID) | ORAL | 1 refills | Status: DC | PRN

## 2018-11-23 MED ORDER — TAMSULOSIN HCL 0.4 MG PO CAPS: 0.4000 mg | ORAL_CAPSULE | Freq: Every day | ORAL | 1 refills | Status: DC

## 2018-11-23 MED ORDER — ISOSORBIDE MONONITRATE ER 60 MG PO TB24: 60.0000 mg | ORAL_TABLET | Freq: Every day | ORAL | 1 refills | Status: DC

## 2018-11-23 MED ORDER — LISINOPRIL-HYDROCHLOROTHIAZIDE 20-12.5 MG PO TABS: 2.0000 | ORAL_TABLET | Freq: Every day | ORAL | 1 refills | Status: DC

## 2018-11-23 MED ORDER — AMLODIPINE BESYLATE 10 MG PO TABS: 10.0000 mg | ORAL_TABLET | Freq: Every day | ORAL | 1 refills | Status: DC

## 2018-11-23 NOTE — Progress Notes (Signed)
Subjective:  Patient ID: Jesus Morrison, male    DOB: 06-04-1926  Age: 85 y.o. MRN: [id number]  CC: Hypertension   HPI Jesus Morrison is a 85 -year-old male with a history of hypertension, history of BPH status post retropubic prostatectomy in 04/2012 who presents today for a follow-up visit. His blood pressure is elevated and he is yet to take his antihypertensive today as he is fasting in anticipation of labs.  He denies chest pains, dyspnea and his constipation is controlled on his current regimen. He currently works part-time at [employer] and is very active. He has no additional concerns today. For his benign prostatic hyperplasia he misplaced his tamsulosin bottle and has not been taking it but promises to resume it.  Past Medical History:  Diagnosis Date   Arthritis    fingers and hands   BPH (benign prostatic hypertrophy)    problems with acute urinary retention- has indwelling foley catheter   Cold (disease)    last week ( week of Dec 22- Dec 29 ) - no longer coughing, no fever and feels better now   Enlarged prostate    Hypertension     Past Surgical History:  Procedure Laterality Date    Right elbow surgery as a child     PROSTATECTOMY  04/17/2012   Procedure: PROSTATECTOMY RETROPUBIC;  Surgeon: Hanley Ben, MD;  Location: [facility];  Service: Urology;  Laterality: N/A;  Simple Retropubic Prostatectomy      History reviewed. No pertinent family history.  No Known Allergies  Outpatient Medications Prior to Visit  Medication Sig Dispense Refill   cetirizine (ZYRTEC) 10 MG tablet Take 1 tablet (10 mg total) by mouth daily. 30 tablet 1   Misc. Devices MISC Blood Pressure monitor Dx: Hypertension 1 each 0   polyethylene glycol (MIRALAX / GLYCOLAX) packet Take 17 g by mouth daily. 14 each 0   triamcinolone cream (KENALOG) 0.1 % Apply 1 application topically 2 (two) times daily. 30 g 1   amLODipine (NORVASC) 10 MG tablet Take 1 tablet (10 mg total) by mouth  daily. 90 tablet 1   isosorbide mononitrate (IMDUR) 60 MG 24 hr tablet Take 1 tablet (60 mg total) by mouth daily. 90 tablet 1   lactulose (CHRONULAC) 10 GM/15ML solution Take 15 mLs (10 g total) by mouth 2 (two) times daily as needed for mild constipation. 946 mL 1   lisinopril-hydrochlorothiazide (ZESTORETIC) 20-12.5 MG tablet Take 2 tablets by mouth daily. 180 tablet 1   tamsulosin (FLOMAX) 0.4 MG CAPS capsule Take 1 capsule (0.4 mg total) by mouth daily. 90 capsule 1   hydrocortisone cream 0.5 % Apply 1 application topically 2 (two) times daily. (Patient not taking: Reported on 08/19/2018) 30 g 1   No facility-administered medications prior to visit.      ROS Review of Systems  Constitutional: Negative for activity change and appetite change.  HENT: Negative for sinus pressure and sore throat.   Eyes: Negative for visual disturbance.  Respiratory: Negative for cough, chest tightness and shortness of breath.   Cardiovascular: Negative for chest pain and leg swelling.  Gastrointestinal: Negative for abdominal distention, abdominal pain, constipation and diarrhea.  Endocrine: Negative.   Genitourinary: Negative for dysuria.  Musculoskeletal: Negative for joint swelling and myalgias.  Skin: Negative for rash.  Allergic/Immunologic: Negative.   Neurological: Negative for weakness, light-headedness and numbness.  Psychiatric/Behavioral: Negative for dysphoric mood and suicidal ideas.    Objective:  BP (!) 160/83    Pulse 67  Temp 98 F (36.7 C) (Oral)    Ht _0  (1.651 m)    Wt 169 lb 12.8 oz (77 kg)    SpO2 99%    BMI 28.26 kg/m   BP/Weight 11/23/2018 05/20/2018 40/98/1191  Systolic BP 478 295 621  Diastolic BP 83 60 68  Wt. (Lbs) 169.8 175.6 175.4  BMI 28.26 29.22 29.19      Physical Exam Constitutional:      Appearance: He is well-developed.  Cardiovascular:     Rate and Rhythm: Normal rate.     Heart sounds: Normal heart sounds. No murmur.  Pulmonary:     Effort:  Pulmonary effort is normal.     Breath sounds: Normal breath sounds. No wheezing or rales.  Chest:     Chest wall: No tenderness.  Abdominal:     General: Bowel sounds are normal. There is no distension.     Palpations: Abdomen is soft. There is no mass.     Tenderness: There is no abdominal tenderness.  Musculoskeletal: Normal range of motion.  Neurological:     Mental Status: He is alert and oriented to person, place, and time.     CMP Latest Ref Rng & Units 02/17/2018 09/19/2017 08/20/2017  Glucose 65 - 99 mg/dL 84 101(H) 86  BUN 8 - 27 mg/dL _1 Creatinine 0.76 - 1.27 mg/dL 0.90 0.93 0.78  Sodium 134 - 144 mmol/L 141 142 143  Potassium 3.5 - 5.2 mmol/L 4.6 3.6 4.7  Chloride 96 - 106 mmol/L 100 107 103  CO2 20 - 29 mmol/L _2 Calcium 8.6 - 10.2 mg/dL 9.3 8.8(L) 9.0  Total Protein 6.0 - 8.5 g/dL 6.5 - 6.5  Total Bilirubin 0.0 - 1.2 mg/dL 0.9 - 0.9  Alkaline Phos 39 - 117 IU/L 55 - 51  AST 0 - 40 IU/L 12 - 14  ALT 0 - 44 IU/L 7 - 12    Lipid Panel     Component Value Date/Time   CHOL 160 02/17/2018 0829   TRIG 41 02/17/2018 0829   HDL 57 02/17/2018 0829   CHOLHDL 2.8 02/17/2018 0829   CHOLHDL 2.6 10/27/2015 0943   VLDL 10 10/27/2015 0943   LDLCALC 95 02/17/2018 0829    CBC    Component Value Date/Time   WBC 5.9 09/19/2017 1446   RBC 4.61 09/19/2017 1446   HGB 12.9 (L) 09/19/2017 1446   HCT 41.4 09/19/2017 1446   PLT 229 09/19/2017 1446   MCV 89.8 09/19/2017 1446   MCH 28.0 09/19/2017 1446   MCHC 31.2 09/19/2017 1446   RDW 13.6 09/19/2017 1446   LYMPHSABS 1.8 09/19/2017 1446   MONOABS 0.6 09/19/2017 1446   EOSABS 0.1 09/19/2017 1446   BASOSABS 0.0 09/19/2017 1446    No results found for: HGBA1C  Assessment & Plan:   1. Benign prostatic hyperplasia with urinary hesitancy Stable - tamsulosin (FLOMAX) 0.4 MG CAPS capsule; Take 1 capsule (0.4 mg total) by mouth daily.  Dispense: 90 capsule; Refill: 1  2. Other constipation Controlled -  lactulose (CHRONULAC) 10 GM/15ML solution; Take 15 mLs (10 g total) by mouth 2 (two) times daily as needed for mild constipation.  Dispense: 946 mL; Refill: 1  3. Essential hypertension Uncontrolled due to not taking medications today Compliance emphasized Counseled on blood pressure goal of less than 130/80, low-sodium, DASH diet, medication compliance, 150 minutes of moderate intensity exercise per week. Discussed medication compliance, adverse effects. - CMP14+EGFR - Lipid panel -  amLODipine (NORVASC) 10 MG tablet; Take 1 tablet (10 mg total) by mouth daily.  Dispense: 90 tablet; Refill: 1 - lisinopril-hydrochlorothiazide (ZESTORETIC) 20-12.5 MG tablet; Take 2 tablets by mouth daily.  Dispense: 180 tablet; Refill: 1 - isosorbide mononitrate (IMDUR) 60 MG 24 hr tablet; Take 1 tablet (60 mg total) by mouth daily.  Dispense: 90 tablet; Refill: 1   Meds ordered this encounter  Medications   tamsulosin (FLOMAX) 0.4 MG CAPS capsule    Sig: Take 1 capsule (0.4 mg total) by mouth daily.    Dispense:  90 capsule    Refill:  1   lactulose (CHRONULAC) 10 GM/15ML solution    Sig: Take 15 mLs (10 g total) by mouth 2 (two) times daily as needed for mild constipation.    Dispense:  946 mL    Refill:  1   amLODipine (NORVASC) 10 MG tablet    Sig: Take 1 tablet (10 mg total) by mouth daily.    Dispense:  90 tablet    Refill:  1   lisinopril-hydrochlorothiazide (ZESTORETIC) 20-12.5 MG tablet    Sig: Take 2 tablets by mouth daily.    Dispense:  180 tablet    Refill:  1    Discontinue previous dose   isosorbide mononitrate (IMDUR) 60 MG 24 hr tablet    Sig: Take 1 tablet (60 mg total) by mouth daily.    Dispense:  90 tablet    Refill:  1    Discontinue hydralazine    Follow-up: Return in about 6 months (around 05/26/2019) for medical conditions.       Charlott Rakes, MD, FAAFP. [facility] and [place], [place]   11/23/2018, 9:40 AM

## 2018-11-23 NOTE — Patient Instructions (Signed)

## 2018-11-24 LAB — LIPID PANEL
Chol/HDL Ratio: 2.8 ratio (ref 0.0–5.0)
Cholesterol, Total: 165 mg/dL (ref 100–199)
HDL: 58 mg/dL (ref >39)
LDL Calculated: 98 mg/dL (ref 0–99)
Triglycerides: 47 mg/dL (ref 0–149)
VLDL Cholesterol Cal: 9 mg/dL (ref 5–40)

## 2018-11-24 LAB — CMP14+EGFR
ALT: 9 IU/L (ref 0–44)
AST: 14 IU/L (ref 0–40)
Albumin/Globulin Ratio: 1.3 (ref 1.2–2.2)
Albumin: 3.9 g/dL (ref 3.5–4.6)
Alkaline Phosphatase: 57 IU/L (ref 39–117)
BUN/Creatinine Ratio: 20 (ref 10–24)
BUN: 17 mg/dL (ref 10–36)
Bilirubin Total: 0.7 mg/dL (ref 0.0–1.2)
CO2: 25 mmol/L (ref 20–29)
Calcium: 9.1 mg/dL (ref 8.6–10.2)
Chloride: 105 mmol/L (ref 96–106)
Creatinine, Ser: 0.86 mg/dL (ref 0.76–1.27)
GFR calc Af Amer: 88 mL/min/{1.73_m2} (ref >59)
GFR calc non Af Amer: 76 mL/min/{1.73_m2} (ref >59)
Globulin, Total: 2.9 g/dL (ref 1.5–4.5)
Glucose: 96 mg/dL (ref 65–99)
Potassium: 4.4 mmol/L (ref 3.5–5.2)
Sodium: 143 mmol/L (ref 134–144)
Total Protein: 6.8 g/dL (ref 6.0–8.5)

## 2018-11-25 ENCOUNTER — Telehealth: Payer: Self-pay

## 2018-11-25 NOTE — Telephone Encounter (Signed)
-----   Message from Charlott Rakes, MD sent at 11/25/2018 12:54 PM EDT ----- Please inform the patient that labs are normal. Thank you.

## 2018-11-25 NOTE — Telephone Encounter (Signed)
Patient was called and a voicemail was left informing patient to return phone call for lab results. 

## 2018-11-26 NOTE — Telephone Encounter (Signed)
Patient was called and a voicemail was left informing patient to return phone call. 

## 2019-01-08 ENCOUNTER — Ambulatory Visit: Payer: Medicare Other | Admitting: Family Medicine

## 2019-01-08 ENCOUNTER — Other Ambulatory Visit: Payer: Self-pay

## 2019-01-20 ENCOUNTER — Emergency Department (HOSPITAL_COMMUNITY)
Admission: EM | Admit: 2019-01-20 | Discharge: 2019-01-20 | Discharge status: Against Medical Advice | Payer: 59 | Attending: Emergency Medicine | Admitting: Emergency Medicine

## 2019-01-20 ENCOUNTER — Other Ambulatory Visit: Payer: Self-pay

## 2019-01-20 ENCOUNTER — Encounter (HOSPITAL_COMMUNITY): Payer: Self-pay

## 2019-01-20 DIAGNOSIS — Z5321 Procedure and treatment not carried out due to patient leaving prior to being seen by health care provider: Secondary | ICD-10-CM | POA: Diagnosis not present

## 2019-01-20 DIAGNOSIS — R339 Retention of urine, unspecified: Secondary | ICD-10-CM | POA: Diagnosis present

## 2019-01-20 LAB — URINALYSIS, ROUTINE W REFLEX MICROSCOPIC
Bacteria, UA: NONE SEEN
Bilirubin Urine: NEGATIVE
Glucose, UA: NEGATIVE mg/dL
Hgb urine dipstick: NEGATIVE
Ketones, ur: NEGATIVE mg/dL
Leukocytes,Ua: NEGATIVE
Nitrite: NEGATIVE
Protein, ur: 30 mg/dL — AB
Specific Gravity, Urine: 1.019 (ref 1.005–1.030)
pH: 5 (ref 5.0–8.0)

## 2019-01-20 NOTE — ED Triage Notes (Signed)
Patient reports that he has urinary retention at night, states that he is night emptying his bladder as he should. No distress

## 2019-02-09 ENCOUNTER — Inpatient Hospital Stay (HOSPITAL_COMMUNITY)
Admission: EM | Admit: 2019-02-09 | Discharge: 2019-02-12 | DRG: 176 | Disposition: A | Discharge status: Home / Self Care | Payer: 59 | Attending: Internal Medicine | Admitting: Internal Medicine

## 2019-02-09 ENCOUNTER — Other Ambulatory Visit: Payer: Self-pay

## 2019-02-09 ENCOUNTER — Emergency Department (HOSPITAL_COMMUNITY): Payer: 59

## 2019-02-09 ENCOUNTER — Encounter (HOSPITAL_COMMUNITY): Payer: Self-pay | Admitting: Emergency Medicine

## 2019-02-09 ENCOUNTER — Emergency Department (HOSPITAL_BASED_OUTPATIENT_CLINIC_OR_DEPARTMENT_OTHER): Payer: 59

## 2019-02-09 DIAGNOSIS — I82452 Acute embolism and thrombosis of left peroneal vein: Secondary | ICD-10-CM | POA: Diagnosis not present

## 2019-02-09 DIAGNOSIS — I10 Essential (primary) hypertension: Secondary | ICD-10-CM | POA: Diagnosis present

## 2019-02-09 DIAGNOSIS — Z20828 Contact with and (suspected) exposure to other viral communicable diseases: Secondary | ICD-10-CM | POA: Diagnosis not present

## 2019-02-09 DIAGNOSIS — I4891 Unspecified atrial fibrillation: Secondary | ICD-10-CM | POA: Diagnosis not present

## 2019-02-09 DIAGNOSIS — I82433 Acute embolism and thrombosis of popliteal vein, bilateral: Secondary | ICD-10-CM | POA: Diagnosis present

## 2019-02-09 DIAGNOSIS — R778 Other specified abnormalities of plasma proteins: Secondary | ICD-10-CM

## 2019-02-09 DIAGNOSIS — I959 Hypotension, unspecified: Secondary | ICD-10-CM | POA: Diagnosis not present

## 2019-02-09 DIAGNOSIS — R0602 Shortness of breath: Secondary | ICD-10-CM | POA: Diagnosis not present

## 2019-02-09 DIAGNOSIS — I2699 Other pulmonary embolism without acute cor pulmonale: Secondary | ICD-10-CM | POA: Diagnosis not present

## 2019-02-09 DIAGNOSIS — I493 Ventricular premature depolarization: Secondary | ICD-10-CM | POA: Diagnosis present

## 2019-02-09 DIAGNOSIS — M1990 Unspecified osteoarthritis, unspecified site: Secondary | ICD-10-CM | POA: Diagnosis present

## 2019-02-09 DIAGNOSIS — N400 Enlarged prostate without lower urinary tract symptoms: Secondary | ICD-10-CM | POA: Diagnosis present

## 2019-02-09 DIAGNOSIS — I82403 Acute embolism and thrombosis of unspecified deep veins of lower extremity, bilateral: Secondary | ICD-10-CM

## 2019-02-09 DIAGNOSIS — R7989 Other specified abnormal findings of blood chemistry: Secondary | ICD-10-CM | POA: Diagnosis present

## 2019-02-09 DIAGNOSIS — I361 Nonrheumatic tricuspid (valve) insufficiency: Secondary | ICD-10-CM | POA: Diagnosis not present

## 2019-02-09 DIAGNOSIS — I472 Ventricular tachycardia, unspecified: Secondary | ICD-10-CM

## 2019-02-09 DIAGNOSIS — Z8249 Family history of ischemic heart disease and other diseases of the circulatory system: Secondary | ICD-10-CM

## 2019-02-09 DIAGNOSIS — Z79899 Other long term (current) drug therapy: Secondary | ICD-10-CM

## 2019-02-09 DIAGNOSIS — Z9079 Acquired absence of other genital organ(s): Secondary | ICD-10-CM

## 2019-02-09 LAB — COMPREHENSIVE METABOLIC PANEL
ALT: 39 U/L (ref 0–44)
AST: 35 U/L (ref 15–41)
Albumin: 3.7 g/dL (ref 3.5–5.0)
Alkaline Phosphatase: 53 U/L (ref 38–126)
Anion gap: 12 (ref 5–15)
BUN: 18 mg/dL (ref 8–23)
CO2: 24 mmol/L (ref 22–32)
Calcium: 9.2 mg/dL (ref 8.9–10.3)
Chloride: 104 mmol/L (ref 98–111)
Creatinine, Ser: 1 mg/dL (ref 0.61–1.24)
GFR calc Af Amer: >60 mL/min (ref >60)
GFR calc non Af Amer: >60 mL/min (ref >60)
Glucose, Bld: 140 mg/dL — ABNORMAL HIGH (ref 70–99)
Potassium: 3.8 mmol/L (ref 3.5–5.1)
Sodium: 140 mmol/L (ref 135–145)
Total Bilirubin: 1.1 mg/dL (ref 0.3–1.2)
Total Protein: 7.1 g/dL (ref 6.5–8.1)

## 2019-02-09 LAB — TROPONIN I (HIGH SENSITIVITY)
Troponin I (High Sensitivity): 34 ng/L — ABNORMAL HIGH (ref <18)
Troponin I (High Sensitivity): 42 ng/L — ABNORMAL HIGH (ref <18)

## 2019-02-09 LAB — LACTIC ACID, PLASMA
Lactic Acid, Venous: 1.9 mmol/L (ref 0.5–1.9)
Lactic Acid, Venous: 1.4 mmol/L (ref 0.5–1.9)

## 2019-02-09 LAB — I-STAT CHEM 8, ED
BUN: 21 mg/dL (ref 8–23)
Calcium, Ion: 1.14 mmol/L — ABNORMAL LOW (ref 1.15–1.40)
Chloride: 104 mmol/L (ref 98–111)
Creatinine, Ser: 0.8 mg/dL (ref 0.61–1.24)
Glucose, Bld: 138 mg/dL — ABNORMAL HIGH (ref 70–99)
HCT: 43 % (ref 39.0–52.0)
Hemoglobin: 14.6 g/dL (ref 13.0–17.0)
Potassium: 3.6 mmol/L (ref 3.5–5.1)
Sodium: 141 mmol/L (ref 135–145)
TCO2: 25 mmol/L (ref 22–32)

## 2019-02-09 LAB — URINALYSIS, ROUTINE W REFLEX MICROSCOPIC
Bilirubin Urine: NEGATIVE
Glucose, UA: NEGATIVE mg/dL
Ketones, ur: NEGATIVE mg/dL
Leukocytes,Ua: NEGATIVE
Nitrite: NEGATIVE
Protein, ur: 30 mg/dL — AB
Specific Gravity, Urine: 1.018 (ref 1.005–1.030)
pH: 5 (ref 5.0–8.0)

## 2019-02-09 LAB — CBC WITH DIFFERENTIAL/PLATELET
Abs Immature Granulocytes: 0.02 10*3/uL (ref 0.00–0.07)
Basophils Absolute: 0 10*3/uL (ref 0.0–0.1)
Basophils Relative: 0 %
Eosinophils Absolute: 0 10*3/uL (ref 0.0–0.5)
Eosinophils Relative: 0 %
HCT: 42.9 % (ref 39.0–52.0)
Hemoglobin: 13.7 g/dL (ref 13.0–17.0)
Immature Granulocytes: 0 %
Lymphocytes Relative: 15 %
Lymphs Abs: 1.2 10*3/uL (ref 0.7–4.0)
MCH: 28.8 pg (ref 26.0–34.0)
MCHC: 31.9 g/dL (ref 30.0–36.0)
MCV: 90.3 fL (ref 80.0–100.0)
Monocytes Absolute: 0.4 10*3/uL (ref 0.1–1.0)
Monocytes Relative: 5 %
Neutro Abs: 6.7 10*3/uL (ref 1.7–7.7)
Neutrophils Relative %: 80 %
Platelets: 193 10*3/uL (ref 150–400)
RBC: 4.75 MIL/uL (ref 4.22–5.81)
RDW: 13.2 % (ref 11.5–15.5)
WBC: 8.4 10*3/uL (ref 4.0–10.5)
nRBC: 0 % (ref 0.0–0.2)

## 2019-02-09 LAB — ECHOCARDIOGRAM COMPLETE
Height: 65 in
Weight: 2560 oz

## 2019-02-09 LAB — BRAIN NATRIURETIC PEPTIDE: B Natriuretic Peptide: 400.8 pg/mL — ABNORMAL HIGH (ref 0.0–100.0)

## 2019-02-09 LAB — FERRITIN: Ferritin: 76 ng/mL (ref 24–336)

## 2019-02-09 LAB — TRIGLYCERIDES: Triglycerides: 39 mg/dL (ref <150)

## 2019-02-09 LAB — SARS CORONAVIRUS 2 BY RT PCR (HOSPITAL ORDER, PERFORMED IN ~~LOC~~ HOSPITAL LAB): SARS Coronavirus 2: NEGATIVE

## 2019-02-09 LAB — LACTATE DEHYDROGENASE: LDH: 149 U/L (ref 98–192)

## 2019-02-09 LAB — D-DIMER, QUANTITATIVE: D-Dimer, Quant: 2.81 ug/mL-FEU — ABNORMAL HIGH (ref 0.00–0.50)

## 2019-02-09 LAB — FIBRINOGEN: Fibrinogen: 405 mg/dL (ref 210–475)

## 2019-02-09 LAB — MAGNESIUM: Magnesium: 2 mg/dL (ref 1.7–2.4)

## 2019-02-09 LAB — C-REACTIVE PROTEIN: CRP: <0.8 mg/dL (ref <1.0)

## 2019-02-09 LAB — PROCALCITONIN: Procalcitonin: <0.1 ng/mL

## 2019-02-09 MED ORDER — METOPROLOL TARTRATE 25 MG PO TABS
25.0000 mg | ORAL_TABLET | Freq: Two times a day (BID) | ORAL | Status: DC
  Administered 2019-02-10: 25 mg via ORAL
  Filled 2019-02-09 (×2): qty 1

## 2019-02-09 MED ORDER — AMIODARONE LOAD VIA INFUSION
150.0000 mg | Freq: Once | INTRAVENOUS | Status: AC
  Administered 2019-02-09: 150 mg via INTRAVENOUS
  Filled 2019-02-09: qty 83.34

## 2019-02-09 MED ORDER — AMIODARONE HCL IN DEXTROSE 360-4.14 MG/200ML-% IV SOLN
30.0000 mg/h | INTRAVENOUS | Status: DC
  Administered 2019-02-09: 30 mg/h via INTRAVENOUS
  Filled 2019-02-09: qty 200

## 2019-02-09 MED ORDER — HEPARIN SODIUM (PORCINE) 5000 UNIT/ML IJ SOLN: 4000.0000 [IU] | Freq: Once | INTRAMUSCULAR | Status: DC

## 2019-02-09 MED ORDER — HEPARIN (PORCINE) 25000 UT/250ML-% IV SOLN
1100.0000 [IU]/h | INTRAVENOUS | Status: AC
  Administered 2019-02-09 – 2019-02-10 (×2): 1200 [IU]/h via INTRAVENOUS
  Administered 2019-02-11: 1100 [IU]/h via INTRAVENOUS
  Filled 2019-02-09 (×3): qty 250

## 2019-02-09 MED ORDER — HYDROCHLOROTHIAZIDE 25 MG PO TABS: 25.0000 mg | ORAL_TABLET | Freq: Every day | ORAL | Status: DC

## 2019-02-09 MED ORDER — ISOSORBIDE MONONITRATE ER 60 MG PO TB24
60.0000 mg | ORAL_TABLET | Freq: Every day | ORAL | Status: DC
  Administered 2019-02-09 – 2019-02-11 (×3): 60 mg via ORAL
  Filled 2019-02-09 (×3): qty 1

## 2019-02-09 MED ORDER — POTASSIUM CHLORIDE CRYS ER 20 MEQ PO TBCR
40.0000 meq | EXTENDED_RELEASE_TABLET | Freq: Once | ORAL | Status: AC
  Administered 2019-02-09: 40 meq via ORAL
  Filled 2019-02-09: qty 2

## 2019-02-09 MED ORDER — TAMSULOSIN HCL 0.4 MG PO CAPS
0.4000 mg | ORAL_CAPSULE | Freq: Every day | ORAL | Status: DC
  Administered 2019-02-09 – 2019-02-12 (×4): 0.4 mg via ORAL
  Filled 2019-02-09 (×4): qty 1

## 2019-02-09 MED ORDER — AMIODARONE HCL IN DEXTROSE 360-4.14 MG/200ML-% IV SOLN
60.0000 mg/h | INTRAVENOUS | Status: DC
  Administered 2019-02-09 (×2): 60 mg/h via INTRAVENOUS

## 2019-02-09 MED ORDER — LISINOPRIL 40 MG PO TABS
40.0000 mg | ORAL_TABLET | Freq: Every day | ORAL | Status: DC
  Administered 2019-02-10 – 2019-02-11 (×2): 40 mg via ORAL
  Filled 2019-02-09 (×2): qty 1

## 2019-02-09 MED ORDER — HEPARIN BOLUS VIA INFUSION
5000.0000 [IU] | Freq: Once | INTRAVENOUS | Status: AC
  Administered 2019-02-09: 5000 [IU] via INTRAVENOUS
  Filled 2019-02-09: qty 5000

## 2019-02-09 MED ORDER — AMLODIPINE BESYLATE 10 MG PO TABS
10.0000 mg | ORAL_TABLET | Freq: Every day | ORAL | Status: DC
  Administered 2019-02-10 – 2019-02-11 (×2): 10 mg via ORAL
  Filled 2019-02-09 (×2): qty 1

## 2019-02-09 MED ORDER — AMIODARONE HCL 200 MG PO TABS: 200.0000 mg | ORAL_TABLET | Freq: Every day | ORAL | Status: DC

## 2019-02-09 MED ORDER — IOHEXOL 350 MG/ML SOLN
75.0000 mL | Freq: Once | INTRAVENOUS | Status: AC | PRN
  Administered 2019-02-09: 75 mL via INTRAVENOUS

## 2019-02-09 MED ORDER — ALBUTEROL SULFATE HFA 108 (90 BASE) MCG/ACT IN AERS: 2.0000 | INHALATION_SPRAY | Freq: Once | RESPIRATORY_TRACT | Status: DC

## 2019-02-09 MED ORDER — AMIODARONE HCL 200 MG PO TABS
200.0000 mg | ORAL_TABLET | Freq: Two times a day (BID) | ORAL | Status: DC
  Administered 2019-02-10: 200 mg via ORAL
  Filled 2019-02-09: qty 1

## 2019-02-09 MED ORDER — LACTULOSE 10 GM/15ML PO SOLN: 10.0000 g | Freq: Two times a day (BID) | ORAL | Status: DC | PRN

## 2019-02-09 MED ORDER — HEPARIN (PORCINE) 25000 UT/250ML-% IV SOLN: 12.0000 [IU]/kg/h | INTRAVENOUS | Status: DC

## 2019-02-09 NOTE — Progress Notes (Signed)
ANTICOAGULATION CONSULT NOTE   Pharmacy Consult for Heparin Indication: pulmonary embolus  No Known Allergies  Patient Measurements: Height: 5\' 5"  (165.1 cm) Weight: 160 lb (72.6 kg) IBW/kg (Calculated) : 61.5 Heparin Dosing Weight: 72.6kg  Vital Signs: Temp: 98.3 F (36.8 C) (10/27 0925) Temp Source: Oral (10/27 0925) BP: 172/66 (10/27 1645) Pulse Rate: 66 (10/27 1645)  Labs: Recent Labs    02/09/19 0950 02/09/19 1009 02/09/19 1040 02/09/19 1151  HGB 13.7  --  14.6  --   HCT 42.9  --  43.0  --   PLT 193  --   --   --   CREATININE 1.00  --  0.80  --   TROPONINIHS  --  34*  --  42*    Estimated Creatinine Clearance: 53.4 mL/min (by C-G formula based on SCr of 0.8 mg/dL).   Medical History: Past Medical History:  Diagnosis Date  . Arthritis    fingers and hands  . BPH (benign prostatic hypertrophy)    problems with acute urinary retention- has indwelling foley catheter  . Cold (disease)    last week ( week of Dec 22- Dec 29 ) - no longer coughing, no fever and feels better now  . Enlarged prostate   . Hypertension     Medications:  Scheduled:  . [START ON 02/10/2019] amLODipine  10 mg Oral Daily  . heparin  5,000 Units Intravenous Once  . [START ON 02/10/2019] hydrochlorothiazide  25 mg Oral Daily  . [START ON 02/10/2019] lisinopril  40 mg Oral Daily  . metoprolol tartrate  25 mg Oral BID    Assessment:  Patient is a 38 yom that presented with shortness of breath. While in the ED the patient had intermittent episodes of V-tach. Patient was also found to have a bilateral PE.  Goal of Therapy:  Heparin level 0.3-0.7 units/ml Monitor platelets by anticoagulation protocol: Yes   Plan:  - Will give the patient a bolus of Heparin 5000 units.  - Followed by Heparin 1200 units/hr - Will obtain a heparin level in 8 hours - Will monitor patient for s/s of bleeding and cbc daily   Duanne Limerick PharmD. BCPS  02/09/2019,5:22 PM

## 2019-02-09 NOTE — ED Notes (Signed)
Pt back from CT

## 2019-02-09 NOTE — ED Notes (Signed)
ED TO INPATIENT HANDOFF REPORT  ED Nurse Name and Phone #:   S Name/Age/Gender Jesus Morrison 85 y.o. male Room/Bed: 011C/011C  Code Status   Code Status: Not on file  Home/SNF/Other Skilled nursing facility Patient oriented to: self Is this baseline? Yes   Triage Complete: Triage complete  Chief Complaint sx of covid/  Triage Note Pt reports generalized body aches with runny nose, productive cough with white mucous, sore throat and headache for the past week. Pt also reports sob at night.    Allergies No Known Allergies  Level of Care/Admitting Diagnosis ED Disposition    ED Disposition Condition [facility] Area: [place] [[id number]]  Level of Care: Progressive [102]  Admit to Progressive based on following criteria: CARDIOVASCULAR & THORACIC of moderate stability with acute coronary syndrome symptoms/low risk myocardial infarction/hypertensive urgency/arrhythmias/heart failure potentially compromising stability and stable post cardiovascular intervention patients.  Covid Evaluation: Asymptomatic Screening Protocol (No Symptoms)  Diagnosis: Bilateral pulmonary embolism [facility]) [[id number]]  Admitting Physician: Hosie Poisson [[id number]]  Attending Physician: Hosie Poisson [[id number]]  PT Class (Do Not Modify): Observation [104]  PT Acc Code (Do Not Modify): Observation [10022]       B Medical/Surgery History Past Medical History:  Diagnosis Date  . Arthritis    fingers and hands  . BPH (benign prostatic hypertrophy)    problems with acute urinary retention- has indwelling foley catheter  . Cold (disease)    last week ( week of Dec 22- Dec 29 ) - no longer coughing, no fever and feels better now  . Enlarged prostate   . Hypertension    Past Surgical History:  Procedure Laterality Date  .  Right elbow surgery as a child    . PROSTATECTOMY  04/17/2012   Procedure: PROSTATECTOMY RETROPUBIC;  Surgeon: Hanley Ben, MD;  Location: [facility];   Service: Urology;  Laterality: N/A;  Simple Retropubic Prostatectomy       A IV Location/Drains/Wounds Patient Lines/Drains/Airways Status   Active Line/Drains/Airways    Name:   Placement date:   Placement time:   Site:   Days:   Peripheral IV 02/09/19 Anterior;Distal;Left;Upper Arm   02/09/19    1017    Arm   less than 1   Peripheral IV 02/09/19 Left;Posterior Hand   02/09/19    1018    Hand   less than 1   Peripheral IV 02/09/19 Right Antecubital   02/09/19    1018    Antecubital   less than 1   Incision 04/17/12 Abdomen   04/17/12    1215     2489   Incision 04/17/12 Perineum   04/17/12    1215     2489          Intake/Output Last 24 hours No intake or output data in the 24 hours ending 02/09/19 1941  Labs/Imaging Results for orders placed or performed during the hospital encounter of 02/09/19 (from the past 48 hour(s))  CBC WITH DIFFERENTIAL     Status: None   Collection Time: 02/09/19  9:50 AM  Result Value Ref Range   WBC 8.4 4.0 - 10.5 K/uL   RBC 4.75 4.22 - 5.81 MIL/uL   Hemoglobin 13.7 13.0 - 17.0 g/dL   HCT 42.9 39.0 - 52.0 %   MCV 90.3 80.0 - 100.0 fL   MCH 28.8 26.0 - 34.0 pg   MCHC 31.9 30.0 - 36.0 g/dL   RDW 13.2 11.5 - 15.5 %  Platelets 193 150 - 400 K/uL   nRBC 0.0 0.0 - 0.2 %   Neutrophils Relative % 80 %   Neutro Abs 6.7 1.7 - 7.7 K/uL   Lymphocytes Relative 15 %   Lymphs Abs 1.2 0.7 - 4.0 K/uL   Monocytes Relative 5 %   Monocytes Absolute 0.4 0.1 - 1.0 K/uL   Eosinophils Relative 0 %   Eosinophils Absolute 0.0 0.0 - 0.5 K/uL   Basophils Relative 0 %   Basophils Absolute 0.0 0.0 - 0.1 K/uL   Immature Granulocytes 0 %   Abs Immature Granulocytes 0.02 0.00 - 0.07 K/uL    Comment: Performed at [facility], [address] [place], [place] [address]  Comprehensive metabolic panel     Status: Abnormal   Collection Time: 02/09/19  9:50 AM  Result Value Ref Range   Sodium 140 135 - 145 mmol/L   Potassium 3.8 3.5 - 5.1 mmol/L   Chloride 104  98 - 111 mmol/L   CO2 24 22 - 32 mmol/L   Glucose, Bld 140 (H) 70 - 99 mg/dL   BUN 18 8 - 23 mg/dL   Creatinine, Ser 6.04 0.61 - 1.24 mg/dL   Calcium 9.2 8.9 - 54.0 mg/dL   Total Protein 7.1 6.5 - 8.1 g/dL   Albumin 3.7 3.5 - 5.0 g/dL   AST 35 15 - 41 U/L   ALT 39 0 - 44 U/L   Alkaline Phosphatase 53 38 - 126 U/L   Total Bilirubin 1.1 0.3 - 1.2 mg/dL   GFR calc non Af Amer >60 >60 mL/min   GFR calc Af Amer >60 >60 mL/min   Anion gap 12 5 - 15    Comment: Performed at [facility], [address] [place], [place] [address]  D-dimer, quantitative     Status: Abnormal   Collection Time: 02/09/19  9:50 AM  Result Value Ref Range   D-Dimer, Quant 2.81 (H) 0.00 - 0.50 ug/mL-FEU    Comment: (NOTE) At the manufacturer cut-off of 0.50 ug/mL FEU, this assay has been documented to exclude PE with a sensitivity and negative predictive value of 97 to 99%.  At this time, this assay has not been approved by the FDA to exclude DVT/VTE. Results should be correlated with clinical presentation. Performed at [facility], [address], [place], [place] [address]   Procalcitonin     Status: None   Collection Time: 02/09/19  9:50 AM  Result Value Ref Range   Procalcitonin <0.10 ng/mL    Comment:        Interpretation: PCT (Procalcitonin) <= 0.5 ng/mL: Systemic infection (sepsis) is not likely. Local bacterial infection is possible. (NOTE)       Sepsis PCT Algorithm           Lower Respiratory Tract                                      Infection PCT Algorithm    ----------------------------     ----------------------------         PCT < 0.25 ng/mL                PCT < 0.10 ng/mL         Strongly encourage             Strongly discourage   discontinuation of antibiotics    initiation  of antibiotics    ----------------------------     -----------------------------       PCT 0.25 - 0.50 ng/mL            PCT 0.10 - 0.25 ng/mL               OR       >80% decrease in PCT             Discourage initiation of                                            antibiotics      Encourage discontinuation           of antibiotics    ----------------------------     -----------------------------         PCT >= 0.50 ng/mL              PCT 0.26 - 0.50 ng/mL               AND        <80% decrease in PCT             Encourage initiation of                                             antibiotics       Encourage continuation           of antibiotics    ----------------------------     -----------------------------        PCT >= 0.50 ng/mL                  PCT > 0.50 ng/mL               AND         increase in PCT                  Strongly encourage                                      initiation of antibiotics    Strongly encourage escalation           of antibiotics                                     -----------------------------                                           PCT <= 0.25 ng/mL                                                 OR                                        >  80% decrease in PCT                                     Discontinue / Do not initiate                                             antibiotics Performed at [facility], [address], [place], [place] [address]   Lactate dehydrogenase     Status: None   Collection Time: 02/09/19  9:50 AM  Result Value Ref Range   LDH 149 98 - 192 U/L    Comment: Performed at [facility], [address], [place], [place] [address]  Ferritin     Status: None   Collection Time: 02/09/19  9:50 AM  Result Value Ref Range   Ferritin 76 24 - 336 ng/mL    Comment: Performed at [facility], [address], [place], [place] [address]  Fibrinogen     Status: None   Collection Time: 02/09/19  9:50 AM  Result Value Ref Range   Fibrinogen 405 210 - 475 mg/dL    Comment: Performed at [facility], [address] [place], [place] [address]  C-reactive protein     Status: None   Collection Time:  02/09/19  9:50 AM  Result Value Ref Range   CRP <0.8 <1.0 mg/dL    Comment: Performed at [facility], [address] [place], [place] [address]  Brain natriuretic peptide     Status: Abnormal   Collection Time: 02/09/19  9:56 AM  Result Value Ref Range   B Natriuretic Peptide 400.8 (H) 0.0 - 100.0 pg/mL    Comment: Performed at [facility], [address] [place], [place] [address]  Troponin I (High Sensitivity)     Status: Abnormal   Collection Time: 02/09/19 10:09 AM  Result Value Ref Range   Troponin I (High Sensitivity) 34 (H) <18 ng/L    Comment: (NOTE) Elevated high sensitivity troponin I (hsTnI) values and significant  changes across serial measurements may suggest ACS but many other  chronic and acute conditions are known to elevate hsTnI results.  Refer to the "Links" section for chest pain algorithms and additional  guidance. Performed at [facility], [address], [place], [place] [address]   Magnesium     Status: None   Collection Time: 02/09/19 10:09 AM  Result Value Ref Range   Magnesium 2.0 1.7 - 2.4 mg/dL    Comment: Performed at [facility], [address] [place], [place] [address]  Triglycerides     Status: None   Collection Time: 02/09/19 10:09 AM  Result Value Ref Range   Triglycerides 39 <150 mg/dL    Comment: Performed at [facility], [address] [place], [place] [address]  SARS Coronavirus 2 by RT PCR (hospital order, performed in [facility] hospital lab) Nasopharyngeal Nasopharyngeal Swab     Status: None   Collection Time: 02/09/19 10:14 AM   Specimen: Nasopharyngeal Swab  Result Value Ref Range   SARS Coronavirus 2 NEGATIVE NEGATIVE    Comment: (NOTE) If result is NEGATIVE SARS-CoV-2 target nucleic acids are NOT DETECTED. The SARS-CoV-2 RNA is generally detectable in upper and lower  respiratory specimens during the acute phase of infection. The lowest  concentration of SARS-CoV-2 viral copies this assay  can detect is 250  copies / mL. A negative result does not preclude SARS-CoV-2 infection  and should not be used as the sole basis for treatment or other  patient management decisions.  A negative result may occur with  improper specimen collection / handling, submission of specimen other  than nasopharyngeal swab, presence of viral mutation(s) within the  areas targeted by this assay, and inadequate number of viral copies  (<250 copies / mL). A negative result must be combined with clinical  observations, patient history, and epidemiological information. If result is POSITIVE SARS-CoV-2 target nucleic acids are DETECTED. The SARS-CoV-2 RNA is generally detectable in upper and lower  respiratory specimens dur ing the acute phase of infection.  Positive  results are indicative of active infection with SARS-CoV-2.  Clinical  correlation with patient history and other diagnostic information is  necessary to determine patient infection status.  Positive results do  not rule out bacterial infection or co-infection with other viruses. If result is PRESUMPTIVE POSTIVE SARS-CoV-2 nucleic acids MAY BE PRESENT.   A presumptive positive result was obtained on the submitted specimen  and confirmed on repeat testing.  While 2019 novel coronavirus  (SARS-CoV-2) nucleic acids may be present in the submitted sample  additional confirmatory testing may be necessary for epidemiological  and / or clinical management purposes  to differentiate between  SARS-CoV-2 and other Sarbecovirus currently known to infect humans.  If clinically indicated additional testing with an alternate test  methodology [id number]) is advised. The SARS-CoV-2 RNA is generally  detectable in upper and lower respiratory sp ecimens during the acute  phase of infection. The expected result is Negative. Fact Sheet for Patients:  BoilerBrush.com.cyhttps://www.fda.gov/media/136312/download Fact Sheet for Healthcare  Providers: https://pope.com/https://www.fda.gov/media/136313/download This test is not yet approved or cleared by the [place] FDA and has been authorized for detection and/or diagnosis of SARS-CoV-2 by FDA under an Emergency Use Authorization (EUA).  This EUA will remain in effect (meaning this test can be used) for the duration of the COVID-19 declaration under Section 564(b)(1) of the Act, 21 U.S.C. section 360bbb-3(b)(1), unless the authorization is terminated or revoked sooner. Performed at [facility], [address] [place], [place] [address]   Lactic acid, plasma     Status: None   Collection Time: 02/09/19 10:25 AM  Result Value Ref Range   Lactic Acid, Venous 1.9 0.5 - 1.9 mmol/L    Comment: Performed at [facility], [address] [place], [place] [address]  Urinalysis, Routine w reflex microscopic     Status: Abnormal   Collection Time: 02/09/19 10:25 AM  Result Value Ref Range   Color, Urine YELLOW YELLOW   APPearance CLEAR CLEAR   Specific Gravity, Urine 1.018 1.005 - 1.030   pH 5.0 5.0 - 8.0   Glucose, UA NEGATIVE NEGATIVE mg/dL   Hgb urine dipstick SMALL (A) NEGATIVE   Bilirubin Urine NEGATIVE NEGATIVE   Ketones, ur NEGATIVE NEGATIVE mg/dL   Protein, ur 30 (A) NEGATIVE mg/dL   Nitrite NEGATIVE NEGATIVE   Leukocytes,Ua NEGATIVE NEGATIVE   RBC / HPF 0-5 0 - 5 RBC/hpf   WBC, UA 0-5 0 - 5 WBC/hpf   Bacteria, UA RARE (A) NONE SEEN   Squamous Epithelial / LPF 0-5 0 - 5   Mucus PRESENT    Hyaline Casts, UA PRESENT     Comment: Performed at  [facility], [address], [place], [place] [address]  I-stat chem 8, ED (not at [facility] or [facility])     Status: Abnormal   Collection Time: 02/09/19 10:40 AM  Result Value Ref Range   Sodium 141 135 - 145 mmol/L   Potassium 3.6 3.5 - 5.1 mmol/L   Chloride 104 98 - 111 mmol/L   BUN 21 8 - 23 mg/dL   Creatinine, Ser 3.76 0.61 - 1.24 mg/dL   Glucose, Bld 283 (H) 70 - 99 mg/dL   Calcium, Ion 1.51 (L) 1.15 - 1.40 mmol/L    TCO2 25 22 - 32 mmol/L   Hemoglobin 14.6 13.0 - 17.0 g/dL   HCT 76.1 60.7 - 37.1 %  Lactic acid, plasma     Status: None   Collection Time: 02/09/19 11:51 AM  Result Value Ref Range   Lactic Acid, Venous 1.4 0.5 - 1.9 mmol/L    Comment: Performed at [facility], [address] [place], [place] [address]  Troponin I (High Sensitivity)     Status: Abnormal   Collection Time: 02/09/19 11:51 AM  Result Value Ref Range   Troponin I (High Sensitivity) 42 (H) <18 ng/L    Comment: (NOTE) Elevated high sensitivity troponin I (hsTnI) values and significant  changes across serial measurements may suggest ACS but many other  chronic and acute conditions are known to elevate hsTnI results.  Refer to the "Links" section for chest pain algorithms and additional  guidance. Performed at [facility], [address] [place], [place] [address]    Ct Angio Chest Pe W And/or Wo Contrast  Result Date: 02/09/2019 CLINICAL DATA:  Headache, sore throat, body aches, and productive cough for the past week. EXAM: CT ANGIOGRAPHY CHEST WITH CONTRAST TECHNIQUE: Multidetector CT imaging of the chest was performed using the standard protocol during bolus administration of intravenous contrast. Multiplanar CT image reconstructions and MIPs were obtained to evaluate the vascular anatomy. CONTRAST:  49mL OMNIPAQUE IOHEXOL 350 MG/ML SOLN COMPARISON:  Chest x-ray from same day. FINDINGS: Cardiovascular: Satisfactory opacification of the pulmonary arteries to the segmental level. Acute lobar and segmental pulmonary emboli primarily involving the left upper lobe with minimal clot extending into the proximal left interlobar pulmonary artery and left lower lobe superior segmental pulmonary artery. Additional segmental and subsegmental pulmonary emboli in the right upper lobe anterior segment. Overall clot burden is small. Mild cardiomegaly with normal RV/LV ratio. Trace pericardial effusion. Aneurysmal ascending  thoracic aorta measuring up to 4.1 cm. Coronary, aortic arch, and branch vessel atherosclerotic vascular disease. Mediastinum/Nodes: No enlarged mediastinal, hilar, or axillary lymph nodes. Mildly patulous esophagus. Thyroid gland and trachea demonstrate no significant findings. Lungs/Pleura: Minimal dependent subsegmental atelectasis in both lower lobes. Mild diffuse central peribronchial thickening. No focal consolidation, pleural effusion, or pneumothorax. No suspicious pulmonary nodule. Upper Abdomen: No acute abnormality. Musculoskeletal: No chest wall abnormality. No acute or significant osseous findings. Review of the MIP images confirms the above findings. IMPRESSION: 1. Acute bilateral pulmonary emboli primarily involving both upper lobes. Overall clot burden is small. No right heart strain. 2. Ascending thoracic aortic aneurysm measuring up to 4.1 cm. Recommend annual imaging followup by CTA or MRA. This recommendation follows 2010 ACCF/AHA/AATS/ACR/ASA/SCA/SCAI/SIR/STS/SVM Guidelines for the Diagnosis and Management of Patients with Thoracic Aortic Disease. Circulation. 2010; 121: E703-J009. Aortic aneurysm NOS (ICD10-I71.9) 3.  Aortic atherosclerosis (ICD10-I70.0). Critical Value/emergent results were called by telephone at the time of interpretation on 02/09/2019 at 5:06 pm to [facility],  who verbally acknowledged these results. Electronically Signed   By: Obie Dredge M.D.   On: 02/09/2019 17:10   Dg Chest Port 1 View  Result Date: 02/09/2019 CLINICAL DATA:  Shortness of breath EXAM: PORTABLE CHEST 1 VIEW COMPARISON:  08/23/2011 FINDINGS: Cardiomegaly. No confluent airspace opacities, effusions or edema. No acute bony abnormality. IMPRESSION: No active disease. Electronically Signed   By: Charlett Nose M.D.   On: 02/09/2019 12:07    Pending Labs Unresulted Labs (From admission, onward)    Start     Ordered   02/11/19 0500  Heparin level (unfractionated)  Daily,   R      02/09/19 1721   02/10/19 0500  Basic metabolic panel  Tomorrow morning,   R     02/09/19 1604   02/10/19 0500  CBC  Daily,   R     02/09/19 1721   02/10/19 0200  Heparin level (unfractionated)  Once-Timed,   STAT     02/09/19 1721   02/09/19 0957  Urine culture  ONCE - STAT,   STAT     02/09/19 0956   02/09/19 0950  Blood Culture (routine x 2)  BLOOD CULTURE X 2,   STAT     02/09/19 0949          Vitals/Pain Today's Vitals   02/09/19 1645 02/09/19 1715 02/09/19 1815 02/09/19 1845  BP: (!) 172/66 139/71 (!) 144/84 (!) 154/90  Pulse: 66  61 73  Resp: Temp:      TempSrc:      SpO2: 100% 98% 91% (!) 89%  Weight:      Height:      PainSc:        Isolation Precautions No active isolations  Medications Medications  amiodarone (NEXTERONE) 1.8 mg/mL load via infusion 150 mg (150 mg Intravenous Bolus from Bag 02/09/19 1013)    Followed by  amiodarone (NEXTERONE PREMIX) 360-4.14 MG/200ML-% (1.8 mg/mL) IV infusion (0 mg/hr Intravenous Stopped 02/09/19 1602)    Followed by  amiodarone (NEXTERONE PREMIX) 360-4.14 MG/200ML-% (1.8 mg/mL) IV infusion (30 mg/hr Intravenous New Bag/Given 02/09/19 1602)  amLODipine (NORVASC) tablet 10 mg (has no administration in time range)  metoprolol tartrate (LOPRESSOR) tablet 25 mg (has no administration in time range)  lisinopril (ZESTRIL) tablet 40 mg (has no administration in time range)  hydrochlorothiazide (HYDRODIURIL) tablet 25 mg (has no administration in time range)  heparin ADULT infusion 100 units/mL (25000 units/231mL sodium chloride 0.45%) (1,200 Units/hr Intravenous New Bag/Given 02/09/19 1739)  potassium chloride SA (KLOR-CON) CR tablet 40 mEq (40 mEq Oral Given 02/09/19 1530)  iohexol (OMNIPAQUE) 350 MG/ML injection 75 mL (75 mLs Intravenous Contrast Given 02/09/19 1623)  heparin bolus via infusion 5,000 Units (5,000 Units Intravenous Bolus from Bag 02/09/19 1740)    Mobility non-ambulatory Low fall risk   Focused  Assessments Pulmonary Assessment Handoff:  Lung sounds:   O2 Device: Room Air        R Recommendations: See Admitting Provider Note  Report given to:   Additional Notes:

## 2019-02-09 NOTE — H&P (Signed)
History and Physical    Jesus Morrison OZY:[id number] DOB: December 14, 1926 DOA: 02/09/2019  PCP: Hoy Register, MD  Patient coming from: Home   I have personally briefly reviewed patient's old medical records in [facility]  Chief Complaint: sob and sore throat.  HPI: Jesus Morrison is a 85 y.o. male with medical history significant of BPH, and hypertension, presents to ED fro sob and cough. On arrival to ED, he was found to be afebrile and his COVID 19 screening test is negative. He denies fever, chills, nausea, vomiting, abdominal pain and diarrhea. He denies any chest pain, palpitations, headache, dizziness. In ED he was found to be in new onset atrial fibrillation with RVR, and later on ventricular tachycardia. Cardiology consulted and he was started on IV amiodarone with better rates. Lab work revealed calcium of 1.14, BNP of 400, d dimer of 2.81, cbc is wnl, UA IS negative for infection. CT angiogram of the chest shows bilateral small pulmonary embolism in the upper lobes without any right heart strain. He was started on IV heparin and referred to medical service for admission.     Review of Systems: As per HPI otherwise All others reviewed and are negative.  Past Medical History:  Diagnosis Date   Arthritis    fingers and hands   BPH (benign prostatic hypertrophy)    problems with acute urinary retention- has indwelling foley catheter   Cold (disease)    last week ( week of Dec 22- Dec 29 ) - no longer coughing, no fever and feels better now   Enlarged prostate    Hypertension     Past Surgical History:  Procedure Laterality Date    Right elbow surgery as a child     PROSTATECTOMY  04/17/2012   Procedure: PROSTATECTOMY RETROPUBIC;  Surgeon: Lindaann Slough, MD;  Location: [facility];  Service: Urology;  Laterality: N/A;  Simple Retropubic Prostatectomy     Social history:  reports that he has never smoked. He has never used smokeless tobacco. He reports current alcohol use  of about 4.0 - 6.0 standard drinks of alcohol per week. He reports that he does not use drugs.  No Known Allergies  Family History  Problem Relation Age of Onset   Liver disease Father    Heart disease Brother   Family history reviewed and not pertinent   Prior to Admission medications   Medication Sig Start Date End Date Taking? Authorizing Provider  amLODipine (NORVASC) 10 MG tablet Take 1 tablet (10 mg total) by mouth daily. 11/23/18   Hoy Register, MD  cetirizine (ZYRTEC) 10 MG tablet Take 1 tablet (10 mg total) by mouth daily. 02/12/18   Hoy Register, MD  hydrocortisone cream 0.5 % Apply 1 application topically 2 (two) times daily. 05/16/17   Hoy Register, MD  isosorbide mononitrate (IMDUR) 60 MG 24 hr tablet Take 1 tablet (60 mg total) by mouth daily. 11/23/18   Hoy Register, MD  lactulose (CHRONULAC) 10 GM/15ML solution Take 15 mLs (10 g total) by mouth 2 (two) times daily as needed for mild constipation. 11/23/18   Hoy Register, MD  lisinopril-hydrochlorothiazide (ZESTORETIC) 20-12.5 MG tablet Take 2 tablets by mouth daily. 11/23/18   Hoy Register, MD  Misc. Devices MISC Blood Pressure monitor Dx: Hypertension 08/19/18   Hoy Register, MD  polyethylene glycol (MIRALAX / GLYCOLAX) packet Take 17 g by mouth daily. 09/19/17   Khatri, Hina, PA-C  tamsulosin (FLOMAX) 0.4 MG CAPS capsule Take 1 capsule (0.4 mg total) by mouth  daily. 11/23/18   Hoy RegisterNewlin, Enobong, MD  triamcinolone cream (KENALOG) 0.1 % Apply 1 application topically 2 (two) times daily. 05/20/18   Hoy RegisterNewlin, Enobong, MD    Physical Exam: Vitals:   02/09/19 1415 02/09/19 1515 02/09/19 1645 02/09/19 1715  BP: (!) 149/91 (!) 158/90 (!) 172/66 139/71  Pulse: (!) 55  66   Resp: 14  11   Temp:      TempSrc:      SpO2: 100% 100% 100% 98%  Weight:      Height:        Constitutional: NAD, calm, comfortable Vitals:   02/09/19 1415 02/09/19 1515 02/09/19 1645 02/09/19 1715  BP: (!) 149/91 (!) 158/90 (!) 172/66  139/71  Pulse: (!) 55  66   Resp: 14  11   Temp:      TempSrc:      SpO2: 100% 100% 100% 98%  Weight:      Height:       Eyes: PERRL, lids and conjunctivae normal ENMT: Mucous membranes are moist.  Neck: normal, supple, no masses, no thyromegaly Respiratory: clear to auscultation bilaterally, no wheezing, no crackles. Normal respiratory effort.  Cardiovascular: irregularly irregular, no JVD,  2+ pedal pulses. No carotid bruits.  Abdomen: no tenderness, no masses palpated. No hepatosplenomegaly. Bowel sounds positive.  Musculoskeletal: no clubbing / cyanosis.  Normal muscle tone.  Skin: no rashes, lesions, ulcers. No induration Neurologic: CN 2-12 grossly intact. Sensation intact, DTR normal. Strength 5/5 in all 4.  Psychiatric:  Alert and oriented x 3. Normal mood.     Labs on Admission: I have personally reviewed following labs and imaging studies  CBC: Recent Labs  Lab 02/09/19 0950 02/09/19 1040  WBC 8.4  --   NEUTROABS 6.7  --   HGB 13.7 14.6  HCT 42.9 43.0  MCV 90.3  --   PLT 193  --    Basic Metabolic Panel: Recent Labs  Lab 02/09/19 0950 02/09/19 1009 02/09/19 1040  NA 140  --  141  K 3.8  --  3.6  CL 104  --  104  CO2 24  --   --   GLUCOSE 140*  --  138*  BUN 18  --  21  CREATININE 1.00  --  0.80  CALCIUM 9.2  --   --   MG  --  2.0  --    GFR: Estimated Creatinine Clearance: 53.4 mL/min (by C-G formula based on SCr of 0.8 mg/dL). Liver Function Tests: Recent Labs  Lab 02/09/19 0950  AST 35  ALT 39  ALKPHOS 53  BILITOT 1.1  PROT 7.1  ALBUMIN 3.7   No results for input(s): LIPASE, AMYLASE in the last 168 hours. No results for input(s): AMMONIA in the last 168 hours. Coagulation Profile: No results for input(s): INR, PROTIME in the last 168 hours. Cardiac Enzymes: No results for input(s): CKTOTAL, CKMB, CKMBINDEX, TROPONINI in the last 168 hours. BNP (last 3 results) No results for input(s): PROBNP in the last 8760 hours. HbA1C: No  results for input(s): HGBA1C in the last 72 hours. CBG: No results for input(s): GLUCAP in the last 168 hours. Lipid Profile: Recent Labs    02/09/19 1009  TRIG 39   Thyroid Function Tests: No results for input(s): TSH, T4TOTAL, FREET4, T3FREE, THYROIDAB in the last 72 hours. Anemia Panel: Recent Labs    02/09/19 0950  FERRITIN 76   Urine analysis:    Component Value Date/Time   COLORURINE YELLOW 02/09/2019 1025  APPEARANCEUR CLEAR 02/09/2019 1025   LABSPEC 1.018 02/09/2019 1025   PHURINE 5.0 02/09/2019 1025   GLUCOSEU NEGATIVE 02/09/2019 1025   HGBUR SMALL (A) 02/09/2019 1025   BILIRUBINUR NEGATIVE 02/09/2019 1025   BILIRUBINUR negative 11/07/2017 1139   KETONESUR NEGATIVE 02/09/2019 1025   PROTEINUR 30 (A) 02/09/2019 1025   UROBILINOGEN 1.0 11/07/2017 1139   UROBILINOGEN 0.2 04/14/2012 0900   NITRITE NEGATIVE 02/09/2019 1025   LEUKOCYTESUR NEGATIVE 02/09/2019 1025    Radiological Exams on Admission: Ct Angio Chest Pe W And/or Wo Contrast  Result Date: 02/09/2019 CLINICAL DATA:  Headache, sore throat, body aches, and productive cough for the past week. EXAM: CT ANGIOGRAPHY CHEST WITH CONTRAST TECHNIQUE: Multidetector CT imaging of the chest was performed using the standard protocol during bolus administration of intravenous contrast. Multiplanar CT image reconstructions and MIPs were obtained to evaluate the vascular anatomy. CONTRAST:  75mL OMNIPAQUE IOHEXOL 350 MG/ML SOLN COMPARISON:  Chest x-ray from same day. FINDINGS: Cardiovascular: Satisfactory opacification of the pulmonary arteries to the segmental level. Acute lobar and segmental pulmonary emboli primarily involving the left upper lobe with minimal clot extending into the proximal left interlobar pulmonary artery and left lower lobe superior segmental pulmonary artery. Additional segmental and subsegmental pulmonary emboli in the right upper lobe anterior segment. Overall clot burden is small. Mild cardiomegaly  with normal RV/LV ratio. Trace pericardial effusion. Aneurysmal ascending thoracic aorta measuring up to 4.1 cm. Coronary, aortic arch, and branch vessel atherosclerotic vascular disease. Mediastinum/Nodes: No enlarged mediastinal, hilar, or axillary lymph nodes. Mildly patulous esophagus. Thyroid gland and trachea demonstrate no significant findings. Lungs/Pleura: Minimal dependent subsegmental atelectasis in both lower lobes. Mild diffuse central peribronchial thickening. No focal consolidation, pleural effusion, or pneumothorax. No suspicious pulmonary nodule. Upper Abdomen: No acute abnormality. Musculoskeletal: No chest wall abnormality. No acute or significant osseous findings. Review of the MIP images confirms the above findings. IMPRESSION: 1. Acute bilateral pulmonary emboli primarily involving both upper lobes. Overall clot burden is small. No right heart strain. 2. Ascending thoracic aortic aneurysm measuring up to 4.1 cm. Recommend annual imaging followup by CTA or MRA. This recommendation follows 2010 ACCF/AHA/AATS/ACR/ASA/SCA/SCAI/SIR/STS/SVM Guidelines for the Diagnosis and Management of Patients with Thoracic Aortic Disease. Circulation. 2010; 121: Z610-R604. Aortic aneurysm NOS (ICD10-I71.9) 3.  Aortic atherosclerosis (ICD10-I70.0). Critical Value/emergent results were called by telephone at the time of interpretation on 02/09/2019 at 5:06 pm to [facility], who verbally acknowledged these results. Electronically Signed   By: Obie Dredge M.D.   On: 02/09/2019 17:10   Dg Chest Port 1 View  Result Date: 02/09/2019 CLINICAL DATA:  Shortness of breath EXAM: PORTABLE CHEST 1 VIEW COMPARISON:  08/23/2011 FINDINGS: Cardiomegaly. No confluent airspace opacities, effusions or edema. No acute bony abnormality. IMPRESSION: No active disease. Electronically Signed   By: Charlett Nose M.D.   On: 02/09/2019 12:07    EKG: ventricular tachycardia   Assessment/Plan Active Problems:    Bilateral pulmonary embolism (HCC)   Bilateral pulmonary embolism: Started him on IV heparin, possible transition to oral eliquis on discharge.  Echocardiogram ordered and pending.  Lower extremity duplex pending.    Ventricular tachycardia:  Cardiology consulted  He is currently on IV amiodarone and lopressor 25 mg BID .  Keep potassium >4, magnesium >2.  Echocardiogram ordered and pending.  Further recommendations to follow.     Hypertension:  Well controlled. Resume amlodipine and lisinopril and lopressor.    BPH  Continue with flomax.       Severity of Illness:  The appropriate patient status for this patient is OBSERVATION. Observation status is judged to be reasonable and necessary in order to provide the required intensity of service to ensure the patient's safety. The patient's presenting symptoms, physical exam findings, and initial radiographic and laboratory data in the context of their medical condition is felt to place them at decreased risk for further clinical deterioration. Furthermore, it is anticipated that the patient will be medically stable for discharge from the hospital within 2 midnights of admission. The following factors support the patient status of observation.      DVT prophylaxis: heparin.  Code Status: full code.  Family Communication:none at bedside.  Disposition Plan: pending clinical improvement.  Consults called: cardiology  Admission status: SDU/ OBSERVATION   Hosie Poisson MD [facility] Pager [phone]  If 7PM-7AM, please contact night-coverage [email or web address] Password [facility]  02/09/2019, 6:17 PM

## 2019-02-09 NOTE — ED Provider Notes (Signed)
Care assumed from Dr. Darl Householder at shift change with cardiology and CTA pending.  In brief, this patient is a 85 year old male who presents for evaluation of worsening shortness of breath over last couple days.  He does report that he currently works at [employer] and has been around several people.  No known Covid exposure.  Please see note from previous provider for full history/physical exam.   Physical Exam  BP (!) 144/84   Pulse 61   Temp 98.3 F (36.8 C) (Oral)   Resp 17   Ht 5\' 5"  (1.651 m)   Wt 72.6 kg   SpO2 91%   BMI 26.63 kg/m   Physical Exam  ED Course/Procedures     .Critical Care Performed by: Volanda Napoleon, PA-C Authorized by: Volanda Napoleon, PA-C   Critical care provider statement:    Critical care time (minutes):  35   Critical care was necessary to treat or prevent imminent or life-threatening deterioration of the following conditions: PE.   Critical care was time spent personally by me on the following activities:  Discussions with consultants, evaluation of patient's response to treatment, examination of patient, ordering and performing treatments and interventions, ordering and review of laboratory studies, ordering and review of radiographic studies, pulse oximetry, re-evaluation of patient's condition, obtaining history from patient or surrogate and review of old charts    MDM    PLAN: Patient pending cardiology evaluation.  Cardiology decides to take him for runs of V. tach, he can be admitted if not, will need CTA and admission for medicine.  Cardiology discussed with Dr. Darl Householder who did not feel that patient needed emergent cath.  Recommended obtaining CTA and medicine admission.  MDM:  CTA shows bilateral PE.  No evidence of right heart strain.  Will give heparin bolus, start patient on heparin drip and plan for medical admission.  Discussed patient with Dr. Karleen Hampshire (hospitalist). Will plan for admission.   Portions of this note were  generated with [employer]. Dictation errors may occur despite best attempts at proofreading.  1. Ventricular tachycardia ([place])   2. Bilateral pulmonary embolism [facility])       Volanda Napoleon, PA-C 02/09/19 1934    Tegeler, Gwenyth Allegra, MD 02/10/19 0111

## 2019-02-09 NOTE — Consult Note (Signed)
ELECTROPHYSIOLOGY CONSULT NOTE    Primary Care Physician: Hoy RegisterNewlin, Enobong, MD Referring Physician:  Dr Mayford Knifeurner  Admit Date: 02/09/2019  Reason for consultation:   VT  Jesus Morrison is a 85 y.o. male with a h/o HTN now admitted with SOB, sore throat, and cough.  He was observed to have an episode of VT.  He has also been observed to have afib and subsequent short NSVT events. No symptoms with his arrhythmias.  Today, he denies symptoms of palpitations, chest pain, orthopnea, PND, lower extremity edema, dizziness, presyncope, syncope, or neurologic sequela. The patient is tolerating medications without difficulties and is otherwise without complaint today.   Past Medical History:  Diagnosis Date  . Arthritis    fingers and hands  . BPH (benign prostatic hypertrophy)    problems with acute urinary retention- has indwelling foley catheter  . Cold (disease)    last week ( week of Dec 22- Dec 29 ) - no longer coughing, no fever and feels better now  . Enlarged prostate   . Hypertension    Past Surgical History:  Procedure Laterality Date  .  Right elbow surgery as a child    . PROSTATECTOMY  04/17/2012   Procedure: PROSTATECTOMY RETROPUBIC;  Surgeon: Lindaann SloughMarc-Henry Nesi, MD;  Location: [facility];  Service: Urology;  Laterality: N/A;  Simple Retropubic Prostatectomy      . [START ON 02/10/2019] amLODipine  10 mg Oral Daily  . [START ON 02/10/2019] hydrochlorothiazide  25 mg Oral Daily  . [START ON 02/10/2019] lisinopril  40 mg Oral Daily  . metoprolol tartrate  25 mg Oral BID   . amiodarone 30 mg/hr (02/09/19 1602)  . heparin 1,200 Units/hr (02/09/19 1739)    No Known Allergies  Social History   Socioeconomic History  . Marital status: Single    Spouse name: Not on file  . Number of children: Not on file  . Years of education: Not on file  . Highest education level: Not on file  Occupational History  . Not on file  Social Needs  . Financial resource strain: Not on file  . Food  insecurity    Worry: Not on file    Inability: Not on file  . Transportation needs    Medical: Not on file    Non-medical: Not on file  Tobacco Use  . Smoking status: Never Smoker  . Smokeless tobacco: Never Used  Substance and Sexual Activity  . Alcohol use: Yes    Alcohol/week: 4.0 - 6.0 standard drinks    Types: 3 - 4 Cans of beer, 1 - 2 Shots of liquor per week    Comment: "weekends"  . Drug use: No  . Sexual activity: Not on file  Lifestyle  . Physical activity    Days per week: Not on file    Minutes per session: Not on file  . Stress: Not on file  Relationships  . Social Musicianconnections    Talks on phone: Not on file    Gets together: Not on file    Attends religious service: Not on file    Active member of club or organization: Not on file    Attends meetings of clubs or organizations: Not on file    Relationship status: Not on file  . Intimate partner violence    Fear of current or ex partner: Not on file    Emotionally abused: Not on file    Physically abused: Not on file    Forced sexual activity: Not  on file  Other Topics Concern  . Not on file  Social History Narrative  . Not on file    Family History  Problem Relation Age of Onset  . Liver disease Father   . Heart disease Brother     ROS- All systems are reviewed and negative except as per the HPI above  Physical Exam: Telemetry:  As above Vitals:   02/09/19 1800 02/09/19 1815 02/09/19 1845 02/09/19 2028  BP: (!) 155/62 (!) 144/84 (!) 154/90 (!) 171/90  Pulse: (!) 39 61 73 80  Resp: 12 17 17 16   Temp:    98.4 F (36.9 C)  TempSrc:    Oral  SpO2: (!) 88% 91% (!) 89% 100%  Weight:    74.3 kg  Height:    5\' 5"  (1.651 m)    GEN- The patient is elderly appearing, alert and oriented x 3 today.   Head- normocephalic, atraumatic Eyes-  Sclera clear, conjunctiva pink Ears- hearing intact Oropharynx- clear Neck- supple,  Lungs-  normal work of breathing Heart- Regular rate and rhythm,  GI- soft,  NT, ND, + BS Extremities- no clubbing, cyanosis, or edema MS- no significant deformity or atrophy Skin- no rash or lesion Psych- euthymic mood, full affect Neuro- strength and sensation are intact  EKG-  VT with LBB inferior axis morphology at transition in V3.  215 bpm EKG #2-  afib with frequent PVCs  Labs:   Lab Results  Component Value Date   WBC 8.4 02/09/2019   HGB 14.6 02/09/2019   HCT 43.0 02/09/2019   MCV 90.3 02/09/2019   PLT 193 02/09/2019    Recent Labs  Lab 02/09/19 0950 02/09/19 1040  NA 140 141  K 3.8 3.6  CL 104 104  CO2 24  --   BUN 18 21  CREATININE 1.00 0.80  CALCIUM 9.2  --   PROT 7.1  --   BILITOT 1.1  --   ALKPHOS 53  --   ALT 39  --   AST 35  --   GLUCOSE 140* 138*   Lab Results  Component Value Date   TROPONINI <0.30 04/20/2012    Lab Results  Component Value Date   CHOL 165 11/23/2018   CHOL 160 02/17/2018   CHOL 141 11/12/2016   Lab Results  Component Value Date   HDL 58 11/23/2018   HDL 57 02/17/2018   HDL 51 11/12/2016    Radiology: CTA reveals Acute bilateral pulmonary emboli primarily involving both upper lobes. Overall clot burden is small. No right heart strain  Echo: EF 60%, LVH, severe LA enlargement  ASSESSMENT AND PLAN:  1. VT The patient has had RVOT VT (benign focal VT) in the setting of acute PTE.  His EF is preserved.  His VT should resolve as his clinical course improves. Continue IV amiodarone for now.  Transition to oral in 24 hours.  Continue to titrate metoprolol as BP allows. No indication for ICD for further EP interventions  2. afib Given severe LA enlargeent on echo, I suspect that this is chronic for him. He has been started on heparin drip for PTE and plans for transition to oral eliquis prior to discharge are noted chads2vasc score is 3. Continue to titrate metoprolol for rate control Also on amiodarone for rate control  3. HTN Home medicines resumed Titrate metoprolol as able  4. PTE  Per primary team Plans for heparin drip/ transition to eliquis noted  Given advanced age, a conservative approach is  advised.  He is not a candidate for EP procedures. General cardiology to follow.    Thompson Grayer, MD 02/09/2019  8:46 PM

## 2019-02-09 NOTE — Consult Note (Addendum)
Cardiology consult nore   Patient ID: Jesus Morrison MRN: [id number]; DOB: 1927-02-19   Admission date: 02/09/2019  Primary Care Provider: Hoy Register, MD Primary Cardiologist: New  Chief Complaint: SOB  Patient Profile:   Jesus Morrison is a 84 y.o. [place] who appears much younger than stated age with history of hypertension and BPH s/p retropubic prostatectomy in 04/2012 presented for acute onset shortness of breath. Seen at request of Dr. Silverio Lay.   Patient denies prior cardiac history.  No tobacco smoking or illicit drug use.  Drinks alcohol socially.  History of Present Illness:   Jesus Morrison presented with sudden onset redness of breath last night.  Intermittent lasting for few seconds. He was unable to sleep due to this.  No associated chest pain, palpitation, dizziness or syncope.  He works full-time  at [employer]. He was rround multiple people but no known exposure to Covid.   In emergency room patient has intermittent episode of ventricular tachycardia.  This is associated with his symptoms of shortness of breath.  He is breaking out by himself.  Underlying rhythm new onset atrial fibrillation at rate of 70s to 80s with frequent PVCs.  Telemetry reviewed personally. Started on IV amiodarone.    Potassium 3.8.  Magnesium 2.0.  BNP 400.  LDH 149.  Scr 1.0. High-sensitivity troponin 34>> 42.  D-dimer elevated at 2.81.  Pending CT Angie of the chest.  Blood culture and urine cultures pending.  Heart Pathway Score:     Past Medical History:  Diagnosis Date  . Arthritis    fingers and hands  . BPH (benign prostatic hypertrophy)    problems with acute urinary retention- has indwelling foley catheter  . Cold (disease)    last week ( week of Dec 22- Dec 29 ) - no longer coughing, no fever and feels better now  . Enlarged prostate   . Hypertension     Past Surgical History:  Procedure Laterality Date  .  Right elbow surgery as a child    . PROSTATECTOMY  04/17/2012   Procedure: PROSTATECTOMY RETROPUBIC;  Surgeon: Lindaann Slough, MD;  Location: [facility];  Service: Urology;  Laterality: N/A;  Simple Retropubic Prostatectomy       Medications Prior to Admission: Prior to Admission medications   Medication Sig Start Date End Date Taking? Authorizing Provider  amLODipine (NORVASC) 10 MG tablet Take 1 tablet (10 mg total) by mouth daily. 11/23/18   Jesus Register, MD  cetirizine (ZYRTEC) 10 MG tablet Take 1 tablet (10 mg total) by mouth daily. 02/12/18   Jesus Register, MD  hydrocortisone cream 0.5 % Apply 1 application topically 2 (two) times daily. Patient not taking: Reported on 08/19/2018 05/16/17   Jesus Register, MD  isosorbide mononitrate (IMDUR) 60 MG 24 hr tablet Take 1 tablet (60 mg total) by mouth daily. 11/23/18   Jesus Register, MD  lactulose (CHRONULAC) 10 GM/15ML solution Take 15 mLs (10 g total) by mouth 2 (two) times daily as needed for mild constipation. 11/23/18   Jesus Register, MD  lisinopril-hydrochlorothiazide (ZESTORETIC) 20-12.5 MG tablet Take 2 tablets by mouth daily. 11/23/18   Jesus Register, MD  Misc. Devices MISC Blood Pressure monitor Dx: Hypertension 08/19/18   Jesus Register, MD  polyethylene glycol (MIRALAX / GLYCOLAX) packet Take 17 g by mouth daily. 09/19/17   Khatri, Hina, PA-C  tamsulosin (FLOMAX) 0.4 MG CAPS capsule Take 1 capsule (0.4 mg total) by mouth daily. 11/23/18   Jesus Register, MD  triamcinolone cream (KENALOG) 0.1 %  Apply 1 application topically 2 (two) times daily. 05/20/18   Jesus RegisterNewlin, Enobong, MD     Allergies:   No Known Allergies  Social History:   Social History   Socioeconomic History  . Marital status: Single    Spouse name: Not on file  . Number of children: Not on file  . Years of education: Not on file  . Highest education level: Not on file  Occupational History  . Not on file  Social Needs  . Financial resource strain: Not on file  . Food insecurity    Worry: Not on file    Inability: Not on  file  . Transportation needs    Medical: Not on file    Non-medical: Not on file  Tobacco Use  . Smoking status: Never Smoker  . Smokeless tobacco: Never Used  Substance and Sexual Activity  . Alcohol use: Yes    Alcohol/week: 4.0 - 6.0 standard drinks    Types: 3 - 4 Cans of beer, 1 - 2 Shots of liquor per week    Comment: "weekends"  . Drug use: No  . Sexual activity: Not on file  Lifestyle  . Physical activity    Days per week: Not on file    Minutes per session: Not on file  . Stress: Not on file  Relationships  . Social Musicianconnections    Talks on phone: Not on file    Gets together: Not on file    Attends religious service: Not on file    Active member of club or organization: Not on file    Attends meetings of clubs or organizations: Not on file    Relationship status: Not on file  . Intimate partner violence    Fear of current or ex partner: Not on file    Emotionally abused: Not on file    Physically abused: Not on file    Forced sexual activity: Not on file  Other Topics Concern  . Not on file  Social History Narrative  . Not on file    Family History:   The patient's family history includes Heart disease in his brother; Liver disease in his father.    ROS:  Please see the history of present illness. All other ROS reviewed and negative.     Physical Exam/Data:   Vitals:   02/09/19 1115 02/09/19 1130 02/09/19 1300 02/09/19 1415  BP: 126/64 127/69 (!) 153/71 (!) 149/91  Pulse: (!) 35 74  (!) 55  Resp: 18 18 17 14   Temp:      TempSrc:      SpO2: 100% 99%  100%  Weight:      Height:       No intake or output data in the 24 hours ending 02/09/19 1457 Last 3 Weights 02/09/2019 11/23/2018 05/20/2018  Weight (lbs) 160 lb 169 lb 12.8 oz 175 lb 9.6 oz  Weight (kg) 72.576 kg 77.021 kg 79.652 kg     Body mass index is 26.63 kg/m.  General:  Well nourished, well developed, in no acute distress HEENT: normal Lymph: no adenopathy Neck: no JVD Endocrine:  No  thryomegaly Vascular: No carotid bruits; FA pulses 2+ bilaterally without bruits  Cardiac:  normal S1, S2; RRR; no murmur  Lungs:  clear to auscultation bilaterally, no wheezing, rhonchi or rales  Abd: soft, nontender, no hepatomegaly  Ext: no edema Musculoskeletal:  No deformities, BUE and BLE strength normal and equal Skin: warm and dry  Neuro:  CNs 2-12 intact,  no focal abnormalities noted Psych:  Normal affect    EKG:  The ECG that was done today was personally reviewed and demonstrates atrial fibrillation at rate of 133 bpm  Relevant CV Studies: As above  Laboratory Data:  High Sensitivity Troponin:   Recent Labs  Lab 02/09/19 1009 02/09/19 1151  TROPONINIHS 34* 42*      Chemistry Recent Labs  Lab 02/09/19 0950 02/09/19 1040  NA 140 141  K 3.8 3.6  CL 104 104  CO2 24  --   GLUCOSE 140* 138*  BUN 18 21  CREATININE 1.00 0.80  CALCIUM 9.2  --   GFRNONAA >60  --   GFRAA >60  --   ANIONGAP 12  --     Recent Labs  Lab 02/09/19 0950  PROT 7.1  ALBUMIN 3.7  AST 35  ALT 39  ALKPHOS 53  BILITOT 1.1   Hematology Recent Labs  Lab 02/09/19 0950 02/09/19 1040  WBC 8.4  --   RBC 4.75  --   HGB 13.7 14.6  HCT 42.9 43.0  MCV 90.3  --   MCH 28.8  --   MCHC 31.9  --   RDW 13.2  --   PLT 193  --    BNP Recent Labs  Lab 02/09/19 0956  BNP 400.8*    DDimer  Recent Labs  Lab 02/09/19 0950  DDIMER 2.81*    Radiology/Studies:  Dg Chest Port 1 View  Result Date: 02/09/2019 CLINICAL DATA:  Shortness of breath EXAM: PORTABLE CHEST 1 VIEW COMPARISON:  08/23/2011 FINDINGS: Cardiomegaly. No confluent airspace opacities, effusions or edema. No acute bony abnormality. IMPRESSION: No active disease. Electronically Signed   By: Rolm Baptise M.D.   On: 02/09/2019 12:07    Assessment and Plan:   1. Wide-complex tachycardia Patient has intermittent sustained tachycardia with self resolution.  Associated with shortness of breath without chest pain, palpitation  ot dizziness.  No syncope.  He has no prior cardiac history. Arrhythmia improving on amiodarone.  Potassium 3.8.  Magnesium 2.0.  Keep potassium above 4.  Get echocardiogram.  Check TSH. EP to see today   2.  New onset atrial fibrillation -Seems underlying rhythm of atrial fibrillation.  CHADSCASCs score of 2 for HTN and Age. Anticoagulation per MD.  3. Elevated D-dimer -Pending CT Angie of chest - Per admitting team  4.  Hypertension -Blood pressure relatively stable   5. Flu/COVID likely symptoms - COVID is negative.  - Per primary     For questions or updates, please contact [place] Please consult [email or web address] for contact info under        Jarrett Soho, PA  02/09/2019 2:57 PM

## 2019-02-09 NOTE — ED Notes (Signed)
Cards paged to [place], RN called by Levada Dy

## 2019-02-09 NOTE — ED Notes (Addendum)
Daughter [phone] Juliene Pina)

## 2019-02-09 NOTE — ED Triage Notes (Signed)
Pt reports generalized body aches with runny nose, productive cough with white mucous, sore throat and headache for the past week. Pt also reports sob at night.

## 2019-02-09 NOTE — ED Provider Notes (Addendum)
[facility] EMERGENCY DEPARTMENT Provider Note   CSN: [id number] Arrival date & time: 02/09/19  0906     History   Chief Complaint Chief Complaint  Patient presents with  . Generalized Body Aches    HPI Jesus Morrison is a 85 y.o. male hx of BPH, here with SOB, difficulty catching his breath.  Patient states that since yesterday, he has some trouble catching his breath.  He states that he had trouble sleeping yesterday due to shortness of breath. Denies any chest pain or palpitations.  Denies any leg swelling.  Patient has no heart problem in the past.  Patient does work in [employer] and are around all the people.  Initially he told triage that he has some sinus congestion and runny nose for several days but denies it to me.  He has no known Covid contacts.      The history is provided by the patient.    Past Medical History:  Diagnosis Date  . Arthritis    fingers and hands  . BPH (benign prostatic hypertrophy)    problems with acute urinary retention- has indwelling foley catheter  . Cold (disease)    last week ( week of Dec 22- Dec 29 ) - no longer coughing, no fever and feels better now  . Enlarged prostate   . Hypertension     Patient Active Problem List   Diagnosis Date Noted  . Scrotal hernia 11/07/2017  . BPH (benign prostatic hyperplasia) 05/19/2017  . Bradycardia 01/26/2016  . Hypertension 08/25/2015    Past Surgical History:  Procedure Laterality Date  .  Right elbow surgery as a child    . PROSTATECTOMY  04/17/2012   Procedure: PROSTATECTOMY RETROPUBIC;  Surgeon: Lindaann Slough, MD;  Location: [facility];  Service: Urology;  Laterality: N/A;  Simple Retropubic Prostatectomy          Home Medications    Prior to Admission medications   Medication Sig Start Date End Date Taking? Authorizing Provider  amLODipine (NORVASC) 10 MG tablet Take 1 tablet (10 mg total) by mouth daily. 11/23/18   Hoy Register, MD  cetirizine (ZYRTEC) 10 MG  tablet Take 1 tablet (10 mg total) by mouth daily. 02/12/18   Hoy Register, MD  hydrocortisone cream 0.5 % Apply 1 application topically 2 (two) times daily. Patient not taking: Reported on 08/19/2018 05/16/17   Hoy Register, MD  isosorbide mononitrate (IMDUR) 60 MG 24 hr tablet Take 1 tablet (60 mg total) by mouth daily. 11/23/18   Hoy Register, MD  lactulose (CHRONULAC) 10 GM/15ML solution Take 15 mLs (10 g total) by mouth 2 (two) times daily as needed for mild constipation. 11/23/18   Hoy Register, MD  lisinopril-hydrochlorothiazide (ZESTORETIC) 20-12.5 MG tablet Take 2 tablets by mouth daily. 11/23/18   Hoy Register, MD  Misc. Devices MISC Blood Pressure monitor Dx: Hypertension 08/19/18   Hoy Register, MD  polyethylene glycol (MIRALAX / GLYCOLAX) packet Take 17 g by mouth daily. 09/19/17   Khatri, Hina, PA-C  tamsulosin (FLOMAX) 0.4 MG CAPS capsule Take 1 capsule (0.4 mg total) by mouth daily. 11/23/18   Hoy Register, MD  triamcinolone cream (KENALOG) 0.1 % Apply 1 application topically 2 (two) times daily. 05/20/18   Hoy Register, MD    Family History No family history on file.  Social History Social History   Tobacco Use  . Smoking status: Never Smoker  . Smokeless tobacco: Never Used  Substance Use Topics  . Alcohol use: Yes  Alcohol/week: 4.0 - 6.0 standard drinks    Types: 3 - 4 Cans of beer, 1 - 2 Shots of liquor per week    Comment: "weekends"  . Drug use: No     Allergies   Patient has no known allergies.   Review of Systems Review of Systems  Respiratory: Positive for shortness of breath.   All other systems reviewed and are negative.    Physical Exam Updated Vital Signs BP (!) 146/73 (BP Location: Left Arm)   Pulse (!) 109   Temp 98.3 F (36.8 C) (Oral)   Resp 20   Ht 5\' 5"  (1.651 m)   Wt 72.6 kg   SpO2 100%   BMI 26.63 kg/m   Physical Exam Vitals signs and nursing note reviewed.  HENT:     Head: Normocephalic.     Nose: Nose  normal.     Mouth/Throat:     Mouth: Mucous membranes are moist.  Eyes:     Extraocular Movements: Extraocular movements intact.     Pupils: Pupils are equal, round, and reactive to light.  Neck:     Musculoskeletal: Normal range of motion.  Cardiovascular:     Rate and Rhythm: Rhythm irregular.     Comments: Irregular  Pulmonary:     Effort: Pulmonary effort is normal.     Breath sounds: Normal breath sounds.  Abdominal:     General: Abdomen is flat.     Palpations: Abdomen is soft.  Musculoskeletal: Normal range of motion.     Comments: 1+ edema   Skin:    General: Skin is warm.     Capillary Refill: Capillary refill takes less than 2 seconds.  Neurological:     General: No focal deficit present.     Mental Status: He is alert. Mental status is at baseline.  Psychiatric:        Mood and Affect: Mood normal.      ED Treatments / Results  Labs (all labs ordered are listed, but only abnormal results are displayed) Labs Reviewed  SARS CORONAVIRUS 2 BY RT PCR (HOSPITAL ORDER, PERFORMED IN [place] [facility])  CULTURE, BLOOD (ROUTINE X 2)  CULTURE, BLOOD (ROUTINE X 2)  URINE CULTURE  LACTIC ACID, PLASMA  LACTIC ACID, PLASMA  CBC WITH DIFFERENTIAL/PLATELET  COMPREHENSIVE METABOLIC PANEL  D-DIMER, QUANTITATIVE (NOT AT [facility])  PROCALCITONIN  LACTATE DEHYDROGENASE  FERRITIN  TRIGLYCERIDES  FIBRINOGEN  C-REACTIVE PROTEIN  URINALYSIS, ROUTINE W REFLEX MICROSCOPIC  BRAIN NATRIURETIC PEPTIDE  MAGNESIUM  I-STAT CHEM 8, ED  TROPONIN I (HIGH SENSITIVITY)    EKG EKG Interpretation  Date/Time:  Tuesday February 09 2019 10:04:49 EDT Ventricular Rate:  215 PR Interval:    QRS Duration: 171 QT Interval:  276 QTC Calculation: 522 R Axis:   76 Text Interpretation: Extreme tachycardia with wide complex, no further rhythm analysis attempted Vtach new since previous Confirmed by Richardean CanalYao, Sylvester Salonga H [phone]([id number]) on 02/09/2019 10:21:44 AM   Radiology No results found.   Procedures Procedures (including critical care time)  CRITICAL CARE Performed by: Richardean Canalavid H Shafiq Larch   Total critical care time: 30 minutes  Critical care time was exclusive of separately billable procedures and treating other patients.  Critical care was necessary to treat or prevent imminent or life-threatening deterioration.  Critical care was time spent personally by me on the following activities: development of treatment plan with patient and/or surrogate as well as nursing, discussions with consultants, evaluation of patient's response to treatment, examination of patient, obtaining  history from patient or surrogate, ordering and performing treatments and interventions, ordering and review of laboratory studies, ordering and review of radiographic studies, pulse oximetry and re-evaluation of patient's condition.   Medications Ordered in ED Medications  albuterol (VENTOLIN HFA) 108 (90 Base) MCG/ACT inhaler 2 puff (has no administration in time range)  amiodarone (NEXTERONE) 1.8 mg/mL load via infusion 150 mg (has no administration in time range)    Followed by  amiodarone (NEXTERONE PREMIX) 360-4.14 MG/200ML-% (1.8 mg/mL) IV infusion (has no administration in time range)    Followed by  amiodarone (NEXTERONE PREMIX) 360-4.14 MG/200ML-% (1.8 mg/mL) IV infusion (has no administration in time range)     Initial Impression / Assessment and Plan / ED Course  I have reviewed the triage vital signs and the nursing notes.  Pertinent labs & imaging results that were available during my care of the patient were reviewed by me and considered in my medical decision making (see chart for details).       Chistian Kasler is a 85 y.o. male here with SOB. SOB since yesterday.  Consider ACS versus heart failure versus arrhythmia versus Covid. Will get labs, BNP, COVID admission labs.   10 am  I was called into the room and patient appears to be in Vtach rate of 200. He is mentating well and has  normal blood pressure. I think Jesus Morrison is likely causing his symptoms.   12 pm Vtach controlled with amiodarone drip. Cardiology consulted.   3 pm Cardiology saw patient. Trop and BNP and d-dimer elevated. They recommend CTA chest and medicine admission. Still on amiodarone drip. Patient to be seen by EP as well   4:07 PM CTA pending. Anticipate admission to medicine service. Signed out to Simonne Martinet in the ED.   Jesus Morrison was evaluated in Emergency Department on 02/09/2019 for the symptoms described in the history of present illness. He was evaluated in the context of the global COVID-19 pandemic, which necessitated consideration that the patient might be at risk for infection with the SARS-CoV-2 virus that causes COVID-19. Institutional protocols and algorithms that pertain to the evaluation of patients at risk for COVID-19 are in a state of rapid change based on information released by regulatory bodies including the CDC and federal and state organizations. These policies and algorithms were followed during the patient's care in the ED.    Final Clinical Impressions(s) / ED Diagnoses   Final diagnoses:  None    ED Discharge Orders    None       Drenda Freeze, MD 02/09/19 1607    Drenda Freeze, MD 02/09/19 [phone]

## 2019-02-10 ENCOUNTER — Observation Stay (HOSPITAL_COMMUNITY): Payer: 59

## 2019-02-10 DIAGNOSIS — I82452 Acute embolism and thrombosis of left peroneal vein: Secondary | ICD-10-CM | POA: Diagnosis present

## 2019-02-10 DIAGNOSIS — M1990 Unspecified osteoarthritis, unspecified site: Secondary | ICD-10-CM | POA: Diagnosis present

## 2019-02-10 DIAGNOSIS — R3911 Hesitancy of micturition: Secondary | ICD-10-CM | POA: Diagnosis not present

## 2019-02-10 DIAGNOSIS — I4891 Unspecified atrial fibrillation: Secondary | ICD-10-CM

## 2019-02-10 DIAGNOSIS — I493 Ventricular premature depolarization: Secondary | ICD-10-CM | POA: Diagnosis present

## 2019-02-10 DIAGNOSIS — R0602 Shortness of breath: Secondary | ICD-10-CM | POA: Diagnosis not present

## 2019-02-10 DIAGNOSIS — I10 Essential (primary) hypertension: Secondary | ICD-10-CM | POA: Diagnosis present

## 2019-02-10 DIAGNOSIS — I2699 Other pulmonary embolism without acute cor pulmonale: Secondary | ICD-10-CM

## 2019-02-10 DIAGNOSIS — R0600 Dyspnea, unspecified: Secondary | ICD-10-CM

## 2019-02-10 DIAGNOSIS — I959 Hypotension, unspecified: Secondary | ICD-10-CM | POA: Diagnosis not present

## 2019-02-10 DIAGNOSIS — I82433 Acute embolism and thrombosis of popliteal vein, bilateral: Secondary | ICD-10-CM | POA: Diagnosis present

## 2019-02-10 DIAGNOSIS — Z79899 Other long term (current) drug therapy: Secondary | ICD-10-CM | POA: Diagnosis not present

## 2019-02-10 DIAGNOSIS — I2609 Other pulmonary embolism with acute cor pulmonale: Secondary | ICD-10-CM | POA: Diagnosis not present

## 2019-02-10 DIAGNOSIS — N401 Enlarged prostate with lower urinary tract symptoms: Secondary | ICD-10-CM | POA: Diagnosis not present

## 2019-02-10 DIAGNOSIS — Z9079 Acquired absence of other genital organ(s): Secondary | ICD-10-CM | POA: Diagnosis not present

## 2019-02-10 DIAGNOSIS — I472 Ventricular tachycardia, unspecified: Secondary | ICD-10-CM

## 2019-02-10 DIAGNOSIS — I480 Paroxysmal atrial fibrillation: Secondary | ICD-10-CM | POA: Diagnosis not present

## 2019-02-10 DIAGNOSIS — N400 Enlarged prostate without lower urinary tract symptoms: Secondary | ICD-10-CM | POA: Diagnosis present

## 2019-02-10 DIAGNOSIS — Z8249 Family history of ischemic heart disease and other diseases of the circulatory system: Secondary | ICD-10-CM | POA: Diagnosis not present

## 2019-02-10 DIAGNOSIS — R7989 Other specified abnormal findings of blood chemistry: Secondary | ICD-10-CM | POA: Diagnosis present

## 2019-02-10 DIAGNOSIS — Z20828 Contact with and (suspected) exposure to other viral communicable diseases: Secondary | ICD-10-CM | POA: Diagnosis present

## 2019-02-10 LAB — BASIC METABOLIC PANEL
Anion gap: 10 (ref 5–15)
BUN: 13 mg/dL (ref 8–23)
CO2: 24 mmol/L (ref 22–32)
Calcium: 8.5 mg/dL — ABNORMAL LOW (ref 8.9–10.3)
Chloride: 104 mmol/L (ref 98–111)
Creatinine, Ser: 0.85 mg/dL (ref 0.61–1.24)
GFR calc Af Amer: >60 mL/min (ref >60)
GFR calc non Af Amer: >60 mL/min (ref >60)
Glucose, Bld: 111 mg/dL — ABNORMAL HIGH (ref 70–99)
Potassium: 3.6 mmol/L (ref 3.5–5.1)
Sodium: 138 mmol/L (ref 135–145)

## 2019-02-10 LAB — CBC
HCT: 41.4 % (ref 39.0–52.0)
Hemoglobin: 13.5 g/dL (ref 13.0–17.0)
MCH: 28.7 pg (ref 26.0–34.0)
MCHC: 32.6 g/dL (ref 30.0–36.0)
MCV: 88.1 fL (ref 80.0–100.0)
Platelets: 193 10*3/uL (ref 150–400)
RBC: 4.7 MIL/uL (ref 4.22–5.81)
RDW: 13.2 % (ref 11.5–15.5)
WBC: 8.5 10*3/uL (ref 4.0–10.5)
nRBC: 0 % (ref 0.0–0.2)

## 2019-02-10 LAB — URINE CULTURE: Culture: <10000 — AB

## 2019-02-10 LAB — HEPARIN LEVEL (UNFRACTIONATED)
Heparin Unfractionated: 0.67 IU/mL (ref 0.30–0.70)
Heparin Unfractionated: 0.75 IU/mL — ABNORMAL HIGH (ref 0.30–0.70)

## 2019-02-10 LAB — TSH: TSH: 1.421 u[IU]/mL (ref 0.350–4.500)

## 2019-02-10 MED ORDER — AMIODARONE HCL 200 MG PO TABS
200.0000 mg | ORAL_TABLET | Freq: Once | ORAL | Status: AC
  Administered 2019-02-10: 200 mg via ORAL
  Filled 2019-02-10: qty 1

## 2019-02-10 MED ORDER — AMIODARONE HCL 200 MG PO TABS
400.0000 mg | ORAL_TABLET | Freq: Two times a day (BID) | ORAL | Status: DC
  Administered 2019-02-10 – 2019-02-11 (×2): 400 mg via ORAL
  Filled 2019-02-10 (×2): qty 2

## 2019-02-10 MED ORDER — AMIODARONE HCL 200 MG PO TABS: 200.0000 mg | ORAL_TABLET | Freq: Every day | ORAL | Status: DC

## 2019-02-10 MED ORDER — POTASSIUM CHLORIDE CRYS ER 20 MEQ PO TBCR
40.0000 meq | EXTENDED_RELEASE_TABLET | Freq: Once | ORAL | Status: AC
  Administered 2019-02-10: 40 meq via ORAL

## 2019-02-10 MED ORDER — LORAZEPAM 2 MG/ML IJ SOLN: 0.5000 mg | Freq: Four times a day (QID) | INTRAMUSCULAR | Status: DC | PRN

## 2019-02-10 NOTE — Progress Notes (Addendum)
PROGRESS NOTE    Daisy Mcneel   ZOX:[id number]  DOB: 1926/07/08  DOA: 02/09/2019 PCP: Hoy Register, MD   Brief Narrative:  Demarquez Ciolek is a 85 year old male with BPH, hypertension who woke up in the middle of the night suddenly short of breath.  He also noted sore throat and a cough.  In the ED he was noted to have A. fib with RVR and then developed V. Tach. He was started on an amiodarone infusion.  Urology was consulted.  A CTA was done which revealed pulmonary emboli.  He was started on IV heparin. COVID-19 was negative.  Subjective: Patient has no complaints of shortness of breath today.  He has no cough or sore throat.    Assessment & Plan:   Principal Problem:   Bilateral pulmonary embolism - Acute bilateral pulmonary emboli primarily involving both upper lobes. Overall clot burden is small - vascular ultrasound LE> deep vein thrombosis involving the right popliteal vein, left popliteal vein, and left peroneal veins. -He is on IV heparin- Cardiology feels recurrent V tach is due to PEs but clot burden is small- will consult PCCM to see if he needs thrombolysis  Active Problems:     A-fib /   V tach with mildly elevated troponin as a result - check TSH - ECHO shows diastolic CHF- see below - he had more runs of v tach today - > 30 beats - on Amiodarone per cardiology- IV changed to oral today but he received another loading dose orally.  - cont to follow on telemetry- not a safe discharge yet. - keep K > 4.0- given a dose of K dur today  Mod LVH/ h/o HTN - On Norvasc, Lisinopril, Imdur and Toprol now-  Place holding parameter on meds- follow  HTN - cont Lisinopril  BPH -cont Flomax    Time spent in minutes: 40  DVT prophylaxis: Heparin infusion Code Status:  Full code Family Communication:   Disposition Plan: cont to follow in progressive care Consultants:   Cardiology  EP  PCCM Procedures:   2 D ECHO 1. Left ventricular ejection fraction, by visual  estimation, is 60 to 65%. The left ventricle has normal function. Normal left ventricular size. There is moderately increased left ventricular hypertrophy.  2. Global right ventricle has normal systolic function.The right ventricular size is normal. Mildly increased right ventricular wall thickness.  3. Left atrial size was severely dilated.  4. Right atrial size was mildly dilated.  5. Small pericardial effusion.  6. The pericardial effusion is circumferential.  7. The mitral valve is normal in structure. Trace mitral valve regurgitation.  8. The tricuspid valve is normal in structure. Tricuspid valve regurgitation is mild.  9. The aortic valve is normal in structure. Aortic valve regurgitation is mild by color flow Doppler. Mild aortic valve sclerosis without stenosis. 10. The pulmonic valve was not well visualized. Pulmonic valve regurgitation is not visualized by color flow Doppler. 11. There is mild dilatation of the ascending aorta measuring 39 mm. 12. Mildly elevated pulmonary artery systolic pressure. 13. The inferior vena cava is dilated in size with <50% respiratory variability, suggesting right atrial pressure of 15 mmHg. Antimicrobials:  Anti-infectives (From admission, onward)   None       Objective: Vitals:   02/10/19 0945 02/10/19 0950 02/10/19 0955 02/10/19 1100  BP: (!) 143/67 (!) 97/58 91/63 112/75  Pulse:    (!) 39  Resp: (!) 24  Temp:    98.3 F (36.8  C)  TempSrc:    Oral  SpO2:    100%  Weight:      Height:        Intake/Output Summary (Last 24 hours) at 02/10/2019 1229 Last data filed at 02/10/2019 0800 Gross per 24 hour  Intake 398.45 ml  Output 375 ml  Net 23.45 ml   Filed Weights   02/09/19 0936 02/09/19 2028  Weight: 72.6 kg 74.3 kg    Examination: General exam: Appears comfortable  HEENT: PERRLA, oral mucosa moist, no sclera icterus or thrush Respiratory system: Clear to auscultation. Respiratory effort normal. Cardiovascular  system: S1 & S2 heard, RRR.   Gastrointestinal system: Abdomen soft, non-tender, nondistended. Normal bowel sounds. Central nervous system: Alert and oriented. No focal neurological deficits. Extremities: No cyanosis, clubbing or edema Skin: No rashes or ulcers Psychiatry:  Mood & affect appropriate.     Data Reviewed: I have personally reviewed following labs and imaging studies  CBC: Recent Labs  Lab 02/09/19 0950 02/09/19 1040 02/10/19 0235  WBC 8.4  --  8.5  NEUTROABS 6.7  --   --   HGB 13.7 14.6 13.5  HCT 42.9 43.0 41.4  MCV 90.3  --  88.1  PLT 193  --  193   Basic Metabolic Panel: Recent Labs  Lab 02/09/19 0950 02/09/19 1009 02/09/19 1040 02/10/19 0235  NA 140  --  141 138  K 3.8  --  3.6 3.6  CL 104  --  104 104  CO2 24  --   --  24  GLUCOSE 140*  --  138* 111*  BUN 18  --  21 13  CREATININE 1.00  --  0.80 0.85  CALCIUM 9.2  --   --  8.5*  MG  --  2.0  --   --    GFR: Estimated Creatinine Clearance: 54.4 mL/min (by C-G formula based on SCr of 0.85 mg/dL). Liver Function Tests: Recent Labs  Lab 02/09/19 0950  AST 35  ALT 39  ALKPHOS 53  BILITOT 1.1  PROT 7.1  ALBUMIN 3.7   No results for input(s): LIPASE, AMYLASE in the last 168 hours. No results for input(s): AMMONIA in the last 168 hours. Coagulation Profile: No results for input(s): INR, PROTIME in the last 168 hours. Cardiac Enzymes: No results for input(s): CKTOTAL, CKMB, CKMBINDEX, TROPONINI in the last 168 hours. BNP (last 3 results) No results for input(s): PROBNP in the last 8760 hours. HbA1C: No results for input(s): HGBA1C in the last 72 hours. CBG: No results for input(s): GLUCAP in the last 168 hours. Lipid Profile: Recent Labs    02/09/19 1009  TRIG 39   Thyroid Function Tests: No results for input(s): TSH, T4TOTAL, FREET4, T3FREE, THYROIDAB in the last 72 hours. Anemia Panel: Recent Labs    02/09/19 0950  FERRITIN 76   Urine analysis:    Component Value Date/Time     COLORURINE YELLOW 02/09/2019 1025   APPEARANCEUR CLEAR 02/09/2019 1025   LABSPEC 1.018 02/09/2019 1025   PHURINE 5.0 02/09/2019 1025   GLUCOSEU NEGATIVE 02/09/2019 1025   HGBUR SMALL (A) 02/09/2019 1025   BILIRUBINUR NEGATIVE 02/09/2019 1025   BILIRUBINUR negative 11/07/2017 1139   KETONESUR NEGATIVE 02/09/2019 1025   PROTEINUR 30 (A) 02/09/2019 1025   UROBILINOGEN 1.0 11/07/2017 1139   UROBILINOGEN 0.2 04/14/2012 0900   NITRITE NEGATIVE 02/09/2019 1025   LEUKOCYTESUR NEGATIVE 02/09/2019 1025   Sepsis Labs: @LABRCNTIP (procalcitonin:4,lacticidven:4) ) Recent Results (from the past 240 hour(s))  SARS Coronavirus 2  by RT PCR (hospital order, performed in [facility] hospital lab) Nasopharyngeal Nasopharyngeal Swab     Status: None   Collection Time: 02/09/19 10:14 AM   Specimen: Nasopharyngeal Swab  Result Value Ref Range Status   SARS Coronavirus 2 NEGATIVE NEGATIVE Final    Comment: (NOTE) If result is NEGATIVE SARS-CoV-2 target nucleic acids are NOT DETECTED. The SARS-CoV-2 RNA is generally detectable in upper and lower  respiratory specimens during the acute phase of infection. The lowest  concentration of SARS-CoV-2 viral copies this assay can detect is 250  copies / mL. A negative result does not preclude SARS-CoV-2 infection  and should not be used as the sole basis for treatment or other  patient management decisions.  A negative result may occur with  improper specimen collection / handling, submission of specimen other  than nasopharyngeal swab, presence of viral mutation(s) within the  areas targeted by this assay, and inadequate number of viral copies  (<250 copies / mL). A negative result must be combined with clinical  observations, patient history, and epidemiological information. If result is POSITIVE SARS-CoV-2 target nucleic acids are DETECTED. The SARS-CoV-2 RNA is generally detectable in upper and lower  respiratory specimens dur ing the acute phase of  infection.  Positive  results are indicative of active infection with SARS-CoV-2.  Clinical  correlation with patient history and other diagnostic information is  necessary to determine patient infection status.  Positive results do  not rule out bacterial infection or co-infection with other viruses. If result is PRESUMPTIVE POSTIVE SARS-CoV-2 nucleic acids MAY BE PRESENT.   A presumptive positive result was obtained on the submitted specimen  and confirmed on repeat testing.  While 2019 novel coronavirus  (SARS-CoV-2) nucleic acids may be present in the submitted sample  additional confirmatory testing may be necessary for epidemiological  and / or clinical management purposes  to differentiate between  SARS-CoV-2 and other Sarbecovirus currently known to infect humans.  If clinically indicated additional testing with an alternate test  methodology [phone]([id number]) is advised. The SARS-CoV-2 RNA is generally  detectable in upper and lower respiratory sp ecimens during the acute  phase of infection. The expected result is Negative. Fact Sheet for Patients:  BoilerBrush.com.cyhttps://www.fda.gov/media/136312/download Fact Sheet for Healthcare Providers: https://pope.com/https://www.fda.gov/media/136313/download This test is not yet approved or cleared by the [place] FDA and has been authorized for detection and/or diagnosis of SARS-CoV-2 by FDA under an Emergency Use Authorization (EUA).  This EUA will remain in effect (meaning this test can be used) for the duration of the COVID-19 declaration under Section 564(b)(1) of the Act, 21 U.S.C. section 360bbb-3(b)(1), unless the authorization is terminated or revoked sooner. Performed at [facility], [address] [place], [place] [address]   Blood Culture (routine x 2)     Status: None (Preliminary result)   Collection Time: 02/09/19 10:15 AM   Specimen: BLOOD  Result Value Ref Range Status   Specimen Description BLOOD LEFT ANTECUBITAL  Final   Special  Requests   Final    BOTTLES DRAWN AEROBIC AND ANAEROBIC Blood Culture results may not be optimal due to an inadequate volume of blood received in culture bottles   Culture   Final    NO GROWTH 1 DAY Performed at [facility], [address] [place], [place] [address]    Report Status PENDING  Incomplete  Blood Culture (routine x 2)     Status: None (Preliminary result)   Collection Time: 02/09/19 10:26 AM  Specimen: BLOOD  Result Value Ref Range Status   Specimen Description BLOOD RIGHT ANTECUBITAL  Final   Special Requests   Final    BOTTLES DRAWN AEROBIC ONLY Blood Culture adequate volume   Culture   Final    NO GROWTH 1 DAY Performed at [facility], [address] [place], [place] [address]    Report Status PENDING  Incomplete  Urine culture     Status: Abnormal   Collection Time: 02/09/19 10:54 AM   Specimen: Urine, Random  Result Value Ref Range Status   Specimen Description URINE, RANDOM  Final   Special Requests NONE  Final   Culture (A)  Final    <10,000 COLONIES/mL INSIGNIFICANT GROWTH Performed at [facility], [place] [address], [place], [place] [address]    Report Status 02/10/2019 FINAL  Final         Radiology Studies: Ct Angio Chest Pe W And/or Wo Contrast  Result Date: 02/09/2019 CLINICAL DATA:  Headache, sore throat, body aches, and productive cough for the past week. EXAM: CT ANGIOGRAPHY CHEST WITH CONTRAST TECHNIQUE: Multidetector CT imaging of the chest was performed using the standard protocol during bolus administration of intravenous contrast. Multiplanar CT image reconstructions and MIPs were obtained to evaluate the vascular anatomy. CONTRAST:  33mL OMNIPAQUE IOHEXOL 350 MG/ML SOLN COMPARISON:  Chest x-ray from same day. FINDINGS: Cardiovascular: Satisfactory opacification of the pulmonary arteries to the segmental level. Acute lobar and segmental pulmonary emboli primarily involving the left upper lobe with minimal clot extending  into the proximal left interlobar pulmonary artery and left lower lobe superior segmental pulmonary artery. Additional segmental and subsegmental pulmonary emboli in the right upper lobe anterior segment. Overall clot burden is small. Mild cardiomegaly with normal RV/LV ratio. Trace pericardial effusion. Aneurysmal ascending thoracic aorta measuring up to 4.1 cm. Coronary, aortic arch, and branch vessel atherosclerotic vascular disease. Mediastinum/Nodes: No enlarged mediastinal, hilar, or axillary lymph nodes. Mildly patulous esophagus. Thyroid gland and trachea demonstrate no significant findings. Lungs/Pleura: Minimal dependent subsegmental atelectasis in both lower lobes. Mild diffuse central peribronchial thickening. No focal consolidation, pleural effusion, or pneumothorax. No suspicious pulmonary nodule. Upper Abdomen: No acute abnormality. Musculoskeletal: No chest wall abnormality. No acute or significant osseous findings. Review of the MIP images confirms the above findings. IMPRESSION: 1. Acute bilateral pulmonary emboli primarily involving both upper lobes. Overall clot burden is small. No right heart strain. 2. Ascending thoracic aortic aneurysm measuring up to 4.1 cm. Recommend annual imaging followup by CTA or MRA. This recommendation follows 2010 ACCF/AHA/AATS/ACR/ASA/SCA/SCAI/SIR/STS/SVM Guidelines for the Diagnosis and Management of Patients with Thoracic Aortic Disease. Circulation. 2010; 121: H371-I967. Aortic aneurysm NOS (ICD10-I71.9) 3.  Aortic atherosclerosis (ICD10-I70.0). Critical Value/emergent results were called by telephone at the time of interpretation on 02/09/2019 at 5:06 pm to [facility], who verbally acknowledged these results. Electronically Signed   By: Titus Dubin M.D.   On: 02/09/2019 17:10   Dg Chest Port 1 View  Result Date: 02/09/2019 CLINICAL DATA:  Shortness of breath EXAM: PORTABLE CHEST 1 VIEW COMPARISON:  08/23/2011 FINDINGS: Cardiomegaly. No  confluent airspace opacities, effusions or edema. No acute bony abnormality. IMPRESSION: No active disease. Electronically Signed   By: Rolm Baptise M.D.   On: 02/09/2019 12:07   Vas [place] Lower Extremity Venous (dvt)  Result Date: 02/10/2019  Lower Venous Study Indications: Pulmonary embolism.  Anticoagulation: Heparin. Comparison Study: no prior Performing Technologist: June Leap RDMS, RVT  Examination Guidelines: A complete evaluation includes B-mode imaging,  spectral Doppler, color Doppler, and power Doppler as needed of all accessible portions of each vessel. Bilateral testing is considered an integral part of a complete examination. Limited examinations for reoccurring indications may be performed as noted.  +---------+---------------+---------+-----------+----------+--------------+  RIGHT     Compressibility Phasicity Spontaneity Properties Thrombus Aging  +---------+---------------+---------+-----------+----------+--------------+  CFV       Full            Yes       Yes                                    +---------+---------------+---------+-----------+----------+--------------+  SFJ       Full                                                             +---------+---------------+---------+-----------+----------+--------------+  FV Prox   Full                                                             +---------+---------------+---------+-----------+----------+--------------+  FV Mid    Full                                                             +---------+---------------+---------+-----------+----------+--------------+  FV Distal Full                                                             +---------+---------------+---------+-----------+----------+--------------+  PFV       Full                                                             +---------+---------------+---------+-----------+----------+--------------+  POP       Partial         Yes       Yes                    Acute            +---------+---------------+---------+-----------+----------+--------------+  PTV       Full                                                             +---------+---------------+---------+-----------+----------+--------------+  PERO      Full                                                             +---------+---------------+---------+-----------+----------+--------------+   +---------+---------------+---------+-----------+----------+--------------+  LEFT      Compressibility Phasicity Spontaneity Properties Thrombus Aging  +---------+---------------+---------+-----------+----------+--------------+  CFV       Full            Yes       Yes                                    +---------+---------------+---------+-----------+----------+--------------+  SFJ       Full                                                             +---------+---------------+---------+-----------+----------+--------------+  FV Prox   Full                                                             +---------+---------------+---------+-----------+----------+--------------+  FV Mid    Full                                                             +---------+---------------+---------+-----------+----------+--------------+  FV Distal Full                                                             +---------+---------------+---------+-----------+----------+--------------+  PFV       Full                                                             +---------+---------------+---------+-----------+----------+--------------+  POP       Partial         Yes       Yes                    Acute           +---------+---------------+---------+-----------+----------+--------------+  PTV       Full                                                             +---------+---------------+---------+-----------+----------+--------------+  PERO      None                                             Acute            +---------+---------------+---------+-----------+----------+--------------+  Summary: Right: Findings consistent with acute deep vein thrombosis involving the right popliteal vein. Left: Findings consistent with acute deep vein thrombosis involving the left popliteal vein, and left peroneal veins.  *See table(s) above for measurements and observations.    Preliminary       Scheduled Meds:  amiodarone  400 mg Oral Q12H   Followed by   Melene Muller ON 02/17/2019] amiodarone  200 mg Oral Daily   amiodarone  200 mg Oral Once   amLODipine  10 mg Oral Daily   isosorbide mononitrate  60 mg Oral Daily   lisinopril  40 mg Oral Daily   metoprolol tartrate  25 mg Oral BID   potassium chloride  40 mEq Oral Once   tamsulosin  0.4 mg Oral Daily   Continuous Infusions:  heparin 1,200 Units/hr (02/10/19 1023)     LOS: 0 days      Calvert Cantor, MD [facility] Pager: [email or web address] Password [facility] 02/10/2019, 12:29 PM

## 2019-02-10 NOTE — Progress Notes (Signed)
Progress Note  Patient Name: Jesus Morrison Date of Encounter: 02/10/2019  Primary Cardiologist: No primary care provider on file.   Subjective   Denies any chest pain.  SOB improved.  Remains in afib  Inpatient Medications    Scheduled Meds: . amiodarone  200 mg Oral Q12H   Followed by  . [START ON 02/17/2019] amiodarone  200 mg Oral Daily  . amLODipine  10 mg Oral Daily  . hydrochlorothiazide  25 mg Oral Daily  . isosorbide mononitrate  60 mg Oral Daily  . lisinopril  40 mg Oral Daily  . metoprolol tartrate  25 mg Oral BID  . tamsulosin  0.4 mg Oral Daily   Continuous Infusions: . heparin 1,200 Units/hr (02/10/19 0652)   PRN Meds: lactulose   Vital Signs    Vitals:   02/09/19 2028 02/09/19 2240 02/10/19 0021 02/10/19 0400  BP: (!) 171/90 (!) 164/70 124/70 113/71  Pulse: 80 (!) 52 (!) 52 (!) 58  Resp: 16  14 14   Temp: 98.4 F (36.9 C)  97.9 F (36.6 C) 98.2 F (36.8 C)  TempSrc: Oral  Oral Oral  SpO2: 100% 98% 100% 99%  Weight: 74.3 kg     Height: 5\' 5"  (1.651 m)       Intake/Output Summary (Last 24 hours) at 02/10/2019 0726 Last data filed at 02/10/2019 02/12/2019 Gross per 24 hour  Intake 158.45 ml  Output 200 ml  Net -41.55 ml   Filed Weights   02/09/19 0936 02/09/19 2028  Weight: 72.6 kg 74.3 kg    Telemetry    Atrial fibrillation - Personally Reviewed  ECG    No new EKG to review - Personally Reviewed  Physical Exam   GEN: No acute distress.   Neck: No JVD Cardiac: irregularly irregular, no murmurs, rubs, or gallops.  Respiratory: Clear to auscultation bilaterally. GI: Soft, nontender, non-distended  MS: No edema; No deformity. Neuro:  Nonfocal  Psych: Normal affect   Labs    Chemistry Recent Labs  Lab 02/09/19 0950 02/09/19 1040 02/10/19 0235  NA 140 141 138  K 3.8 3.6 3.6  CL 104 104 104  CO2 24  --  24  GLUCOSE 140* 138* 111*  BUN 18 21 13   CREATININE 1.00 0.80 0.85  CALCIUM 9.2  --  8.5*  PROT 7.1  --   --   ALBUMIN  3.7  --   --   AST 35  --   --   ALT 39  --   --   ALKPHOS 53  --   --   BILITOT 1.1  --   --   GFRNONAA >60  --  >60  GFRAA >60  --  >60  ANIONGAP 12  --  10     Hematology Recent Labs  Lab 02/09/19 0950 02/09/19 1040 02/10/19 0235  WBC 8.4  --  8.5  RBC 4.75  --  4.70  HGB 13.7 14.6 13.5  HCT 42.9 43.0 41.4  MCV 90.3  --  88.1  MCH 28.8  --  28.7  MCHC 31.9  --  32.6  RDW 13.2  --  13.2  PLT 193  --  193    Cardiac EnzymesNo results for input(s): TROPONINI in the last 168 hours. No results for input(s): TROPIPOC in the last 168 hours.   BNP Recent Labs  Lab 02/09/19 0956  BNP 400.8*     DDimer  Recent Labs  Lab 02/09/19 0950  DDIMER 2.81*  Radiology    Ct Angio Chest Pe W And/or Wo Contrast  Result Date: 02/09/2019 CLINICAL DATA:  Headache, sore throat, body aches, and productive cough for the past week. EXAM: CT ANGIOGRAPHY CHEST WITH CONTRAST TECHNIQUE: Multidetector CT imaging of the chest was performed using the standard protocol during bolus administration of intravenous contrast. Multiplanar CT image reconstructions and MIPs were obtained to evaluate the vascular anatomy. CONTRAST:  75mL OMNIPAQUE IOHEXOL 350 MG/ML SOLN COMPARISON:  Chest x-ray from same day. FINDINGS: Cardiovascular: Satisfactory opacification of the pulmonary arteries to the segmental level. Acute lobar and segmental pulmonary emboli primarily involving the left upper lobe with minimal clot extending into the proximal left interlobar pulmonary artery and left lower lobe superior segmental pulmonary artery. Additional segmental and subsegmental pulmonary emboli in the right upper lobe anterior segment. Overall clot burden is small. Mild cardiomegaly with normal RV/LV ratio. Trace pericardial effusion. Aneurysmal ascending thoracic aorta measuring up to 4.1 cm. Coronary, aortic arch, and branch vessel atherosclerotic vascular disease. Mediastinum/Nodes: No enlarged mediastinal, hilar, or  axillary lymph nodes. Mildly patulous esophagus. Thyroid gland and trachea demonstrate no significant findings. Lungs/Pleura: Minimal dependent subsegmental atelectasis in both lower lobes. Mild diffuse central peribronchial thickening. No focal consolidation, pleural effusion, or pneumothorax. No suspicious pulmonary nodule. Upper Abdomen: No acute abnormality. Musculoskeletal: No chest wall abnormality. No acute or significant osseous findings. Review of the MIP images confirms the above findings. IMPRESSION: 1. Acute bilateral pulmonary emboli primarily involving both upper lobes. Overall clot burden is small. No right heart strain. 2. Ascending thoracic aortic aneurysm measuring up to 4.1 cm. Recommend annual imaging followup by CTA or MRA. This recommendation follows 2010 ACCF/AHA/AATS/ACR/ASA/SCA/SCAI/SIR/STS/SVM Guidelines for the Diagnosis and Management of Patients with Thoracic Aortic Disease. Circulation. 2010; 121: Z610-R604: E266-e369. Aortic aneurysm NOS (ICD10-I71.9) 3.  Aortic atherosclerosis (ICD10-I70.0). Critical Value/emergent results were called by telephone at the time of interpretation on 02/09/2019 at 5:06 pm to [facility]provider[facility]ATHAN PICKERING, who verbally acknowledged these results. Electronically Signed   By: Obie DredgeWilliam T Derry M.D.   On: 02/09/2019 17:10   Dg Chest Port 1 View  Result Date: 02/09/2019 CLINICAL DATA:  Shortness of breath EXAM: PORTABLE CHEST 1 VIEW COMPARISON:  08/23/2011 FINDINGS: Cardiomegaly. No confluent airspace opacities, effusions or edema. No acute bony abnormality. IMPRESSION: No active disease. Electronically Signed   By: Charlett NoseKevin  Dover M.D.   On: 02/09/2019 12:07    Cardiac Studies   2D echo 02/09/2019 IMPRESSIONS    1. Left ventricular ejection fraction, by visual estimation, is 60 to 65%. The left ventricle has normal function. Normal left ventricular size. There is moderately increased left ventricular hypertrophy.  2. Global right ventricle has normal systolic  function.The right ventricular size is normal. Mildly increased right ventricular wall thickness.  3. Left atrial size was severely dilated.  4. Right atrial size was mildly dilated.  5. Small pericardial effusion.  6. The pericardial effusion is circumferential.  7. The mitral valve is normal in structure. Trace mitral valve regurgitation.  8. The tricuspid valve is normal in structure. Tricuspid valve regurgitation is mild.  9. The aortic valve is normal in structure. Aortic valve regurgitation is mild by color flow Doppler. Mild aortic valve sclerosis without stenosis. 10. The pulmonic valve was not well visualized. Pulmonic valve regurgitation is not visualized by color flow Doppler. 11. There is mild dilatation of the ascending aorta measuring 39 mm. 12. Mildly elevated pulmonary artery systolic pressure. 13. The inferior vena cava is dilated in size with <50% respiratory variability, suggesting  right atrial pressure of 15 mmHg.   Patient Profile     85 y.o. male with a hx of HTN and BPH.  Admitted with SOB and VT/afib.  Found to have bilateral upper lobe PEs  Assessment & Plan    1. Wide-complex tachycardia -consistent with ventricular tachycardia -appreciate EP evaluation - felt to have RVOT VT in setting of acute PE -LVF normal on echo -now on IV Amio -transition to oral Amio today -continue Lopressor 25mg  BID -no indication for ICD  2.  New onset atrial fibrillation -2D echo with severe Biatrial enlargement and likely not to hold in NSR -plan for rate control -CHADSCASCs score of 2 for HTN and Age.  -Heparin gtt for PE and transition to Eliquis  3. Elevated D-dimer -chest CT with bilateral upper lobe pulmonary emboli -no right heart strain on echo -Per admitting team - on IV Heparin gtt  4.  Hypertension -BP remains stable -continue Lisinopril 40mg  daily, HCTZ 25mg  daily and amlodipine 10mg  daily and Lopressor      For questions or updates, please contact  [place] Please consult [email or web address] for contact info under Cardiology/STEMI.      Signed, Fransico Him, MD  02/10/2019, 7:26 AM

## 2019-02-10 NOTE — Progress Notes (Signed)
ANTICOAGULATION CONSULT NOTE - Follow Up Consult  Pharmacy Consult for Heparin Indication: pulmonary embolus 10/27  Pharmacy Consult for Heparin No Known Allergies  Patient Measurements: Height: 5\' 5"  (165.1 cm) Weight: 163 lb 14.4 oz (74.3 kg) IBW/kg (Calculated) : 61.5 Heparin Dosing Weight: 72.6 kg  Vital Signs: Temp: 98.3 F (36.8 C) (10/28 1100) Temp Source: Oral (10/28 1100) BP: 112/75 (10/28 1100) Pulse Rate: 39 (10/28 1100)  Labs: Recent Labs    02/09/19 0950 02/09/19 1009 02/09/19 1040 02/09/19 1151 02/10/19 0235 02/10/19 1120  HGB 13.7  --  14.6  --  13.5  --   HCT 42.9  --  43.0  --  41.4  --   PLT 193  --   --   --  193  --   HEPARINUNFRC  --   --   --   --  0.67 0.75*  CREATININE 1.00  --  0.80  --  0.85  --   TROPONINIHS  --  34*  --  42*  --   --     Estimated Creatinine Clearance: 54.4 mL/min (by C-G formula based on SCr of 0.85 mg/dL).   Assessment: Patient is a 85 yo M with V-tach, Afib and new BL PE, started on heparin 10/27.  Second HL slightly subpratherapeutic, >12 hr from bolus. Will decrease rate. Slight drop in H/H, no documented bleeding, plt stable.   Goal of Therapy:  Heparin level 0.3-0.7 units/ml Monitor platelets by anticoagulation protocol: Yes   Plan:  -Decrease heparin to 1100 units/hr -Monitor daily HL, CBC, plt -Monitor for signs/symptoms of bleeding    Benetta Spar, PharmD, BCPS, BCCP Clinical Pharmacist  Please check AMION for all [place] phone numbers After 10:00 PM, call [place]

## 2019-02-10 NOTE — Progress Notes (Signed)
LE venous duplex       has been completed. Preliminary results can be found under CV proc through chart review. Kassim Guertin, BS, RDMS, RVT   

## 2019-02-10 NOTE — Progress Notes (Signed)
ANTICOAGULATION CONSULT NOTE   Pharmacy Consult for Heparin Indication: pulmonary embolus  No Known Allergies  Patient Measurements: Height: 5\' 5"  (165.1 cm) Weight: 163 lb 14.4 oz (74.3 kg) IBW/kg (Calculated) : 61.5 Heparin Dosing Weight: 72.6kg  Vital Signs: Temp: 97.9 F (36.6 C) (10/28 0021) Temp Source: Oral (10/28 0021) BP: 124/70 (10/28 0021) Pulse Rate: 52 (10/28 0021)  Labs: Recent Labs    02/09/19 0950 02/09/19 1009 02/09/19 1040 02/09/19 1151 02/10/19 0235  HGB 13.7  --  14.6  --  13.5  HCT 42.9  --  43.0  --  41.4  PLT 193  --   --   --  193  HEPARINUNFRC  --   --   --   --  0.67  CREATININE 1.00  --  0.80  --  0.85  TROPONINIHS  --  34*  --  42*  --     Estimated Creatinine Clearance: 54.4 mL/min (by C-G formula based on SCr of 0.85 mg/dL).   Medical History: Past Medical History:  Diagnosis Date  . Arthritis    fingers and hands  . BPH (benign prostatic hypertrophy)    problems with acute urinary retention- has indwelling foley catheter  . Cold (disease)    last week ( week of Dec 22- Dec 29 ) - no longer coughing, no fever and feels better now  . Enlarged prostate   . Hypertension     Medications:  Scheduled:  . amiodarone  200 mg Oral Q12H   Followed by  . [START ON 02/17/2019] amiodarone  200 mg Oral Daily  . amLODipine  10 mg Oral Daily  . hydrochlorothiazide  25 mg Oral Daily  . isosorbide mononitrate  60 mg Oral Daily  . lisinopril  40 mg Oral Daily  . metoprolol tartrate  25 mg Oral BID  . tamsulosin  0.4 mg Oral Daily    Assessment:  Patient is a 43 yom that presented with shortness of breath. While in the ED the patient had intermittent episodes of V-tach. Patient was also found to have a bilateral PE.   10/28 AM update:  Initial heparin level therapeutic   Goal of Therapy:  Heparin level 0.3-0.7 units/ml Monitor platelets by anticoagulation protocol: Yes   Plan:  -Cont heparin at 1200 units/hr -Re-check heparin level  at [place], PharmD, [place] Pharmacist Phone: [phone]

## 2019-02-10 NOTE — Progress Notes (Signed)
Patient noted to have converted to SB with HR of 38-54.  He is asymptomatic, though continues to remain confused and does not listen to safety reasoning. Film/video editor for safety. V/S stable.  Dr. Wynelle Cleveland aware of conversion.  Will continue to monitor patient.

## 2019-02-10 NOTE — Consult Note (Signed)
NAME:  Jesus Morrison, MRN:  [id number], DOB:  Aug 26, 1926, LOS: 0 ADMISSION DATE:  02/09/2019, CONSULTATION DATE:  10/28 REFERRING MD:  Wynelle Cleveland, CHIEF COMPLAINT:  Pulmonary emboli   Brief History   85 year old male admitted on 10/27 with chief complaint of shortness of breath, CT angiogram demonstrated small bilateral pulmonary emboli without evidence of RV dysfunction.  Echocardiogram negative for evidence of RV strain. LEUS later identified BLE DVT.  Had new finding of atrial fibrillation with RVR as well as episodes of ventricular tachycardia, all felt to be secondary to strain from acute pulmonary emboli  Has remained in atrial fibrillation Cardiology evaluated Pulmonary asked to evaluate regarding: Possible need for thrombolysis  History of present illness   85 year old male patient who was admitted on 10/27 With chief complaint of shortness of breath, Right lower extremity leg pain and cough.  He had denied sick exposure, fever, chills, nausea vomiting or chest discomfort.  In the emergency room he was found to be in atrial fibrillation and associated rapid ventricular response he also had witnessed episodes of ventricular tachycardia.  He was started on IV amiodarone, D-dimer was elevated which triggered CT angiogram.  This showed small bilateral pulmonary emboli in the upper lobe without evidence of heart strain.  He was seen by cardiology who felt VT was due to acute illness from the pulmonary emboli.  Follow-up echocardiogram showed preserved ejection fraction, the right atrium was mildly dilated the left atrium was severely dilated he had normal right ventricular size and function.  He has been placed on heparin shortly after CT findings on 10/27.  Since admission his shortness of breath is improved, however pulmonary was consulted on 10/28 to consider either catheter or systemic thrombolysis given initial ventricular tachycardia felt to be secondary to strain of acute illness  Past Medical  History  Hypertension, BPH,  Significant Hospital Events   10/27 admitted.  Diagnosed with bilateral pulmonary emboli, atrial fibrillation with rapid ventricular response and episodes of ventricular tachycardia.  sPESI score: 1  Placed on IV heparin, placed on amiodarone.  EP evaluated.  Ventricular tachycardia felt due to acute illness 10/28 pulm consulted   Consults:  Pulm   Procedures:   Significant Diagnostic Tests:  CT chest 10/27: 1. Acute bilateral pulmonary emboli primarily involving both upper lobes. Overall clot burden is small. No right heart strain.  ECHO:  . Left ventricular ejection fraction, by visual estimation, is 60 to 65%. The left ventricle has normal function. Normal left ventricular size. There is moderately increased left ventricular hypertrophy.  2. Global right ventricle has normal systolic function.The right ventricular size is normal. Mildly increased right ventricular wall thickness.  3. Left atrial size was severely dilated.  4. Right atrial size was mildly dilated.  5. Small pericardial effusion.  6. The pericardial effusion is circumferential.  7. The mitral valve is normal in structure. Trace mitral valve regurgitation.  8. The tricuspid valve is normal in structure. Tricuspid valve regurgitation is mild.  9. The aortic valve is normal in structure. Aortic valve regurgitation is mild by color flow Doppler. Mild aortic valve sclerosis without stenosis. 10. The pulmonic valve was not well visualized. Pulmonic valve regurgitation is not visualized by color flow Doppler. 11. There is mild dilatation of the ascending aorta measuring 39 mm. Lower extremity vascular ultrasound obtained on 10/28: Acute DVT involving right popliteal vein acute DVT involving left popliteal vein and left peroneal veins  Micro Data:    Antimicrobials:     Interim history/subjective:  Feels much better.  Denied shortness of breath Objective   Blood pressure 112/75, pulse  (Abnormal) 39, temperature 98.3 F (36.8 C), temperature source Oral, resp. rate (Abnormal) 24, height 5\' 5"  (1.651 m), weight 74.3 kg, SpO2 100 %.        Intake/Output Summary (Last 24 hours) at 02/10/2019 1311 Last data filed at 02/10/2019 0800 Gross per 24 hour  Intake 398.45 ml  Output 375 ml  Net 23.45 ml   Filed Weights   02/09/19 0936 02/09/19 2028  Weight: 72.6 kg 74.3 kg    Examination: General: Very pleasant [age] year old black male he is resting in bed he is currently in no acute distress HENT: Normocephalic atraumatic no jugular venous distention is appreciated Lungs: Clear to auscultation without accessory use no wheezing no rhonchi Cardiovascular: Regular irregular with atrial fibrillation noted on telemetry.  Review of telemetry monitor shows occasional multifocal ventricular arrhythmias.  He did have a prolonged course of ventricular tachycardia versus wide-complex tachycardia earlier this a.m., the frequency of these arrhythmias seem to have improved over the course of the day Abdomen: Soft nontender Extremities: Warm dry.  Does appear to be some mild right lower extremity edema when comparing to the left Neuro: Awake oriented no focal deficit GU: Voids  Resolved Hospital Problem list     Assessment & Plan:   Unprovoked bilateral pulmonary emboli with bilateral lower extremity DVT -sPESI score 1 so this would place him in high risk group justifying hospitalization with potential mortality of 8.9% -no evidence of RV strain either by CT or echocardiogram -Currently not requiring any oxygen -Dyspnea has improved  Plan/recommendation Would proceed with transition to oral anticoagulation No need for thrombolysis either catheter directed or systemic Would check ambulatory pulse oximetry prior to discharge As he also has atrial fibrillation there is no need for further coagulopathy testing as he will require lifelong anticoagulation for his atrial arrhythmia  New  atrial fibrillation with RVR and recorded episodes of ventricular tachycardia as well as frequent PVCs -Although arrhythmia may indeed be secondary to acute illness this would not be an indication for thrombolytic therapy -Appreciate cardiology assistance with rate control -Suspect as patient recovers arrhythmia issues should improve Plan Continuing anticoagulation Rate control per cardiology (now on amiodarone & lopressor)  HTN Plan Cont HCTZ, lisinopril, and norvasc.    We will sign off please call if needed  Labs   CBC: Recent Labs  Lab 02/09/19 0950 02/09/19 1040 02/10/19 0235  WBC 8.4  --  8.5  NEUTROABS 6.7  --   --   HGB 13.7 14.6 13.5  HCT 42.9 43.0 41.4  MCV 90.3  --  88.1  PLT 193  --  193    Basic Metabolic Panel: Recent Labs  Lab 02/09/19 0950 02/09/19 1009 02/09/19 1040 02/10/19 0235  NA 140  --  141 138  K 3.8  --  3.6 3.6  CL 104  --  104 104  CO2 24  --   --  24  GLUCOSE 140*  --  138* 111*  BUN 18  --  21 13  CREATININE 1.00  --  0.80 0.85  CALCIUM 9.2  --   --  8.5*  MG  --  2.0  --   --    GFR: Estimated Creatinine Clearance: 54.4 mL/min (by C-G formula based on SCr of 0.85 mg/dL). Recent Labs  Lab 02/09/19 0950 02/09/19 1025 02/09/19 1151 02/10/19 0235  PROCALCITON <0.10  --   --   --  WBC 8.4  --   --  8.5  LATICACIDVEN  --  1.9 1.4  --     Liver Function Tests: Recent Labs  Lab 02/09/19 0950  AST 35  ALT 39  ALKPHOS 53  BILITOT 1.1  PROT 7.1  ALBUMIN 3.7   No results for input(s): LIPASE, AMYLASE in the last 168 hours. No results for input(s): AMMONIA in the last 168 hours.  ABG    Component Value Date/Time   TCO2 25 02/09/2019 1040     Coagulation Profile: No results for input(s): INR, PROTIME in the last 168 hours.  Cardiac Enzymes: No results for input(s): CKTOTAL, CKMB, CKMBINDEX, TROPONINI in the last 168 hours.  HbA1C: No results found for: HGBA1C  CBG: No results for input(s): GLUCAP in the last  168 hours.  Review of Systems:   Review of Systems  Constitutional: Negative for chills, fever, malaise/fatigue and weight loss.  HENT: Negative.   Eyes: Negative.   Respiratory: Positive for cough and shortness of breath.   Cardiovascular: Positive for leg swelling. Negative for chest pain.  Gastrointestinal: Negative.   Genitourinary: Negative.   Musculoskeletal: Negative.   Skin: Negative.   Neurological: Negative.   Endo/Heme/Allergies: Negative.   Psychiatric/Behavioral: Negative.      Past Medical History  He,  has a past medical history of Arthritis, BPH (benign prostatic hypertrophy), Cold (disease), Enlarged prostate, and Hypertension.   Surgical History    Past Surgical History:  Procedure Laterality Date  .  Right elbow surgery as a child    . PROSTATECTOMY  04/17/2012   Procedure: PROSTATECTOMY RETROPUBIC;  Surgeon: Lindaann Slough, MD;  Location: [facility];  Service: Urology;  Laterality: N/A;  Simple Retropubic Prostatectomy       Social History   reports that he has never smoked. He has never used smokeless tobacco. He reports current alcohol use of about 4.0 - 6.0 standard drinks of alcohol per week. He reports that he does not use drugs.   Family History   His family history includes Heart disease in his brother; Liver disease in his father.   Allergies No Known Allergies   Home Medications  Prior to Admission medications   Medication Sig Start Date End Date Taking? Authorizing Provider  amLODipine (NORVASC) 10 MG tablet Take 1 tablet (10 mg total) by mouth daily. 11/23/18  Yes Hoy Register, MD  isosorbide mononitrate (IMDUR) 60 MG 24 hr tablet Take 1 tablet (60 mg total) by mouth daily. 11/23/18  Yes Hoy Register, MD  lactulose (CHRONULAC) 10 GM/15ML solution Take 15 mLs (10 g total) by mouth 2 (two) times daily as needed for mild constipation. 11/23/18  Yes Hoy Register, MD  lisinopril-hydrochlorothiazide (ZESTORETIC) 20-12.5 MG tablet Take 2  tablets by mouth daily. 11/23/18  Yes Hoy Register, MD  tamsulosin (FLOMAX) 0.4 MG CAPS capsule Take 1 capsule (0.4 mg total) by mouth daily. 11/23/18  Yes Hoy Register, MD  cetirizine (ZYRTEC) 10 MG tablet Take 1 tablet (10 mg total) by mouth daily. Patient not taking: Reported on 02/09/2019 02/12/18   Hoy Register, MD  hydrocortisone cream 0.5 % Apply 1 application topically 2 (two) times daily. Patient not taking: Reported on 02/09/2019 05/16/17   Hoy Register, MD  Misc. Devices MISC Blood Pressure monitor Dx: Hypertension 08/19/18   Hoy Register, MD  polyethylene glycol (MIRALAX / GLYCOLAX) packet Take 17 g by mouth daily. Patient not taking: Reported on 02/09/2019 09/19/17   Dietrich Pates, PA-C  triamcinolone cream (KENALOG) 0.1 %  Apply 1 application topically 2 (two) times daily. Patient not taking: Reported on 02/09/2019 05/20/18   Hoy Register, MD     Critical care time: na     Simonne Martinet ACNP-BC [facility] Pulmonary/Critical Care Pager # [phone] OR # [phone] if no answer

## 2019-02-10 NOTE — Progress Notes (Signed)
Patient ambulated to BR for stool. On way back to bed, had 40+ beats SVT with rate of 250 x 4. B/P 828'M systolic. Is now 91/63 with HR of 89. Asymptomatic Dr. Wynelle Cleveland notified.  Cariology notified as well.  Po Amiodarone and metoprolol given.  Awaiting return call.

## 2019-02-10 NOTE — Progress Notes (Signed)
Tele reviewed.   Pt remains in rate controlled AF in 60-70s.   Continues to have PVCs and occasional NSVT (longest 3-4 beats). No sustained VT.   Continue to titrate toprol as needed for rate control.  Continue amiodarone. Agree with orders to transition to po amiodarone today.  Electrophysiology team to see as needed while here. Please call with questions.   [address], [place]  Pager: [phone]  02/10/2019 7:12 AM

## 2019-02-11 DIAGNOSIS — I472 Ventricular tachycardia: Secondary | ICD-10-CM | POA: Diagnosis not present

## 2019-02-11 DIAGNOSIS — I4891 Unspecified atrial fibrillation: Secondary | ICD-10-CM | POA: Diagnosis not present

## 2019-02-11 DIAGNOSIS — I2699 Other pulmonary embolism without acute cor pulmonale: Secondary | ICD-10-CM | POA: Diagnosis not present

## 2019-02-11 LAB — BASIC METABOLIC PANEL
Anion gap: 8 (ref 5–15)
BUN: 25 mg/dL — ABNORMAL HIGH (ref 8–23)
CO2: 23 mmol/L (ref 22–32)
Calcium: 8.3 mg/dL — ABNORMAL LOW (ref 8.9–10.3)
Chloride: 107 mmol/L (ref 98–111)
Creatinine, Ser: 1.15 mg/dL (ref 0.61–1.24)
GFR calc Af Amer: >60 mL/min (ref >60)
GFR calc non Af Amer: 56 mL/min — ABNORMAL LOW (ref >60)
Glucose, Bld: 101 mg/dL — ABNORMAL HIGH (ref 70–99)
Potassium: 4.3 mmol/L (ref 3.5–5.1)
Sodium: 138 mmol/L (ref 135–145)

## 2019-02-11 LAB — HEPARIN LEVEL (UNFRACTIONATED): Heparin Unfractionated: 0.55 IU/mL (ref 0.30–0.70)

## 2019-02-11 LAB — CBC
HCT: 39.4 % (ref 39.0–52.0)
Hemoglobin: 13.1 g/dL (ref 13.0–17.0)
MCH: 29.3 pg (ref 26.0–34.0)
MCHC: 33.2 g/dL (ref 30.0–36.0)
MCV: 88.1 fL (ref 80.0–100.0)
Platelets: 186 10*3/uL (ref 150–400)
RBC: 4.47 MIL/uL (ref 4.22–5.81)
RDW: 13.2 % (ref 11.5–15.5)
WBC: 8 10*3/uL (ref 4.0–10.5)
nRBC: 0 % (ref 0.0–0.2)

## 2019-02-11 MED ORDER — ISOSORBIDE MONONITRATE ER 30 MG PO TB24
30.0000 mg | ORAL_TABLET | Freq: Every day | ORAL | Status: DC
  Administered 2019-02-12: 30 mg via ORAL
  Filled 2019-02-11: qty 1

## 2019-02-11 MED ORDER — AMIODARONE HCL 200 MG PO TABS: 200.0000 mg | ORAL_TABLET | Freq: Every day | ORAL | Status: DC

## 2019-02-11 MED ORDER — APIXABAN 5 MG PO TABS: 5.0000 mg | ORAL_TABLET | Freq: Two times a day (BID) | ORAL | Status: DC

## 2019-02-11 MED ORDER — LISINOPRIL 10 MG PO TABS: 20.0000 mg | ORAL_TABLET | Freq: Every day | ORAL | Status: DC

## 2019-02-11 MED ORDER — AMLODIPINE BESYLATE 5 MG PO TABS
5.0000 mg | ORAL_TABLET | Freq: Every day | ORAL | Status: DC
  Administered 2019-02-12: 5 mg via ORAL
  Filled 2019-02-11: qty 1

## 2019-02-11 MED ORDER — AMIODARONE HCL 200 MG PO TABS
200.0000 mg | ORAL_TABLET | Freq: Two times a day (BID) | ORAL | Status: DC
  Administered 2019-02-11 – 2019-02-12 (×2): 200 mg via ORAL
  Filled 2019-02-11 (×2): qty 1

## 2019-02-11 MED ORDER — APIXABAN 5 MG PO TABS
10.0000 mg | ORAL_TABLET | Freq: Two times a day (BID) | ORAL | Status: DC
  Administered 2019-02-11 – 2019-02-12 (×3): 10 mg via ORAL
  Filled 2019-02-11: qty 4
  Filled 2019-02-11 (×2): qty 2

## 2019-02-11 NOTE — Progress Notes (Signed)
ANTICOAGULATION CONSULT NOTE - Follow Up Consult  Pharmacy Consult for Heparin > Eliquis Indication: pulmonary embolus and DVT  No Known Allergies  Patient Measurements: Height: 5\' 5"  (165.1 cm) Weight: 163 lb 14.4 oz (74.3 kg) IBW/kg (Calculated) : 61.5 Heparin Dosing Weight: 72.6 kg  Vital Signs: Temp: 98.2 F (36.8 C) (10/29 0800) Temp Source: Oral (10/29 0800) BP: 165/64 (10/29 0800) Pulse Rate: 57 (10/29 0824)  Labs: Recent Labs    02/09/19 0950 02/09/19 1009 02/09/19 1040 02/09/19 1151 02/10/19 0235 02/10/19 1120 02/11/19 0316  HGB 13.7  --  14.6  --  13.5  --  13.1  HCT 42.9  --  43.0  --  41.4  --  39.4  PLT 193  --   --   --  193  --  186  HEPARINUNFRC  --   --   --   --  0.67 0.75* 0.55  CREATININE 1.00  --  0.80  --  0.85  --  1.15  TROPONINIHS  --  34*  --  42*  --   --   --     Estimated Creatinine Clearance: 40.2 mL/min (by C-G formula based on SCr of 1.15 mg/dL).  Assessment:  85 yr old male with new bilateral PE and DVT and also atrial fibrillation.  IV heparin begun on 10/27, to transition to Eliquis today.    Heparin level is therapeutic (0.46) on 1100 units/hr.  CBC stable.   Creatinine trending up some, but remains <1.5  Goal of Therapy:  Heparin level 0.3-0.7 units/ml appropriate Eliquis dose for indication Monitor platelets by anticoagulation protocol: Yes   Plan:   Eliquis 10 mg BID x 7 days, then 5 mg BID.  Stop IV heparin when giving first Eliquis dose.    Arty Baumgartner, [place] Pager: [phone] or phone: [phone] 02/11/2019,8:37 AM

## 2019-02-11 NOTE — Progress Notes (Addendum)
PROGRESS NOTE    Jesus Morrison   ZOX:[id number]  DOB: 1926/06/05  DOA: 02/09/2019 PCP: Hoy Register, MD   Brief Narrative:  Jesus Morrison is a 85 year old male with BPH, hypertension who woke up in the middle of the night suddenly short of breath.  He also noted sore throat and a cough.  In the ED he was noted to have A. fib with RVR and then developed V. Tach. He was started on an amiodarone infusion.  Urology was consulted.  A CTA was done which revealed pulmonary emboli.  He was started on IV heparin. COVID-19 was negative.  Subjective: The patient has no complaints at this time.     Assessment & Plan:   Principal Problem:   Bilateral pulmonary embolism - Acute bilateral pulmonary emboli primarily involving both upper lobes. Overall clot burden is small - vascular ultrasound LE> deep vein thrombosis involving the right popliteal vein, left popliteal vein, and left peroneal veins. - Cardiology feels recurrent V tach is due to PEs  - appreciate PCCM input - change Heparin to Eliquis today- check ambulatory pulse ox  Active Problems:     A-fib /   V tach with mildly elevated troponin as a result - check TSH - ECHO shows LVH - has had many runs of V tach- last were yesterday - on Amiodarone per cardiology- IV changed to oral- received another loading dose orally on 10/28 - he converted to sinus brady later on 10/28 - today Amio is being weaned down to 200- I have communicated with Dr Mayford Knife who feels he needs to be watched overnight for recurrent V tach on this dose - cont to follow on telemetry- not a safe discharge yet. - keeping K > 4.0- given a dose of K dur     Hypotension Mod LVH and h/o HTN - has been receiving Norvasc, Lisinopril, Imdur (all precribed at home) and Toprol (new)  - all were held this AM as BP was 114/64- follow  HTN - cont Lisinopril  BPH -cont Flomax    Time spent in minutes: 40  DVT prophylaxis: Heparin infusion Code Status:  Full  code Family Communication:   Disposition Plan: cont to follow in progressive care Consultants:   Cardiology  EP  PCCM Procedures:   2 D ECHO 1. Left ventricular ejection fraction, by visual estimation, is 60 to 65%. The left ventricle has normal function. Normal left ventricular size. There is moderately increased left ventricular hypertrophy.  2. Global right ventricle has normal systolic function.The right ventricular size is normal. Mildly increased right ventricular wall thickness.  3. Left atrial size was severely dilated.  4. Right atrial size was mildly dilated.  5. Small pericardial effusion.  6. The pericardial effusion is circumferential.  7. The mitral valve is normal in structure. Trace mitral valve regurgitation.  8. The tricuspid valve is normal in structure. Tricuspid valve regurgitation is mild.  9. The aortic valve is normal in structure. Aortic valve regurgitation is mild by color flow Doppler. Mild aortic valve sclerosis without stenosis. 10. The pulmonic valve was not well visualized. Pulmonic valve regurgitation is not visualized by color flow Doppler. 11. There is mild dilatation of the ascending aorta measuring 39 mm. 12. Mildly elevated pulmonary artery systolic pressure. 13. The inferior vena cava is dilated in size with <50% respiratory variability, suggesting right atrial pressure of 15 mmHg. Antimicrobials:  Anti-infectives (From admission, onward)   None       Objective: Vitals:   02/10/19 2356  02/11/19 0800 02/11/19 0824 02/11/19 1240  BP: 114/64 (!) 165/64  117/81  Pulse: (!) 49 (!) 47 (!) 57 69  Resp: Temp: 97.7 F (36.5 C) 98.2 F (36.8 C)  97.7 F (36.5 C)  TempSrc: Oral Oral  Oral  SpO2: 99% 100%  100%  Weight:      Height:        Intake/Output Summary (Last 24 hours) at 02/11/2019 1321 Last data filed at 02/11/2019 1242 Gross per 24 hour  Intake 569.39 ml  Output 600 ml  Net -30.61 ml   Filed Weights   02/09/19  0936 02/09/19 2028  Weight: 72.6 kg 74.3 kg    Examination: General exam: Appears comfortable  HEENT: PERRLA, oral mucosa moist, no sclera icterus or thrush Respiratory system: Clear to auscultation. Respiratory effort normal. Cardiovascular system: S1 & S2 heard,  No murmurs  Gastrointestinal system: Abdomen soft, non-tender, nondistended. Normal bowel sounds   Central nervous system: Alert and oriented. No focal neurological deficits. Extremities: No cyanosis, clubbing or edema Skin: No rashes or ulcers Psychiatry:  Mood & affect appropriate.     Data Reviewed: I have personally reviewed following labs and imaging studies  CBC: Recent Labs  Lab 02/09/19 0950 02/09/19 1040 02/10/19 0235 02/11/19 0316  WBC 8.4  --  8.5 8.0  NEUTROABS 6.7  --   --   --   HGB 13.7 14.6 13.5 13.1  HCT 42.9 43.0 41.4 39.4  MCV 90.3  --  88.1 88.1  PLT 193  --  193 186   Basic Metabolic Panel: Recent Labs  Lab 02/09/19 0950 02/09/19 1009 02/09/19 1040 02/10/19 0235 02/11/19 0316  NA 140  --  141 138 138  K 3.8  --  3.6 3.6 4.3  CL 104  --  104 104 107  CO2 24  --   --  24 23  GLUCOSE 140*  --  138* 111* 101*  BUN 18  --  21 13 25*  CREATININE 1.00  --  0.80 0.85 1.15  CALCIUM 9.2  --   --  8.5* 8.3*  MG  --  2.0  --   --   --    GFR: Estimated Creatinine Clearance: 40.2 mL/min (by C-G formula based on SCr of 1.15 mg/dL). Liver Function Tests: Recent Labs  Lab 02/09/19 0950  AST 35  ALT 39  ALKPHOS 53  BILITOT 1.1  PROT 7.1  ALBUMIN 3.7   No results for input(s): LIPASE, AMYLASE in the last 168 hours. No results for input(s): AMMONIA in the last 168 hours. Coagulation Profile: No results for input(s): INR, PROTIME in the last 168 hours. Cardiac Enzymes: No results for input(s): CKTOTAL, CKMB, CKMBINDEX, TROPONINI in the last 168 hours. BNP (last 3 results) No results for input(s): PROBNP in the last 8760 hours. HbA1C: No results for input(s): HGBA1C in the last 72  hours. CBG: No results for input(s): GLUCAP in the last 168 hours. Lipid Profile: Recent Labs    02/09/19 1009  TRIG 39   Thyroid Function Tests: Recent Labs    02/10/19 1440  TSH 1.421   Anemia Panel: Recent Labs    02/09/19 0950  FERRITIN 76   Urine analysis:    Component Value Date/Time   COLORURINE YELLOW 02/09/2019 1025   APPEARANCEUR CLEAR 02/09/2019 1025   LABSPEC 1.018 02/09/2019 1025   PHURINE 5.0 02/09/2019 1025   GLUCOSEU NEGATIVE 02/09/2019 1025   HGBUR SMALL (A) 02/09/2019 1025  BILIRUBINUR NEGATIVE 02/09/2019 1025   BILIRUBINUR negative 11/07/2017 1139   KETONESUR NEGATIVE 02/09/2019 1025   PROTEINUR 30 (A) 02/09/2019 1025   UROBILINOGEN 1.0 11/07/2017 1139   UROBILINOGEN 0.2 04/14/2012 0900   NITRITE NEGATIVE 02/09/2019 1025   LEUKOCYTESUR NEGATIVE 02/09/2019 1025   Sepsis Labs: @LABRCNTIP (procalcitonin:4,lacticidven:4) ) Recent Results (from the past 240 hour(s))  SARS Coronavirus 2 by RT PCR (hospital order, performed in [facility]) Nasopharyngeal Nasopharyngeal Swab     Status: None   Collection Time: 02/09/19 10:14 AM   Specimen: Nasopharyngeal Swab  Result Value Ref Range Status   SARS Coronavirus 2 NEGATIVE NEGATIVE Final    Comment: (NOTE) If result is NEGATIVE SARS-CoV-2 target nucleic acids are NOT DETECTED. The SARS-CoV-2 RNA is generally detectable in upper and lower  respiratory specimens during the acute phase of infection. The lowest  concentration of SARS-CoV-2 viral copies this assay can detect is 250  copies / mL. A negative result does not preclude SARS-CoV-2 infection  and should not be used as the sole basis for treatment or other  patient management decisions.  A negative result may occur with  improper specimen collection / handling, submission of specimen other  than nasopharyngeal swab, presence of viral mutation(s) within the  areas targeted by this assay, and inadequate number of viral copies  (<250  copies / mL). A negative result must be combined with clinical  observations, patient history, and epidemiological information. If result is POSITIVE SARS-CoV-2 target nucleic acids are DETECTED. The SARS-CoV-2 RNA is generally detectable in upper and lower  respiratory specimens dur ing the acute phase of infection.  Positive  results are indicative of active infection with SARS-CoV-2.  Clinical  correlation with patient history and other diagnostic information is  necessary to determine patient infection status.  Positive results do  not rule out bacterial infection or co-infection with other viruses. If result is PRESUMPTIVE POSTIVE SARS-CoV-2 nucleic acids MAY BE PRESENT.   A presumptive positive result was obtained on the submitted specimen  and confirmed on repeat testing.  While 2019 novel coronavirus  (SARS-CoV-2) nucleic acids may be present in the submitted sample  additional confirmatory testing may be necessary for epidemiological  and / or clinical management purposes  to differentiate between  SARS-CoV-2 and other Sarbecovirus currently known to infect humans.  If clinically indicated additional testing with an alternate test  methodology [phone] is advised. The SARS-CoV-2 RNA is generally  detectable in upper and lower respiratory sp ecimens during the acute  phase of infection. The expected result is Negative. Fact Sheet for Patients:  BoilerBrush.com.cyhttps://www.fda.gov/media/136312/download Fact Sheet for Healthcare Providers: https://pope.com/https://www.fda.gov/media/136313/download This test is not yet approved or cleared by the [place] FDA and has been authorized for detection and/or diagnosis of SARS-CoV-2 by FDA under an Emergency Use Authorization (EUA).  This EUA will remain in effect (meaning this test can be used) for the duration of the COVID-19 declaration under Section 564(b)(1) of the Act, 21 U.S.C. section 360bbb-3(b)(1), unless the authorization is terminated or revoked  sooner. Performed at [facility], [address] [place], [place] [address]   Blood Culture (routine x 2)     Status: None (Preliminary result)   Collection Time: 02/09/19 10:15 AM   Specimen: BLOOD  Result Value Ref Range Status   Specimen Description BLOOD LEFT ANTECUBITAL  Final   Special Requests   Final    BOTTLES DRAWN AEROBIC AND ANAEROBIC Blood Culture results may not be optimal due to  an inadequate volume of blood received in culture bottles   Culture   Final    NO GROWTH 2 DAYS Performed at [facility], [address] [place], [place] [address]    Report Status PENDING  Incomplete  Blood Culture (routine x 2)     Status: None (Preliminary result)   Collection Time: 02/09/19 10:26 AM   Specimen: BLOOD  Result Value Ref Range Status   Specimen Description BLOOD RIGHT ANTECUBITAL  Final   Special Requests   Final    BOTTLES DRAWN AEROBIC ONLY Blood Culture adequate volume   Culture   Final    NO GROWTH 2 DAYS Performed at [facility], [address], [place], [place] [address]    Report Status PENDING  Incomplete  Urine culture     Status: Abnormal   Collection Time: 02/09/19 10:54 AM   Specimen: Urine, Random  Result Value Ref Range Status   Specimen Description URINE, RANDOM  Final   Special Requests NONE  Final   Culture (A)  Final    <10,000 COLONIES/mL INSIGNIFICANT GROWTH Performed at [facility], [address] [place], [place] [address]    Report Status 02/10/2019 FINAL  Final         Radiology Studies: Ct Angio Chest Pe W And/or Wo Contrast  Result Date: 02/09/2019 CLINICAL DATA:  Headache, sore throat, body aches, and productive cough for the past week. EXAM: CT ANGIOGRAPHY CHEST WITH CONTRAST TECHNIQUE: Multidetector CT imaging of the chest was performed using the standard protocol during bolus administration of intravenous contrast. Multiplanar CT image reconstructions and MIPs were obtained to evaluate the vascular  anatomy. CONTRAST:  75mL OMNIPAQUE IOHEXOL 350 MG/ML SOLN COMPARISON:  Chest x-ray from same day. FINDINGS: Cardiovascular: Satisfactory opacification of the pulmonary arteries to the segmental level. Acute lobar and segmental pulmonary emboli primarily involving the left upper lobe with minimal clot extending into the proximal left interlobar pulmonary artery and left lower lobe superior segmental pulmonary artery. Additional segmental and subsegmental pulmonary emboli in the right upper lobe anterior segment. Overall clot burden is small. Mild cardiomegaly with normal RV/LV ratio. Trace pericardial effusion. Aneurysmal ascending thoracic aorta measuring up to 4.1 cm. Coronary, aortic arch, and branch vessel atherosclerotic vascular disease. Mediastinum/Nodes: No enlarged mediastinal, hilar, or axillary lymph nodes. Mildly patulous esophagus. Thyroid gland and trachea demonstrate no significant findings. Lungs/Pleura: Minimal dependent subsegmental atelectasis in both lower lobes. Mild diffuse central peribronchial thickening. No focal consolidation, pleural effusion, or pneumothorax. No suspicious pulmonary nodule. Upper Abdomen: No acute abnormality. Musculoskeletal: No chest wall abnormality. No acute or significant osseous findings. Review of the MIP images confirms the above findings. IMPRESSION: 1. Acute bilateral pulmonary emboli primarily involving both upper lobes. Overall clot burden is small. No right heart strain. 2. Ascending thoracic aortic aneurysm measuring up to 4.1 cm. Recommend annual imaging followup by CTA or MRA. This recommendation follows 2010 ACCF/AHA/AATS/ACR/ASA/SCA/SCAI/SIR/STS/SVM Guidelines for the Diagnosis and Management of Patients with Thoracic Aortic Disease. Circulation. 2010; 121: Z308-M578. Aortic aneurysm NOS (ICD10-I71.9) 3.  Aortic atherosclerosis (ICD10-I70.0). Critical Value/emergent results were called by telephone at the time of interpretation on 02/09/2019 at 5:06 pm to  [facility], who verbally acknowledged these results. Electronically Signed   By: Obie Dredge M.D.   On: 02/09/2019 17:10   Vas [place] Lower Extremity Venous (dvt)  Result Date: 02/10/2019  Lower Venous Study Indications: Pulmonary embolism.  Anticoagulation: Heparin. Comparison Study: no prior Performing Technologist: Jeb Levering RDMS,  RVT  Examination Guidelines: A complete evaluation includes B-mode imaging, spectral Doppler, color Doppler, and power Doppler as needed of all accessible portions of each vessel. Bilateral testing is considered an integral part of a complete examination. Limited examinations for reoccurring indications may be performed as noted.  +---------+---------------+---------+-----------+----------+--------------+  RIGHT     Compressibility Phasicity Spontaneity Properties Thrombus Aging  +---------+---------------+---------+-----------+----------+--------------+  CFV       Full            Yes       Yes                                    +---------+---------------+---------+-----------+----------+--------------+  SFJ       Full                                                             +---------+---------------+---------+-----------+----------+--------------+  FV Prox   Full                                                             +---------+---------------+---------+-----------+----------+--------------+  FV Mid    Full                                                             +---------+---------------+---------+-----------+----------+--------------+  FV Distal Full                                                             +---------+---------------+---------+-----------+----------+--------------+  PFV       Full                                                             +---------+---------------+---------+-----------+----------+--------------+  POP       Partial         Yes       Yes                    Acute            +---------+---------------+---------+-----------+----------+--------------+  PTV       Full                                                             +---------+---------------+---------+-----------+----------+--------------+  PERO  Full                                                             +---------+---------------+---------+-----------+----------+--------------+   +---------+---------------+---------+-----------+----------+--------------+  LEFT      Compressibility Phasicity Spontaneity Properties Thrombus Aging  +---------+---------------+---------+-----------+----------+--------------+  CFV       Full            Yes       Yes                                    +---------+---------------+---------+-----------+----------+--------------+  SFJ       Full                                                             +---------+---------------+---------+-----------+----------+--------------+  FV Prox   Full                                                             +---------+---------------+---------+-----------+----------+--------------+  FV Mid    Full                                                             +---------+---------------+---------+-----------+----------+--------------+  FV Distal Full                                                             +---------+---------------+---------+-----------+----------+--------------+  PFV       Full                                                             +---------+---------------+---------+-----------+----------+--------------+  POP       Partial         Yes       Yes                    Acute           +---------+---------------+---------+-----------+----------+--------------+  PTV       Full                                                             +---------+---------------+---------+-----------+----------+--------------+  PERO      None                                             Acute            +---------+---------------+---------+-----------+----------+--------------+     Summary: Right: Findings consistent with acute deep vein thrombosis involving the right popliteal vein. Left: Findings consistent with acute deep vein thrombosis involving the left popliteal vein, and left peroneal veins.  *See table(s) above for measurements and observations. Electronically signed by Coral Else MD on 02/10/2019 at 1:10:22 PM.    Final       Scheduled Meds:  amiodarone  200 mg Oral Q12H   Followed by   Melene Muller ON 02/17/2019] amiodarone  200 mg Oral Daily   amLODipine  10 mg Oral Daily   apixaban  10 mg Oral BID   Followed by   Melene Muller ON 02/18/2019] apixaban  5 mg Oral BID   isosorbide mononitrate  60 mg Oral Daily   lisinopril  40 mg Oral Daily   metoprolol tartrate  25 mg Oral BID   tamsulosin  0.4 mg Oral Daily   Continuous Infusions:    LOS: 1 day      Calvert Cantor, MD [facility] Pager: [email or web address] Password [email or web address] 02/11/2019, 1:21 PM

## 2019-02-11 NOTE — Discharge Instructions (Addendum)
Please take you medications as ordered and make sure you have refills before you run out of medications.   You were cared for by a hospitalist during your hospital stay. If you have any questions about your discharge medications or the care you received while you were in the hospital after you are discharged, you can call the unit and asked to speak with the hospitalist on call if the hospitalist that took care of you is not available. Once you are discharged, your primary care physician will handle any further medical issues.   Please note that NO REFILLS for any discharge medications will be authorized once you are discharged, as it is imperative that you return to your primary care physician (or establish a relationship with a primary care physician if you do not have one) for your aftercare needs so that they can reassess your need for medications and monitor your lab values.  Please take all your medications with you for your next visit with your Primary MD. Please ask your Primary MD to get all Hospital records sent to his/her office. Please request your Primary MD to go over all hospital test results at the follow up.   If you experience worsening of your admission symptoms, develop shortness of breath, chest pain, suicidal or homicidal thoughts or a life threatening emergency, you must seek medical attention immediately by calling 911 or calling your MD.   Dennis Bast must read the complete instructions/literature along with all the possible adverse reactions/side effects for all the medicines you take including new medications that have been prescribed to you. Take new medicines after you have completely understood and accpet all the possible adverse reactions/side effects.    Do not drive when taking pain medications or sedatives.     Do not take more than prescribed Pain, Sleep and Anxiety Medications   If you have smoked or chewed Tobacco in the last 2 yrs please stop. Stop any regular alcohol   and or recreational drug use.   Wear Seat belts while driving.    Information on my medicine - ELIQUIS (apixaban)  This medication education was reviewed with me or my healthcare representative as part of my discharge preparation.  The pharmacist that spoke with me during my hospital stay was:  Arty Baumgartner, [facility]  Why was Eliquis prescribed for you? Eliquis was prescribed to treat blood clots that may have been found in the veins of your legs (deep vein thrombosis) or in your lungs (pulmonary embolism) and to reduce the risk of them occurring again.  What do You need to know about Eliquis ? The starting dose is 10 mg (two 5 mg tablets) taken TWICE daily for the FIRST SEVEN (7) DAYS, then on 02/18/19  the dose is reduced to ONE 5 mg tablet taken TWICE daily.  Eliquis may be taken with or without food.   Try to take the dose about the same time in the morning and in the evening. If you have difficulty swallowing the tablet whole please discuss with your pharmacist how to take the medication safely.  Take Eliquis exactly as prescribed and DO NOT stop taking Eliquis without talking to the doctor who prescribed the medication.  Stopping may increase your risk of developing a new blood clot.  Refill your prescription before you run out.  After discharge, you should have regular check-up appointments with your healthcare provider that is prescribing your Eliquis.    What do you do if you miss a dose? If  a dose of ELIQUIS is not taken at the scheduled time, take it as soon as possible on the same day and twice-daily administration should be resumed. The dose should not be doubled to make up for a missed dose.  Important Safety Information A possible side effect of Eliquis is bleeding. You should call your healthcare provider right away if you experience any of the following: ? Bleeding from an injury or your nose that does not stop. ? Unusual colored urine (red or dark brown) or  unusual colored stools (red or black). ? Unusual bruising for unknown reasons. ? A serious fall or if you hit your head (even if there is no bleeding).  Some medicines may interact with Eliquis and might increase your risk of bleeding or clotting while on Eliquis. To help avoid this, consult your healthcare provider or pharmacist prior to using any new prescription or non-prescription medications, including herbals, vitamins, non-steroidal anti-inflammatory drugs (NSAIDs) and supplements.  This website has more information on Eliquis (apixaban): http://www.eliquis.com/eliquis/home

## 2019-02-11 NOTE — Progress Notes (Signed)
SATURATION QUALIFICATIONS: (This note is used to comply with regulatory documentation for home oxygen)  Patient Saturations on Room Air at Rest = 100%  Patient Saturations on Room Air while Ambulating = 92-94%  Patient Saturations on Liters of oxygen while Ambulating = %  Please briefly explain why patient needs home oxygen: Pt does not need oxygen for ambulation.

## 2019-02-11 NOTE — Progress Notes (Signed)
Progress Note  Patient Name: Jesus Morrison Date of Encounter: 02/11/2019  Primary Cardiologist: Armanda Magic, MD  Electrophysiologist: Hillis Range, MD   Subjective   No chest pain or SOB. Has been light-headed or dizzy at times, cannot recall specifics. No hx syncope    Inpatient Medications    Scheduled Meds:  amiodarone  400 mg Oral Q12H   Followed by   Melene Muller ON 02/17/2019] amiodarone  200 mg Oral Daily   amLODipine  10 mg Oral Daily   apixaban  10 mg Oral BID   Followed by   Melene Muller ON 02/18/2019] apixaban  5 mg Oral BID   isosorbide mononitrate  60 mg Oral Daily   lisinopril  40 mg Oral Daily   metoprolol tartrate  25 mg Oral BID   tamsulosin  0.4 mg Oral Daily   Continuous Infusions:  heparin 1,100 Units/hr (02/11/19 0815)   PRN Meds: LORazepam   Vital Signs    Vitals:   02/10/19 2026 02/10/19 2356 02/11/19 0800 02/11/19 0824  BP: (!) 108/56 114/64 (!) 165/64   Pulse: (!) 148 (!) 49 (!) 47 (!) 57  Resp: (!) Temp: 98 F (36.7 C) 97.7 F (36.5 C) 98.2 F (36.8 C)   TempSrc: Oral Oral Oral   SpO2: 100% 99% 100%   Weight:      Height:        Intake/Output Summary (Last 24 hours) at 02/11/2019 0919 Last data filed at 02/11/2019 0400 Gross per 24 hour  Intake 689.39 ml  Output 500 ml  Net 189.39 ml   Filed Weights   02/09/19 0936 02/09/19 2028  Weight: 72.6 kg 74.3 kg    Telemetry    Afib>>SR, mostly sinus brady to 40s at times, w/ last episode of prolonged VT almost 24 hr ago. Frequent PVCs make HR read as 30s at times, worse while asleep - Personally Reviewed  ECG    10/29 ECG is sinus brady, HR 44 w/ PR 242 ms, inferior Q waves are new - Personally Reviewed  Physical Exam   General: Well developed, well nourished, male in no acute distress Head: Eyes PERRLA, Head normocephalic and atraumatic Lungs: clear bilaterally to auscultation. Heart: HRRR S1 S2, without rub or gallop. 2/6 murmur. 4/4 extremity pulses are 1-2+ &  equal. No JVD. Abdomen: Bowel sounds are present, abdomen soft and non-tender without masses or  hernias noted. Msk: Normal strength and tone for age. Extremities: No clubbing, cyanosis or edema.    Skin:  No rashes or lesions noted. Neuro: Alert and oriented X 3. Psych:  Good affect, responds appropriately  Labs    Chemistry Recent Labs  Lab 02/09/19 0950 02/09/19 1040 02/10/19 0235 02/11/19 0316  NA 140 141 138 138  K 3.8 3.6 3.6 4.3  CL 104 104 104 107  CO2 24  --  24 23  GLUCOSE 140* 138* 111* 101*  BUN 25*  CREATININE 1.00 0.80 0.85 1.15  CALCIUM 9.2  --  8.5* 8.3*  PROT 7.1  --   --   --   ALBUMIN 3.7  --   --   --   AST 35  --   --   --   ALT 39  --   --   --   ALKPHOS 53  --   --   --   BILITOT 1.1  --   --   --   GFRNONAA >60  --  >60 56*  GFRAA >60  --  >60 >60  ANIONGAP 12  --  10 8     Hematology Recent Labs  Lab 02/09/19 0950 02/09/19 1040 02/10/19 0235 02/11/19 0316  WBC 8.4  --  8.5 8.0  RBC 4.75  --  4.70 4.47  HGB 13.7 14.6 13.5 13.1  HCT 42.9 43.0 41.4 39.4  MCV 90.3  --  88.1 88.1  MCH 28.8  --  28.7 29.3  MCHC 31.9  --  32.6 33.2  RDW 13.2  --  13.2 13.2  PLT 193  --  193 186    Cardiac Enzymes   BNP Recent Labs  Lab 02/09/19 0956  BNP 400.8*     DDimer  Recent Labs  Lab 02/09/19 0950  DDIMER 2.81*     Radiology    Ct Angio Chest Pe W And/or Wo Contrast  Result Date: 02/09/2019 CLINICAL DATA:  Headache, sore throat, body aches, and productive cough for the past week. EXAM: CT ANGIOGRAPHY CHEST WITH CONTRAST TECHNIQUE: Multidetector CT imaging of the chest was performed using the standard protocol during bolus administration of intravenous contrast. Multiplanar CT image reconstructions and MIPs were obtained to evaluate the vascular anatomy. CONTRAST:  75mL OMNIPAQUE IOHEXOL 350 MG/ML SOLN COMPARISON:  Chest x-ray from same day. FINDINGS: Cardiovascular: Satisfactory opacification of the pulmonary arteries to the  segmental level. Acute lobar and segmental pulmonary emboli primarily involving the left upper lobe with minimal clot extending into the proximal left interlobar pulmonary artery and left lower lobe superior segmental pulmonary artery. Additional segmental and subsegmental pulmonary emboli in the right upper lobe anterior segment. Overall clot burden is small. Mild cardiomegaly with normal RV/LV ratio. Trace pericardial effusion. Aneurysmal ascending thoracic aorta measuring up to 4.1 cm. Coronary, aortic arch, and branch vessel atherosclerotic vascular disease. Mediastinum/Nodes: No enlarged mediastinal, hilar, or axillary lymph nodes. Mildly patulous esophagus. Thyroid gland and trachea demonstrate no significant findings. Lungs/Pleura: Minimal dependent subsegmental atelectasis in both lower lobes. Mild diffuse central peribronchial thickening. No focal consolidation, pleural effusion, or pneumothorax. No suspicious pulmonary nodule. Upper Abdomen: No acute abnormality. Musculoskeletal: No chest wall abnormality. No acute or significant osseous findings. Review of the MIP images confirms the above findings. IMPRESSION: 1. Acute bilateral pulmonary emboli primarily involving both upper lobes. Overall clot burden is small. No right heart strain. 2. Ascending thoracic aortic aneurysm measuring up to 4.1 cm. Recommend annual imaging followup by CTA or MRA. This recommendation follows 2010 ACCF/AHA/AATS/ACR/ASA/SCA/SCAI/SIR/STS/SVM Guidelines for the Diagnosis and Management of Patients with Thoracic Aortic Disease. Circulation. 2010; 121: Z610-R604: E266-e369. Aortic aneurysm NOS (ICD10-I71.9) 3.  Aortic atherosclerosis (ICD10-I70.0). Critical Value/emergent results were called by telephone at the time of interpretation on 02/09/2019 at 5:06 pm to [facility]provider[facility]ATHAN PICKERING, who verbally acknowledged these results. Electronically Signed   By: Obie DredgeWilliam T Derry M.D.   On: 02/09/2019 17:10   Dg Chest Port 1 View  Result Date:  02/09/2019 CLINICAL DATA:  Shortness of breath EXAM: PORTABLE CHEST 1 VIEW COMPARISON:  08/23/2011 FINDINGS: Cardiomegaly. No confluent airspace opacities, effusions or edema. No acute bony abnormality. IMPRESSION: No active disease. Electronically Signed   By: Charlett NoseKevin  Dover M.D.   On: 02/09/2019 12:07   Vas [place] Lower Extremity Venous (dvt)  Result Date: 02/10/2019  Lower Venous Study Indications: Pulmonary embolism.  Anticoagulation: Heparin. Comparison Study: no prior Performing Technologist: Jeb LeveringJill Parker RDMS, RVT  Examination Guidelines: A complete evaluation includes B-mode imaging, spectral Doppler, color Doppler, and power Doppler as needed of all accessible portions of each vessel.  Bilateral testing is considered an integral part of a complete examination. Limited examinations for reoccurring indications may be performed as noted.  +---------+---------------+---------+-----------+----------+--------------+  RIGHT     Compressibility Phasicity Spontaneity Properties Thrombus Aging  +---------+---------------+---------+-----------+----------+--------------+  CFV       Full            Yes       Yes                                    +---------+---------------+---------+-----------+----------+--------------+  SFJ       Full                                                             +---------+---------------+---------+-----------+----------+--------------+  FV Prox   Full                                                             +---------+---------------+---------+-----------+----------+--------------+  FV Mid    Full                                                             +---------+---------------+---------+-----------+----------+--------------+  FV Distal Full                                                             +---------+---------------+---------+-----------+----------+--------------+  PFV       Full                                                              +---------+---------------+---------+-----------+----------+--------------+  POP       Partial         Yes       Yes                    Acute           +---------+---------------+---------+-----------+----------+--------------+  PTV       Full                                                             +---------+---------------+---------+-----------+----------+--------------+  PERO      Full                                                             +---------+---------------+---------+-----------+----------+--------------+   +---------+---------------+---------+-----------+----------+--------------+  LEFT      Compressibility Phasicity Spontaneity Properties Thrombus Aging  +---------+---------------+---------+-----------+----------+--------------+  CFV       Full            Yes       Yes                                    +---------+---------------+---------+-----------+----------+--------------+  SFJ       Full                                                             +---------+---------------+---------+-----------+----------+--------------+  FV Prox   Full                                                             +---------+---------------+---------+-----------+----------+--------------+  FV Mid    Full                                                             +---------+---------------+---------+-----------+----------+--------------+  FV Distal Full                                                             +---------+---------------+---------+-----------+----------+--------------+  PFV       Full                                                             +---------+---------------+---------+-----------+----------+--------------+  POP       Partial         Yes       Yes                    Acute           +---------+---------------+---------+-----------+----------+--------------+  PTV       Full                                                              +---------+---------------+---------+-----------+----------+--------------+  PERO      None                                             Acute           +---------+---------------+---------+-----------+----------+--------------+  Summary: Right: Findings consistent with acute deep vein thrombosis involving the right popliteal vein. Left: Findings consistent with acute deep vein thrombosis involving the left popliteal vein, and left peroneal veins.  *See table(s) above for measurements and observations. Electronically signed by Coral Else MD on 02/10/2019 at 1:10:22 PM.    Final     Cardiac Studies   2D echo 02/09/2019 IMPRESSIONS    1. Left ventricular ejection fraction, by visual estimation, is 60 to 65%. The left ventricle has normal function. Normal left ventricular size. There is moderately increased left ventricular hypertrophy.  2. Global right ventricle has normal systolic function.The right ventricular size is normal. Mildly increased right ventricular wall thickness.  3. Left atrial size was severely dilated.  4. Right atrial size was mildly dilated.  5. Small pericardial effusion.  6. The pericardial effusion is circumferential.  7. The mitral valve is normal in structure. Trace mitral valve regurgitation.  8. The tricuspid valve is normal in structure. Tricuspid valve regurgitation is mild.  9. The aortic valve is normal in structure. Aortic valve regurgitation is mild by color flow Doppler. Mild aortic valve sclerosis without stenosis. 10. The pulmonic valve was not well visualized. Pulmonic valve regurgitation is not visualized by color flow Doppler. 11. There is mild dilatation of the ascending aorta measuring 39 mm. 12. Mildly elevated pulmonary artery systolic pressure. 13. The inferior vena cava is dilated in size with <50% respiratory variability, suggesting right atrial pressure of 15 mmHg.   Patient Profile     85 y.o. male with a hx of HTN and BPH.  Admitted  with SOB and VT/afib.  Found to have bilateral upper lobe PEs  Assessment & Plan    1. Wide-complex tachycardia - per EP, is RVOT VT in setting of acute PE/DVT - nl EF on echo w/ no WMA - IV amio>>po amio at 400 mg bid on 10/28>>200 mg bid - on Lopressor 25 mg bid but doses held due to low HR  - no ICD indicated  2.  New onset atrial fibrillation - spontaneous conversion to SR - on amio>>decrease dose - may not tolerate BB w/ bradycardia, keep for now - CHA2DS2-VASc = 3 (age x 2, HTN) - no invasive cardiac workup planned, now off heparin>>Eliquis  3. Elevated D-dimer - bilat PE on CT - no R heart strain - now on treatment dose Eliquis>> per IM  4.  Hypertension - on lisinopril 40 mg qd, HCTZ 25 mg qd, Norvasc 10 mg qd, Lopressor - no doses missed   For questions or updates, please contact [facility] Please consult [email or web address] for contact info under Cardiology/STEMI.      Signed, Theodore Demark, PA-C  02/11/2019, 9:19 AM

## 2019-02-12 DIAGNOSIS — I480 Paroxysmal atrial fibrillation: Secondary | ICD-10-CM

## 2019-02-12 DIAGNOSIS — I2699 Other pulmonary embolism without acute cor pulmonale: Secondary | ICD-10-CM | POA: Diagnosis not present

## 2019-02-12 DIAGNOSIS — I472 Ventricular tachycardia: Secondary | ICD-10-CM | POA: Diagnosis not present

## 2019-02-12 DIAGNOSIS — I82403 Acute embolism and thrombosis of unspecified deep veins of lower extremity, bilateral: Secondary | ICD-10-CM

## 2019-02-12 DIAGNOSIS — I10 Essential (primary) hypertension: Secondary | ICD-10-CM | POA: Diagnosis not present

## 2019-02-12 DIAGNOSIS — N401 Enlarged prostate with lower urinary tract symptoms: Secondary | ICD-10-CM

## 2019-02-12 DIAGNOSIS — R3911 Hesitancy of micturition: Secondary | ICD-10-CM

## 2019-02-12 DIAGNOSIS — I4891 Unspecified atrial fibrillation: Secondary | ICD-10-CM | POA: Diagnosis not present

## 2019-02-12 DIAGNOSIS — I2609 Other pulmonary embolism with acute cor pulmonale: Secondary | ICD-10-CM

## 2019-02-12 LAB — BASIC METABOLIC PANEL
Anion gap: 8 (ref 5–15)
BUN: 25 mg/dL — ABNORMAL HIGH (ref 8–23)
CO2: 22 mmol/L (ref 22–32)
Calcium: 8.5 mg/dL — ABNORMAL LOW (ref 8.9–10.3)
Chloride: 109 mmol/L (ref 98–111)
Creatinine, Ser: 1.09 mg/dL (ref 0.61–1.24)
GFR calc Af Amer: >60 mL/min (ref >60)
GFR calc non Af Amer: 59 mL/min — ABNORMAL LOW (ref >60)
Glucose, Bld: 109 mg/dL — ABNORMAL HIGH (ref 70–99)
Potassium: 4.3 mmol/L (ref 3.5–5.1)
Sodium: 139 mmol/L (ref 135–145)

## 2019-02-12 MED ORDER — ISOSORBIDE MONONITRATE ER 30 MG PO TB24: 30.0000 mg | ORAL_TABLET | Freq: Every day | ORAL | 0 refills | Status: DC

## 2019-02-12 MED ORDER — APIXABAN 5 MG PO TABS: 5.0000 mg | ORAL_TABLET | Freq: Two times a day (BID) | ORAL | 0 refills | Status: DC

## 2019-02-12 MED ORDER — AMIODARONE HCL 200 MG PO TABS: 200.0000 mg | ORAL_TABLET | Freq: Two times a day (BID) | ORAL | 0 refills | Status: DC

## 2019-02-12 MED ORDER — AMLODIPINE BESYLATE 10 MG PO TABS: 10.0000 mg | ORAL_TABLET | Freq: Every day | ORAL | Status: DC

## 2019-02-12 MED ORDER — AMIODARONE HCL 200 MG PO TABS: 200.0000 mg | ORAL_TABLET | Freq: Every day | ORAL | 0 refills | Status: DC

## 2019-02-12 MED ORDER — APIXABAN 5 MG PO TABS: ORAL_TABLET | ORAL | 0 refills | Status: DC

## 2019-02-12 MED ORDER — METOPROLOL TARTRATE 25 MG PO TABS: 25.0000 mg | ORAL_TABLET | Freq: Two times a day (BID) | ORAL | 0 refills | Status: DC

## 2019-02-12 MED ORDER — LISINOPRIL 40 MG PO TABS
40.0000 mg | ORAL_TABLET | Freq: Every day | ORAL | Status: DC
  Administered 2019-02-12: 40 mg via ORAL
  Filled 2019-02-12: qty 1

## 2019-02-12 MED ORDER — METOPROLOL TARTRATE 12.5 MG HALF TABLET
12.5000 mg | ORAL_TABLET | Freq: Two times a day (BID) | ORAL | Status: DC
  Filled 2019-02-12: qty 1

## 2019-02-12 MED FILL — ELIQUIS STARTER PACK 5 MG T: 5 | 30 days supply | Qty: 74 | Fill #0

## 2019-02-12 MED FILL — METOPROLOL TARTRATE 25 MG T: 25 | 30 days supply | Qty: 60 | Fill #0

## 2019-02-12 MED FILL — AMIODARONE HCL 200 MG TAB: 200 | 30 days supply | Qty: 35 | Fill #0

## 2019-02-12 NOTE — Progress Notes (Signed)
Progress Note  Patient Name: Jesus Morrison Date of Encounter: 02/12/2019  Primary Cardiologist: Armanda Magic, MD   Subjective   Patient says he is feeling much better. Denies chest pain or SOB.   Inpatient Medications    Scheduled Meds: . amiodarone  200 mg Oral Q12H   Followed by  . [START ON 02/17/2019] amiodarone  200 mg Oral Daily  . amLODipine  5 mg Oral Daily  . apixaban  10 mg Oral BID   Followed by  . [START ON 02/18/2019] apixaban  5 mg Oral BID  . isosorbide mononitrate  30 mg Oral Daily  . lisinopril  20 mg Oral Daily  . metoprolol tartrate  25 mg Oral BID  . tamsulosin  0.4 mg Oral Daily   Continuous Infusions:  PRN Meds: LORazepam   Vital Signs    Vitals:   02/11/19 2004 02/12/19 0024 02/12/19 0332 02/12/19 0738  BP: (!) 154/62 138/68 (!) 165/68 (!) 164/66  Pulse: (!) 51 (!) 46 (!) 44   Resp: (!) 22 18 18  (!) 22  Temp: 98.6 F (37 C) 97.9 F (36.6 C) 98 F (36.7 C) 97.7 F (36.5 C)  TempSrc: Oral Oral Oral Oral  SpO2: 100% 97% 99% 100%  Weight:      Height:        Intake/Output Summary (Last 24 hours) at 02/12/2019 0823 Last data filed at 02/12/2019 0700 Gross per 24 hour  Intake 490 ml  Output 850 ml  Net -360 ml   Last 3 Weights 02/09/2019 02/09/2019 11/23/2018  Weight (lbs) 163 lb 14.4 oz 160 lb 169 lb 12.8 oz  Weight (kg) 74.345 kg 72.576 kg 77.021 kg      Telemetry    Sinus bradycardia, Rates in the 50s with some rates down into the 40s; PVCs; first degree AV block - Personally Reviewed  ECG    Sinus bradycardia, 48 bpm; First degree AV block (PRI 01/23/2019), nonspecific St/T wave changes inferolaterally - Personally Reviewed  Physical Exam   GEN: No acute distress.   Neck: No JVD Cardiac: RRR, no murmurs, rubs, or gallops.  Respiratory: Clear to auscultation bilaterally. GI: Soft, nontender, non-distended  MS: No edema; No deformity. Neuro:  Nonfocal  Psych: Normal affect   Labs    High Sensitivity Troponin:   Recent  Labs  Lab 02/09/19 1009 02/09/19 1151  TROPONINIHS 34* 42*      Chemistry Recent Labs  Lab 02/09/19 0950  02/10/19 0235 02/11/19 0316 02/12/19 0221  NA 140   < > 138 138 139  K 3.8   < > 3.6 4.3 4.3  CL 104   < > 104 107 109  CO2 24  --  24 23 22   GLUCOSE 140*   < > 111* 101* 109*  BUN 18   < > 13 25* 25*  CREATININE 1.00   < > 0.85 1.15 1.09  CALCIUM 9.2  --  8.5* 8.3* 8.5*  PROT 7.1  --   --   --   --   ALBUMIN 3.7  --   --   --   --   AST 35  --   --   --   --   ALT 39  --   --   --   --   ALKPHOS 53  --   --   --   --   BILITOT 1.1  --   --   --   --   GFRNONAA >60  --  >  60 56* 59*  GFRAA >60  --  >60 >60 >60  ANIONGAP 12  --  < > = values in this interval not displayed.     Hematology Recent Labs  Lab 02/09/19 0950 02/09/19 1040 02/10/19 0235 02/11/19 0316  WBC 8.4  --  8.5 8.0  RBC 4.75  --  4.70 4.47  HGB 13.7 14.6 13.5 13.1  HCT 42.9 43.0 41.4 39.4  MCV 90.3  --  88.1 88.1  MCH 28.8  --  28.7 29.3  MCHC 31.9  --  32.6 33.2  RDW 13.2  --  13.2 13.2  PLT 193  --  193 186    BNP Recent Labs  Lab 02/09/19 0956  BNP 400.8*     DDimer  Recent Labs  Lab 02/09/19 0950  DDIMER 2.81*     Radiology    Vas [place] Lower Extremity Venous (dvt)  Result Date: 02/10/2019  Lower Venous Study Indications: Pulmonary embolism.  Anticoagulation: Heparin. Comparison Study: no prior Performing Technologist: Jeb Levering RDMS, RVT  Examination Guidelines: A complete evaluation includes B-mode imaging, spectral Doppler, color Doppler, and power Doppler as needed of all accessible portions of each vessel. Bilateral testing is considered an integral part of a complete examination. Limited examinations for reoccurring indications may be performed as noted.  +---------+---------------+---------+-----------+----------+--------------+ RIGHT    CompressibilityPhasicitySpontaneityPropertiesThrombus Aging  +---------+---------------+---------+-----------+----------+--------------+ CFV      Full           Yes      Yes                                 +---------+---------------+---------+-----------+----------+--------------+ SFJ      Full                                                        +---------+---------------+---------+-----------+----------+--------------+ FV Prox  Full                                                        +---------+---------------+---------+-----------+----------+--------------+ FV Mid   Full                                                        +---------+---------------+---------+-----------+----------+--------------+ FV DistalFull                                                        +---------+---------------+---------+-----------+----------+--------------+ PFV      Full                                                        +---------+---------------+---------+-----------+----------+--------------+  POP      Partial        Yes      Yes                  Acute          +---------+---------------+---------+-----------+----------+--------------+ PTV      Full                                                        +---------+---------------+---------+-----------+----------+--------------+ PERO     Full                                                        +---------+---------------+---------+-----------+----------+--------------+   +---------+---------------+---------+-----------+----------+--------------+ LEFT     CompressibilityPhasicitySpontaneityPropertiesThrombus Aging +---------+---------------+---------+-----------+----------+--------------+ CFV      Full           Yes      Yes                                 +---------+---------------+---------+-----------+----------+--------------+ SFJ      Full                                                         +---------+---------------+---------+-----------+----------+--------------+ FV Prox  Full                                                        +---------+---------------+---------+-----------+----------+--------------+ FV Mid   Full                                                        +---------+---------------+---------+-----------+----------+--------------+ FV DistalFull                                                        +---------+---------------+---------+-----------+----------+--------------+ PFV      Full                                                        +---------+---------------+---------+-----------+----------+--------------+ POP      Partial        Yes      Yes                  Acute          +---------+---------------+---------+-----------+----------+--------------+ PTV  Full                                                        +---------+---------------+---------+-----------+----------+--------------+ PERO     None                                         Acute          +---------+---------------+---------+-----------+----------+--------------+     Summary: Right: Findings consistent with acute deep vein thrombosis involving the right popliteal vein. Left: Findings consistent with acute deep vein thrombosis involving the left popliteal vein, and left peroneal veins.  *See table(s) above for measurements and observations. Electronically signed by Harold Barban MD on 02/10/2019 at 1:10:22 PM.    Final     Cardiac Studies   2D echo 02/09/2019 IMPRESSIONS  1. Left ventricular ejection fraction, by visual estimation, is 60 to 65%. The left ventricle has normal function. Normal left ventricular size. There is moderately increased left ventricular hypertrophy. 2. Global right ventricle has normal systolic function.The right ventricular size is normal. Mildly increased right ventricular wall thickness. 3. Left atrial size was  severely dilated. 4. Right atrial size was mildly dilated. 5. Small pericardial effusion. 6. The pericardial effusion is circumferential. 7. The mitral valve is normal in structure. Trace mitral valve regurgitation. 8. The tricuspid valve is normal in structure. Tricuspid valve regurgitation is mild. 9. The aortic valve is normal in structure. Aortic valve regurgitation is mild by color flow Doppler. Mild aortic valve sclerosis without stenosis. 10. The pulmonic valve was not well visualized. Pulmonic valve regurgitation is not visualized by color flow Doppler. 11. There is mild dilatation of the ascending aorta measuring 39 mm. 12. Mildly elevated pulmonary artery systolic pressure. 13. The inferior vena cava is dilated in size with <50% respiratory variability, suggesting right atrial pressure of 15 mmHg.  Patient Profile     85 y.o. male with a hx of HTN and BPH. Admitted with SOB and VT/afib.  Found to have bilateral upper lobe PEs  Assessment & Plan    1. Wide-complex tachycardia - per EP, is RVOT VT in setting of acute PE/DVT - Normal EF on echo w/ no WMA - IV amio>>po amio at 400 mg bid on 10/28>>200 mg bid - No further VT noted - on Lopressor 25 mg bid but doses has been held due to low HR. Rates are in the 50s>> will decrease to 12.5 mg BID. Might have to further decrease dose or discontinue. MD to see  - no ICD indicated  2.New onset atrial fibrillation - spontaneous conversion to SR>> has been maintaining NSR/sinus brady - on amio>>decrease dose as above - may not tolerate BB w/ bradycardia, rates in the 50s>> will decrease Lopressor to 12.5mg  - CHA2DS2-VASc = 3 (age x 2, HTN) - no invasive cardiac workup planned, now off heparin>>Eliquis perIM  3. Elevated D-dimer - Bilateral PE on CT - no Right heart strain - now on treatment dose Eliquis>> per IM  4.Hypertension - on lisinopril 40 mg qd, HCTZ 25 mg qd, Norvasc 10 mg qd, Lopressor - Pressures are  reasonable   For questions or updates, please contact [place] [place] Please consult [email or web address] for contact info under  Signed, Lyah Millirons David StallH Tamir Wallman, PA-C  02/12/2019, 8:23 AM

## 2019-02-12 NOTE — Progress Notes (Signed)
Patient in a stable condition, discharge education reviewed with patient and his daughter at bedside, they verbalized understanding, iv removed, tele dc ccmd notified, patient belongings at bedside, patient to be transported home by his daughter.

## 2019-02-12 NOTE — TOC Transition Note (Signed)
Transition of Care [facility]) - CM/SW Discharge Note Marvetta Gibbons RN, BSN Transitions of Care Unit 4E- RN Case Manager [phone]   Patient Details  Name: Jesus Morrison MRN: [id number] Date of Birth: November 22, 1926  Transition of Care [facility]) CM/SW Contact:  Dawayne Patricia, RN Phone Number: 02/12/2019, 11:36 AM   Clinical Narrative:    Pt stable for transition home today, f/u appointment made with PCP at [facility] for Nov. 25 at 2:30 - placed on AVS. Pt also new to Eliquis- benefits check done - copay cost $35/mo. TOC pharmacy to fill medications prior to discharge and deliver to bedside. PTA pt lived at home and was independent.   Final next level of care: Home/Self Care Barriers to Discharge: No Barriers Identified   Patient Goals and CMS Choice     Choice offered to / list presented to : NA  Discharge Placement               Home / self care        Discharge Plan and Services   Discharge Planning Services: Follow-up appt scheduled Post Acute Care Choice: NA          DME Arranged: N/A DME Agency: NA       HH Arranged: NA HH Agency: NA        Social Determinants of Health (SDOH) Interventions     Readmission Risk Interventions Readmission Risk Prevention Plan 02/12/2019  Post Dischage Appt Complete  Medication Screening Complete  Transportation Screening Complete  Some recent data might be hidden

## 2019-02-12 NOTE — Plan of Care (Signed)
  Problem: Education: Goal: Knowledge of General Education information will improve Description Including pain rating scale, medication(s)/side effects and non-pharmacologic comfort measures Outcome: Completed/Met   Problem: Health Behavior/Discharge Planning: Goal: Ability to manage health-related needs will improve Outcome: Completed/Met   Problem: Clinical Measurements: Goal: Ability to maintain clinical measurements within normal limits will improve Outcome: Completed/Met Goal: Will remain free from infection Outcome: Completed/Met Goal: Diagnostic test results will improve Outcome: Completed/Met Goal: Respiratory complications will improve Outcome: Completed/Met Goal: Cardiovascular complication will be avoided Outcome: Completed/Met   Problem: Coping: Goal: Level of anxiety will decrease Outcome: Completed/Met   Problem: Elimination: Goal: Will not experience complications related to bowel motility Outcome: Completed/Met Goal: Will not experience complications related to urinary retention Outcome: Completed/Met   Problem: Pain Managment: Goal: General experience of comfort will improve Outcome: Completed/Met   Problem: Safety: Goal: Ability to remain free from injury will improve Outcome: Completed/Met   Problem: Skin Integrity: Goal: Risk for impaired skin integrity will decrease Outcome: Completed/Met   

## 2019-02-12 NOTE — Discharge Summary (Signed)
Physician Discharge Summary  Jesus Morrison WUJ:[id number] DOB: May 18, 1926 DOA: 02/09/2019  PCP: Hoy Register, MD  Admit date: 02/09/2019 Discharge date: 02/12/2019  Admitted From: home Disposition:  home   Recommendations for Outpatient Follow-up:  1. F/u on BP, HR and BUN/Cr    Discharge Condition:  stable   CODE STATUS:  Full code   Diet recommendation:  Heart healthy Consultations:  cardiology    Discharge Diagnoses:  Principal Problem:   Bilateral pulmonary emboli (HCC) Active Problems:   Leg DVT (deep venous thromboembolism), acute, bilateral   V tach (HCC)   New onset atrial fibrillation (HCC)   Hypertension   BPH (benign prostatic hyperplasia)     Brief Summary: Emet Rafanan is a 85 year old male with BPH, hypertension who woke up in the middle of the night suddenly short of breath.  He also noted sore throat and a cough.  In the ED he was noted to have A. fib with RVR and then developed V. Tach. He was started on an amiodarone infusion.  Urology was consulted.  A CTA was done which revealed pulmonary emboli.  He was started on IV heparin. COVID-19 was negative.  Hospital Course:  Principal Problem:   Bilateral pulmonary embolism- b/l DVT - Acute bilateral pulmonary emboli primarily involving both upper lobes. Overall clot burden is small - vascular ultrasound LE> deep vein thrombosis involving the right popliteal vein, left popliteal vein, and left peroneal veins. - I suspect he is mostly sedentary and that may have caused his DVTs  - started on a Heparin infusion and transitioned to Eliquis yesterday. He is not a fall risk and has no other contraindications for Eliquis - he has ambulated in the hall and is not hypoxic when ambulating - I have asked case management to obtain appt with [facility] (his PCP) for 2-3 wks from now  Active Problems:     A-fib /   V tach with mildly elevated troponin as a result -  TSH is normal - ECHO shows LVH- see report below -  Cardiology consult requested- they consulted EP - has had many Laverne runs of V tach as well 10/28, these have improved- yesterday he had one 3 beat run.  - on Amiodarone and Lopressor per cardiology - IV Amio changed to oral on 10 /28- received another loading dose orally on 10/28 as he had further V tach - he converted to sinus brady later on 10/28 -  Amio has been weaned down to 200 BID- Dose to be weaned to 200 daily on 10/5 per cardiology - Lopressor no longer being given as HR has been in 40-50s after converting - Dr Brennan Bailey will arrange f/u with EP     Mod LVH and h/o HTN -  d/c Imdur per cardiology.  - I will d/c HCTZ to prevent dehydration & hypotension in setting of PE- BUN Cr ratio is slightly elevated already - cont Norvasc 10 mg daily and Lisinopril 10 mg daily    BPH -cont Flomax   I have explained the plan to the patient and the necessity to take the medications appropriately. He appears to understand. I have answered all questions.    Discharge Exam: Vitals:   02/12/19 0332 02/12/19 0738  BP: (!) 165/68 (!) 164/66  Pulse: (!) 44   Resp: 18 (!) 22  Temp: 98 F (36.7 C) 97.7 F (36.5 C)  SpO2: 99% 100%   Vitals:   02/11/19 2004 02/12/19 0024 02/12/19 0332 02/12/19 0738  BP: (!) 154/62  138/68 (!) 165/68 (!) 164/66  Pulse: (!) 51 (!) 46 (!) 44   Resp: (!) 22 18 18  (!) 22  Temp: 98.6 F (37 C) 97.9 F (36.6 C) 98 F (36.7 C) 97.7 F (36.5 C)  TempSrc: Oral Oral Oral Oral  SpO2: 100% 97% 99% 100%  Weight:      Height:        General: Pt is alert, awake, not in acute distress Cardiovascular: RRR, S1/S2 +, no rubs, no gallops Respiratory: CTA bilaterally, no wheezing, no rhonchi Abdominal: Soft, NT, ND, bowel sounds + Extremities: no edema, no cyanosis   Discharge Instructions  Discharge Instructions    Diet - low sodium heart healthy   Complete by: As directed    Increase activity slowly   Complete by: As directed      Allergies as of  02/12/2019   No Known Allergies     Medication List    STOP taking these medications   isosorbide mononitrate 60 MG 24 hr tablet Commonly known as: Imdur   lactulose 10 GM/15ML solution Commonly known as: CHRONULAC   lisinopril-hydrochlorothiazide 20-12.5 MG tablet Commonly known as: Zestoretic     TAKE these medications   amiodarone 200 MG tablet Commonly known as: PACERONE Take 1 tablet (200 mg total) by mouth every 12 (twelve) hours for 5 days.   amiodarone 200 MG tablet Commonly known as: PACERONE Take 1 tablet (200 mg total) by mouth daily. Start taking on: February 17, 2019   amLODipine 10 MG tablet Commonly known as: NORVASC Take 1 tablet (10 mg total) by mouth daily.   apixaban 5 MG Tabs tablet Commonly known as: ELIQUIS Take 2 tabs twice a day for 5.5 days.   apixaban 5 MG Tabs tablet Commonly known as: ELIQUIS Take 1 tablet (5 mg total) by mouth 2 (two) times daily. Start taking on: February 18, 2019   cetirizine 10 MG tablet Commonly known as: ZYRTEC Take 1 tablet (10 mg total) by mouth daily.   hydrocortisone cream 0.5 % Apply 1 application topically 2 (two) times daily.   metoprolol tartrate 25 MG tablet Commonly known as: LOPRESSOR Take 1 tablet (25 mg total) by mouth 2 (two) times daily.   Misc. Devices Misc Blood Pressure monitor Dx: Hypertension   polyethylene glycol 17 g packet Commonly known as: MIRALAX / GLYCOLAX Take 17 g by mouth daily.   tamsulosin 0.4 MG Caps capsule Commonly known as: FLOMAX Take 1 capsule (0.4 mg total) by mouth daily.   triamcinolone cream 0.1 % Commonly known as: KENALOG Apply 1 application topically 2 (two) times daily.       No Known Allergies   Procedures/Studies:    Ct Angio Chest Pe W And/or Wo Contrast  Result Date: 02/09/2019 CLINICAL DATA:  Headache, sore throat, body aches, and productive cough for the past week. EXAM: CT ANGIOGRAPHY CHEST WITH CONTRAST TECHNIQUE: Multidetector CT  imaging of the chest was performed using the standard protocol during bolus administration of intravenous contrast. Multiplanar CT image reconstructions and MIPs were obtained to evaluate the vascular anatomy. CONTRAST:  75mL OMNIPAQUE IOHEXOL 350 MG/ML SOLN COMPARISON:  Chest x-ray from same day. FINDINGS: Cardiovascular: Satisfactory opacification of the pulmonary arteries to the segmental level. Acute lobar and segmental pulmonary emboli primarily involving the left upper lobe with minimal clot extending into the proximal left interlobar pulmonary artery and left lower lobe superior segmental pulmonary artery. Additional segmental and subsegmental pulmonary emboli in the right upper lobe anterior segment. Overall clot  burden is small. Mild cardiomegaly with normal RV/LV ratio. Trace pericardial effusion. Aneurysmal ascending thoracic aorta measuring up to 4.1 cm. Coronary, aortic arch, and branch vessel atherosclerotic vascular disease. Mediastinum/Nodes: No enlarged mediastinal, hilar, or axillary lymph nodes. Mildly patulous esophagus. Thyroid gland and trachea demonstrate no significant findings. Lungs/Pleura: Minimal dependent subsegmental atelectasis in both lower lobes. Mild diffuse central peribronchial thickening. No focal consolidation, pleural effusion, or pneumothorax. No suspicious pulmonary nodule. Upper Abdomen: No acute abnormality. Musculoskeletal: No chest wall abnormality. No acute or significant osseous findings. Review of the MIP images confirms the above findings. IMPRESSION: 1. Acute bilateral pulmonary emboli primarily involving both upper lobes. Overall clot burden is small. No right heart strain. 2. Ascending thoracic aortic aneurysm measuring up to 4.1 cm. Recommend annual imaging followup by CTA or MRA. This recommendation follows 2010 ACCF/AHA/AATS/ACR/ASA/SCA/SCAI/SIR/STS/SVM Guidelines for the Diagnosis and Management of Patients with Thoracic Aortic Disease. Circulation. 2010; 121:  D664-Q034. Aortic aneurysm NOS (ICD10-I71.9) 3.  Aortic atherosclerosis (ICD10-I70.0). Critical Value/emergent results were called by telephone at the time of interpretation on 02/09/2019 at 5:06 pm to [facility], who verbally acknowledged these results. Electronically Signed   By: Titus Dubin M.D.   On: 02/09/2019 17:10   Dg Chest Port 1 View  Result Date: 02/09/2019 CLINICAL DATA:  Shortness of breath EXAM: PORTABLE CHEST 1 VIEW COMPARISON:  08/23/2011 FINDINGS: Cardiomegaly. No confluent airspace opacities, effusions or edema. No acute bony abnormality. IMPRESSION: No active disease. Electronically Signed   By: Rolm Baptise M.D.   On: 02/09/2019 12:07   Vas [place] Lower Extremity Venous (dvt)  Result Date: 02/10/2019  Lower Venous Study Indications: Pulmonary embolism.  Anticoagulation: Heparin. Comparison Study: no prior Performing Technologist: June Leap RDMS, RVT  Examination Guidelines: A complete evaluation includes B-mode imaging, spectral Doppler, color Doppler, and power Doppler as needed of all accessible portions of each vessel. Bilateral testing is considered an integral part of a complete examination. Limited examinations for reoccurring indications may be performed as noted.  +---------+---------------+---------+-----------+----------+--------------+ RIGHT    CompressibilityPhasicitySpontaneityPropertiesThrombus Aging +---------+---------------+---------+-----------+----------+--------------+ CFV      Full           Yes      Yes                                 +---------+---------------+---------+-----------+----------+--------------+ SFJ      Full                                                        +---------+---------------+---------+-----------+----------+--------------+ FV Prox  Full                                                        +---------+---------------+---------+-----------+----------+--------------+ FV Mid   Full                                                         +---------+---------------+---------+-----------+----------+--------------+ FV DistalFull                                                        +---------+---------------+---------+-----------+----------+--------------+  PFV      Full                                                        +---------+---------------+---------+-----------+----------+--------------+ POP      Partial        Yes      Yes                  Acute          +---------+---------------+---------+-----------+----------+--------------+ PTV      Full                                                        +---------+---------------+---------+-----------+----------+--------------+ PERO     Full                                                        +---------+---------------+---------+-----------+----------+--------------+   +---------+---------------+---------+-----------+----------+--------------+ LEFT     CompressibilityPhasicitySpontaneityPropertiesThrombus Aging +---------+---------------+---------+-----------+----------+--------------+ CFV      Full           Yes      Yes                                 +---------+---------------+---------+-----------+----------+--------------+ SFJ      Full                                                        +---------+---------------+---------+-----------+----------+--------------+ FV Prox  Full                                                        +---------+---------------+---------+-----------+----------+--------------+ FV Mid   Full                                                        +---------+---------------+---------+-----------+----------+--------------+ FV DistalFull                                                        +---------+---------------+---------+-----------+----------+--------------+ PFV      Full                                                         +---------+---------------+---------+-----------+----------+--------------+  POP      Partial        Yes      Yes                  Acute          +---------+---------------+---------+-----------+----------+--------------+ PTV      Full                                                        +---------+---------------+---------+-----------+----------+--------------+ PERO     None                                         Acute          +---------+---------------+---------+-----------+----------+--------------+     Summary: Right: Findings consistent with acute deep vein thrombosis involving the right popliteal vein. Left: Findings consistent with acute deep vein thrombosis involving the left popliteal vein, and left peroneal veins.  *See table(s) above for measurements and observations. Electronically signed by Coral ElseVance Brabham MD on 02/10/2019 at 1:10:22 PM.    Final      The results of significant diagnostics from this hospitalization (including imaging, microbiology, ancillary and laboratory) are listed below for reference.     Microbiology: Recent Results (from the past 240 hour(s))  SARS Coronavirus 2 by RT PCR (hospital order, [facility]) Nasopharyngeal Nasopharyngeal Swab     Status: None   Collection Time: 02/09/19 10:14 AM   Specimen: Nasopharyngeal Swab  Result Value Ref Range Status   SARS Coronavirus 2 NEGATIVE NEGATIVE Final    Comment: (NOTE) If result is NEGATIVE SARS-CoV-2 target nucleic acids are NOT DETECTED. The SARS-CoV-2 RNA is generally detectable in upper and lower  respiratory specimens during the acute phase of infection. The lowest  concentration of SARS-CoV-2 viral copies this assay can detect is 250  copies / mL. A negative result does not preclude SARS-CoV-2 infection  and should not be used as the sole basis for treatment or other  patient management decisions.  A negative result may occur with  improper specimen collection /  handling, submission of specimen other  than nasopharyngeal swab, presence of viral mutation(s) within the  areas targeted by this assay, and inadequate number of viral copies  (<250 copies / mL). A negative result must be combined with clinical  observations, patient history, and epidemiological information. If result is POSITIVE SARS-CoV-2 target nucleic acids are DETECTED. The SARS-CoV-2 RNA is generally detectable in upper and lower  respiratory specimens dur ing the acute phase of infection.  Positive  results are indicative of active infection with SARS-CoV-2.  Clinical  correlation with patient history and other diagnostic information is  necessary to determine patient infection status.  Positive results do  not rule out bacterial infection or co-infection with other viruses. If result is PRESUMPTIVE POSTIVE SARS-CoV-2 nucleic acids MAY BE PRESENT.   A presumptive positive result was obtained on the submitted specimen  and confirmed on repeat testing.  While 2019 novel coronavirus  (SARS-CoV-2) nucleic acids may be present in the submitted sample  additional confirmatory testing may be necessary for epidemiological  and / or clinical management purposes  to differentiate between  SARS-CoV-2 and other Sarbecovirus currently known to  infect humans.  If clinically indicated additional testing with an alternate test  methodology [phone]) is advised. The SARS-CoV-2 RNA is generally  detectable in upper and lower respiratory sp ecimens during the acute  phase of infection. The expected result is Negative. Fact Sheet for Patients:  BoilerBrush.com.cy Fact Sheet for Healthcare Providers: https://pope.com/ This test is not yet approved or cleared by the [place] FDA and has been authorized for detection and/or diagnosis of SARS-CoV-2 by FDA under an Emergency Use Authorization (EUA).  This EUA will remain in effect (meaning this  test can be used) for the duration of the COVID-19 declaration under Section 564(b)(1) of the Act, 21 U.S.C. section 360bbb-3(b)(1), unless the authorization is terminated or revoked sooner. Performed at [facility], [address] [place], [place] [address]   Blood Culture (routine x 2)     Status: None (Preliminary result)   Collection Time: 02/09/19 10:15 AM   Specimen: BLOOD  Result Value Ref Range Status   Specimen Description BLOOD LEFT ANTECUBITAL  Final   Special Requests   Final    BOTTLES DRAWN AEROBIC AND ANAEROBIC Blood Culture results may not be optimal due to an inadequate volume of blood received in culture bottles   Culture   Final    NO GROWTH 3 DAYS Performed at [facility], [address] [place], [place] [address]    Report Status PENDING  Incomplete  Blood Culture (routine x 2)     Status: None (Preliminary result)   Collection Time: 02/09/19 10:26 AM   Specimen: BLOOD  Result Value Ref Range Status   Specimen Description BLOOD RIGHT ANTECUBITAL  Final   Special Requests   Final    BOTTLES DRAWN AEROBIC ONLY Blood Culture adequate volume   Culture   Final    NO GROWTH 3 DAYS Performed at [facility], [address] [place], [place] [address]    Report Status PENDING  Incomplete  Urine culture     Status: Abnormal   Collection Time: 02/09/19 10:54 AM   Specimen: Urine, Random  Result Value Ref Range Status   Specimen Description URINE, RANDOM  Final   Special Requests NONE  Final   Culture (A)  Final    <10,000 COLONIES/mL INSIGNIFICANT GROWTH Performed at [facility], [address], [place], [place] [address]    Report Status 02/10/2019 FINAL  Final     Labs: BNP (last 3 results) Recent Labs    02/09/19 0956  BNP 400.8*   Basic Metabolic Panel: Recent Labs  Lab 02/09/19 0950 02/09/19 1009 02/09/19 1040 02/10/19 0235 02/11/19 0316 02/12/19 0221  NA 140  --  141 138 138 139  K 3.8  --  3.6 3.6 4.3 4.3  CL  104  --  104 104 107 109  CO2 24  --   --  GLUCOSE 140*  --  138* 111* 101* 109*  BUN 18  --  21 13 25* 25*  CREATININE 1.00  --  0.80 0.85 1.15 1.09  CALCIUM 9.2  --   --  8.5* 8.3* 8.5*  MG  --  2.0  --   --   --   --    Liver Function Tests: Recent Labs  Lab 02/09/19 0950  AST 35  ALT 39  ALKPHOS 53  BILITOT 1.1  PROT 7.1  ALBUMIN 3.7   No results for input(s): LIPASE, AMYLASE in the last 168 hours.  No results for input(s): AMMONIA in the last 168 hours. CBC: Recent Labs  Lab 02/09/19 0950 02/09/19 1040 02/10/19 0235 02/11/19 0316  WBC 8.4  --  8.5 8.0  NEUTROABS 6.7  --   --   --   HGB 13.7 14.6 13.5 13.1  HCT 42.9 43.0 41.4 39.4  MCV 90.3  --  88.1 88.1  PLT 193  --  193 186   Cardiac Enzymes: No results for input(s): CKTOTAL, CKMB, CKMBINDEX, TROPONINI in the last 168 hours. BNP: Invalid input(s): POCBNP CBG: No results for input(s): GLUCAP in the last 168 hours. D-Dimer Recent Labs    02/09/19 0950  DDIMER 2.81*   Hgb A1c No results for input(s): HGBA1C in the last 72 hours. Lipid Profile Recent Labs    02/09/19 1009  TRIG 39   Thyroid function studies Recent Labs    02/10/19 1440  TSH 1.421   Anemia work up Recent Labs    02/09/19 0950  FERRITIN 76   Urinalysis    Component Value Date/Time   COLORURINE YELLOW 02/09/2019 1025   APPEARANCEUR CLEAR 02/09/2019 1025   LABSPEC 1.018 02/09/2019 1025   PHURINE 5.0 02/09/2019 1025   GLUCOSEU NEGATIVE 02/09/2019 1025   HGBUR SMALL (A) 02/09/2019 1025   BILIRUBINUR NEGATIVE 02/09/2019 1025   BILIRUBINUR negative 11/07/2017 1139   KETONESUR NEGATIVE 02/09/2019 1025   PROTEINUR 30 (A) 02/09/2019 1025   UROBILINOGEN 1.0 11/07/2017 1139   UROBILINOGEN 0.2 04/14/2012 0900   NITRITE NEGATIVE 02/09/2019 1025   LEUKOCYTESUR NEGATIVE 02/09/2019 1025   Sepsis Labs Invalid input(s): PROCALCITONIN,  WBC,  LACTICIDVEN Microbiology Recent Results (from the past 240 hour(s))  SARS  Coronavirus 2 by RT PCR (hospital order, performed in [facility] hospital lab) Nasopharyngeal Nasopharyngeal Swab     Status: None   Collection Time: 02/09/19 10:14 AM   Specimen: Nasopharyngeal Swab  Result Value Ref Range Status   SARS Coronavirus 2 NEGATIVE NEGATIVE Final    Comment: (NOTE) If result is NEGATIVE SARS-CoV-2 target nucleic acids are NOT DETECTED. The SARS-CoV-2 RNA is generally detectable in upper and lower  respiratory specimens during the acute phase of infection. The lowest  concentration of SARS-CoV-2 viral copies this assay can detect is 250  copies / mL. A negative result does not preclude SARS-CoV-2 infection  and should not be used as the sole basis for treatment or other  patient management decisions.  A negative result may occur with  improper specimen collection / handling, submission of specimen other  than nasopharyngeal swab, presence of viral mutation(s) within the  areas targeted by this assay, and inadequate number of viral copies  (<250 copies / mL). A negative result must be combined with clinical  observations, patient history, and epidemiological information. If result is POSITIVE SARS-CoV-2 target nucleic acids are DETECTED. The SARS-CoV-2 RNA is generally detectable in upper and lower  respiratory specimens dur ing the acute phase of infection.  Positive  results are indicative of active infection with SARS-CoV-2.  Clinical  correlation with patient history and other diagnostic information is  necessary to determine patient infection status.  Positive results do  not rule out bacterial infection or co-infection with other viruses. If result is PRESUMPTIVE POSTIVE SARS-CoV-2 nucleic acids MAY BE PRESENT.   A presumptive positive result was obtained on the submitted specimen  and confirmed on repeat testing.  While 2019 novel coronavirus  (SARS-CoV-2) nucleic acids may be present in the submitted sample  additional confirmatory testing may be  necessary for epidemiological  and / or clinical management purposes  to differentiate between  SARS-CoV-2 and other Sarbecovirus currently known to infect humans.  If clinically indicated additional testing with an alternate test  methodology [phone]) is advised. The SARS-CoV-2 RNA is generally  detectable in upper and lower respiratory sp ecimens during the acute  phase of infection. The expected result is Negative. Fact Sheet for Patients:  BoilerBrush.com.cy Fact Sheet for Healthcare Providers: https://pope.com/ This test is not yet approved or cleared by the [place] FDA and has been authorized for detection and/or diagnosis of SARS-CoV-2 by FDA under an Emergency Use Authorization (EUA).  This EUA will remain in effect (meaning this test can be used) for the duration of the COVID-19 declaration under Section 564(b)(1) of the Act, 21 U.S.C. section 360bbb-3(b)(1), unless the authorization is terminated or revoked sooner. Performed at [facility], [address] [place], [place] [address]   Blood Culture (routine x 2)     Status: None (Preliminary result)   Collection Time: 02/09/19 10:15 AM   Specimen: BLOOD  Result Value Ref Range Status   Specimen Description BLOOD LEFT ANTECUBITAL  Final   Special Requests   Final    BOTTLES DRAWN AEROBIC AND ANAEROBIC Blood Culture results may not be optimal due to an inadequate volume of blood received in culture bottles   Culture   Final    NO GROWTH 3 DAYS Performed at [facility], [address] [place], [place] [address]    Report Status PENDING  Incomplete  Blood Culture (routine x 2)     Status: None (Preliminary result)   Collection Time: 02/09/19 10:26 AM   Specimen: BLOOD  Result Value Ref Range Status   Specimen Description BLOOD RIGHT ANTECUBITAL  Final   Special Requests   Final    BOTTLES DRAWN AEROBIC ONLY Blood Culture adequate volume   Culture    Final    NO GROWTH 3 DAYS Performed at [facility], [address], [place], [place] [address]    Report Status PENDING  Incomplete  Urine culture     Status: Abnormal   Collection Time: 02/09/19 10:54 AM   Specimen: Urine, Random  Result Value Ref Range Status   Specimen Description URINE, RANDOM  Final   Special Requests NONE  Final   Culture (A)  Final    <10,000 COLONIES/mL INSIGNIFICANT GROWTH Performed at [facility], [address] [place], [place] [address]    Report Status 02/10/2019 FINAL  Final     Time coordinating discharge in minutes: 65  SIGNED:   Calvert Cantor, MD  [facility] 02/12/2019, 9:40 AM Pager   If 7PM-7AM, please contact night-coverage [email or web address] Password [email or web address]

## 2019-02-12 NOTE — Care Management (Signed)
Per Latricia Heft. with [facility]. Co-pay amount for Eliquis 5mg .bid for 30 day supply at retail pharmacy $35.00. Mail order thru [facility].  co- pay for a 90 day supply bid: $70.00.  No PA required No Deductible Tier 2 medication Retail Pharmacies are: [facility], [facility] .

## 2019-02-14 LAB — CULTURE, BLOOD (ROUTINE X 2)
Culture: NO GROWTH
Culture: NO GROWTH
Special Requests: ADEQUATE

## 2019-02-22 ENCOUNTER — Encounter (INDEPENDENT_AMBULATORY_CARE_PROVIDER_SITE_OTHER): Payer: Self-pay

## 2019-02-22 ENCOUNTER — Telehealth: Payer: Self-pay | Admitting: Family Medicine

## 2019-02-22 ENCOUNTER — Other Ambulatory Visit: Payer: Self-pay

## 2019-02-22 ENCOUNTER — Ambulatory Visit (INDEPENDENT_AMBULATORY_CARE_PROVIDER_SITE_OTHER): Payer: 59 | Admitting: Student

## 2019-02-22 VITALS — BP 172/60 | HR 41 | Ht 65.0 in | Wt 167.0 lb

## 2019-02-22 DIAGNOSIS — I10 Essential (primary) hypertension: Secondary | ICD-10-CM | POA: Diagnosis not present

## 2019-02-22 MED ORDER — LISINOPRIL 20 MG PO TABS: 20.0000 mg | ORAL_TABLET | Freq: Every day | ORAL | 3 refills | Status: DC

## 2019-02-22 NOTE — Patient Instructions (Addendum)
Medication Instructions:    STOP TAKING:  METOPROLOL LOPRSSOR  START TAKIING :  LISINOPRIL 20 MG ONCE A DAY     *If you need a refill on your cardiac medications before your next appointment, please call your pharmacy*  Lab Work:   [place] BMET     If you have labs (blood work) drawn today and your tests are completely normal, you will receive your results only by: Marland Kitchen MyChart Message (if you have MyChart) OR . A paper copy in the mail If you have any lab test that is abnormal or we need to change your treatment, we will call you to review the results.  Testing/Procedures: NONE ORDERED  TODAY    Follow-Up: At [facility], you and your health needs are our priority.  As part of our continuing mission to provide you with exceptional heart care, we have created designated Provider Care Teams.  These Care Teams include your primary Cardiologist (physician) and Advanced Practice Providers (APPs -  Physician Assistants and Nurse Practitioners) who all work together to provide you with the care you need, when you need it.  Your next appointment:   ONE WEEK NURSE VISIT  LABS EKG AND BLOOD PRESSURE   ONE MONTH  WITH  TILLERY ON DAY EP DR IN OFFICE    Other Instructions

## 2019-02-22 NOTE — Progress Notes (Signed)
PCP:  Charlott Rakes, MD Primary Cardiologist: Fransico Him, MD Electrophysiologist: Thompson Grayer, MD   Jesus Morrison is a 85 y.o. 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 No indication for EPS in that setting, and given his advanced age, a conservative approach was advised. They are seen for Dr. Rayann Heman.   Since leaving the hospital,  the patient reports doing well overall. He has had no syncope, SOB, CP, nausea, vomiting, or early satiety. He has had some dizziness, that at times feels like the room is spinning. He specifically denies syncope, near-syncope, or lightheadedness with standing. No falls.   The patient feels that he is tolerating medications without difficulties and is otherwise without complaint today.   Past Medical History:  Diagnosis Date  . Arthritis    fingers and hands  . BPH (benign prostatic hypertrophy)    problems with acute urinary retention- has indwelling foley catheter  . Cold (disease)    last week ( week of Dec 22- Dec 29 ) - no longer coughing, no fever and feels better now  . Enlarged prostate   . Hypertension    Past Surgical History:  Procedure Laterality Date  .  Right elbow surgery as a child    . PROSTATECTOMY  04/17/2012   Procedure: PROSTATECTOMY RETROPUBIC;  Surgeon: Hanley Ben, MD;  Location: [facility];  Service: Urology;  Laterality: N/A;  Simple Retropubic Prostatectomy      Current Outpatient Medications  Medication Sig Dispense Refill  . amiodarone (PACERONE) 200 MG tablet Take 1 tablet (200 mg total) by mouth daily. 30 tablet 0  . amLODipine (NORVASC) 10 MG tablet Take 1 tablet (10 mg total) by mouth daily. 90 tablet 1  . apixaban (ELIQUIS) 5 MG TABS tablet Take 1 tablet (5 mg total) by mouth 2 (two) times daily. 120 tablet 0  . cetirizine (ZYRTEC) 10 MG tablet Take 1 tablet  (10 mg total) by mouth daily. 30 tablet 1  . hydrocortisone cream 0.5 % Apply 1 application topically 2 (two) times daily. 30 g 1  . metoprolol tartrate (LOPRESSOR) 25 MG tablet Take 1 tablet (25 mg total) by mouth 2 (two) times daily. 60 tablet 0  . Misc. Devices MISC Blood Pressure monitor Dx: Hypertension 1 each 0  . polyethylene glycol (MIRALAX / GLYCOLAX) packet Take 17 g by mouth daily. 14 each 0  . tamsulosin (FLOMAX) 0.4 MG CAPS capsule Take 1 capsule (0.4 mg total) by mouth daily. 90 capsule 1  . triamcinolone cream (KENALOG) 0.1 % Apply 1 application topically 2 (two) times daily. 30 g 1   No current facility-administered medications for this visit.     No Known Allergies  Social History   Socioeconomic History  . Marital status: Single    Spouse name: Not on file  . Number of children: Not on file  . Years of education: Not on file  . Highest education level: Not on file  Occupational History  . Not on file  Social Needs  . Financial resource strain: Not on file  . Food insecurity    Worry: Not on file    Inability: Not on file  . Transportation needs    Medical: Not on file    Non-medical: Not on file  Tobacco Use  . Smoking status: Never Smoker  . Smokeless tobacco: Never Used  Substance and Sexual Activity  . Alcohol use: Yes    Alcohol/week: 4.0 - 6.0 standard drinks    Types: 3 - 4 Cans of beer, 1 - 2 Shots of liquor per week    Comment: "weekends"  . Drug use: No  . Sexual activity: Not on file  Lifestyle  . Physical activity    Days per week: Not on file    Minutes per session: Not on file  . Stress: Not on file  Relationships  . Social Musician on phone: Not on file    Gets together: Not on file    Attends religious service: Not on file    Active member of club or organization: Not on file    Attends meetings of clubs or organizations: Not on file    Relationship status: Not on file  . Intimate partner violence    Fear of current  or ex partner: Not on file    Emotionally abused: Not on file    Physically abused: Not on file    Forced sexual activity: Not on file  Other Topics Concern  . Not on file  Social History Narrative  . Not on file     Review of Systems: General: No chills, fever, night sweats or weight changes  Cardiovascular:  No chest pain, dyspnea on exertion, edema, orthopnea, palpitations, paroxysmal nocturnal dyspnea Dermatological: No rash, lesions or masses Respiratory: No cough, dyspnea Urologic: No hematuria, dysuria Abdominal: No nausea, vomiting, diarrhea, bright red blood per rectum, melena, or hematemesis Neurologic: No visual changes, weakness, changes in mental status All other systems reviewed and are otherwise negative except as noted above.  Physical Exam: Vitals:   02/22/19 1030  BP: (!) 172/60  Pulse: (!) 41  Weight: 167 lb (75.8 kg)  Height: 5\' 5"  (1.651 m)    GEN- The patient is well appearing, alert and oriented x 3 today.   HEENT: normocephalic, atraumatic; sclera clear, conjunctiva pink; hearing intact; oropharynx clear; neck supple, no JVP Lymph- no cervical lymphadenopathy Lungs- Clear to ausculation bilaterally, normal work of breathing.  No wheezes, rales, rhonchi Heart- Regular rate and rhythm, no murmurs, rubs or gallops, PMI not laterally displaced GI- soft, non-tender, non-distended, bowel sounds present, no hepatosplenomegaly Extremities- no clubbing, cyanosis, or edema; DP/PT/radial pulses 2+ bilaterally MS- no significant deformity or atrophy Skin- warm and dry, no rash or lesion Psych- euthymic mood, full affect Neuro- strength and sensation are intact  EKG is ordered. Personal review of EKG from today shows Sinus brady at 41 bpm with PR interval of 232 ms.   Assessment and Plan:  1. VT Pt shown to have RVOT VT (benign focal VT) in the setting of acute PTE with preserved EF.  No indication for ICD or further EP interventions  Continue amiodarone  200 mg daily for now.   2. Afib Echo 02/09/2019 showed LVEF 60-65%, with "severely dilated LA Suspect chronic nature EKG today shows marked sinus brady.  Continue Eliquis Continue amiodarone 200 mg daily for now. Will consider decrease further or stopping if bradycardia persists.  Stop lopressor.   3. HTN Lisinopril was discontinued on discharge. Will resume at 20 mg daily, especially with stopping his BB in setting of bradycardia.  Will have RN visit next week to recheck BP and bradycardia.   4. PTE On Eliquis. Encouraged compliance.   Pt not indicated for procedures regarding his VT with reversible cause. Now, however, he is having significant bradycardia. At this point,  he is relatively asymptomatic, and on BID Lopressor. Will stop lopressor and follow for indication for pacemaker. Discussed briefly today. Pt knows to return to hospital with any syncope.   Graciella FreerMichael Andrew Alvester Eads, PA-C  02/22/19 10:54 AM

## 2019-02-22 NOTE — Telephone Encounter (Signed)
Patient came to the facility to drop off paperwork to be filled out by PCP. Paperwork will be put in providers box and patient was informed it took 7-14 business days to be filled out.

## 2019-02-22 NOTE — Telephone Encounter (Signed)
Paperwork has been received and patient will be called once ready for pick up. 

## 2019-02-24 ENCOUNTER — Other Ambulatory Visit (HOSPITAL_COMMUNITY)
Admission: RE | Admit: 2019-02-24 | Discharge: 2019-02-24 | Disposition: A | Discharge status: Home / Self Care | Payer: 59 | Source: Ambulatory Visit | Attending: Family Medicine | Admitting: Family Medicine

## 2019-02-24 ENCOUNTER — Encounter: Payer: Self-pay | Admitting: Family Medicine

## 2019-02-24 ENCOUNTER — Other Ambulatory Visit: Payer: Self-pay

## 2019-02-24 ENCOUNTER — Ambulatory Visit (HOSPITAL_BASED_OUTPATIENT_CLINIC_OR_DEPARTMENT_OTHER): Payer: 59 | Admitting: Family Medicine

## 2019-02-24 VITALS — BP 173/58 | HR 50 | Temp 98.0°F | Ht 65.0 in | Wt 171.0 lb

## 2019-02-24 DIAGNOSIS — I472 Ventricular tachycardia, unspecified: Secondary | ICD-10-CM

## 2019-02-24 DIAGNOSIS — I10 Essential (primary) hypertension: Secondary | ICD-10-CM

## 2019-02-24 DIAGNOSIS — N401 Enlarged prostate with lower urinary tract symptoms: Secondary | ICD-10-CM

## 2019-02-24 DIAGNOSIS — R3911 Hesitancy of micturition: Secondary | ICD-10-CM | POA: Insufficient documentation

## 2019-02-24 DIAGNOSIS — I4891 Unspecified atrial fibrillation: Secondary | ICD-10-CM

## 2019-02-24 DIAGNOSIS — Z029 Encounter for administrative examinations, unspecified: Secondary | ICD-10-CM

## 2019-02-24 DIAGNOSIS — I2609 Other pulmonary embolism with acute cor pulmonale: Secondary | ICD-10-CM

## 2019-02-24 LAB — POCT URINALYSIS DIP (CLINITEK)
Bilirubin, UA: NEGATIVE
Blood, UA: NEGATIVE
Glucose, UA: NEGATIVE mg/dL
Ketones, POC UA: NEGATIVE mg/dL
Leukocytes, UA: NEGATIVE
Nitrite, UA: NEGATIVE
POC PROTEIN,UA: 30 — AB
Spec Grav, UA: >=1.03 — AB (ref 1.010–1.025)
Urobilinogen, UA: 0.2 E.U./dL
pH, UA: 6 (ref 5.0–8.0)

## 2019-02-24 NOTE — Progress Notes (Signed)
Subjective:  Patient ID: Jesus Morrison, male    DOB: 1927/03/04  Age: 85 y.o. MRN: [id number]  CC: Hypertension  Age: 85 y.o. MRN: [id number]  CC: Hypertension   HPI Jesus Morrison is a 85 -year-old male with a history of hypertension, history of BPH status post retropubic prostatectomy in 04/2012 who presents today for a follow-up visit. He had a recent hospitalization at [facility] from 02/09/2019 through 02/12/2019 where he was diagnosed with bilateral PE. He had presented with sudden onset dyspnea which commenced at night.  He was found to be in A. fib with RVR and subsequently developed V. tach for which amiodarone infusion was commenced. Lower extremity doppler revealed bilateral DVT for which he was started on heparin infusion and subsequently transitioned to Eliquis.  Echocardiogram revealed EF of 60 to 65%, moderately increased left ventricular hypertrophy, left atrium, right atrium mildly dilated, small pericardial effusion, trace MR no regional wall motion abnormality. Metoprolol was discontinued due to heart rate in the 40s to 50s; his antihypertensive regimen was changed per Cardiology.  He had a follow-up visit with the Cardiology PA 2 days ago at which time his blood pressure was 172/68 and heart rate was 41.  Lisinopril was restarted and he was scheduled to return for blood pressure check next week. He is doing well on Eliquis and denies bruising or bleeding.  Denies presence of chest pain, palpitations, dyspnea.  His blood pressure is elevated today and he endorses compliance with his antihypertensive. His brings in FMLA paperwork needing to be completed for his job.  Past Medical History:  Diagnosis Date  . Arthritis    fingers and hands  . BPH (benign prostatic hypertrophy)    problems with acute urinary retention- has indwelling foley catheter  . Cold (disease)    last week ( week of Dec 22- Dec 29 ) - no longer coughing, no fever and feels better now  . Enlarged prostate   . Hypertension     Past Surgical  History:  Procedure Laterality Date  .  Right elbow surgery as a child    . PROSTATECTOMY  04/17/2012   Procedure: PROSTATECTOMY RETROPUBIC;  Surgeon: Hanley Ben, MD;  Location: [facility];  Service: Urology;  Laterality: N/A;  Simple Retropubic Prostatectomy      Family History  Problem Relation Age of Onset  . Liver disease Father   . Heart disease Brother     No Known Allergies  Outpatient Medications Prior to Visit  Medication Sig Dispense Refill  . amiodarone (PACERONE) 200 MG tablet Take 1 tablet (200 mg total) by mouth daily. 30 tablet 0  . amLODipine (NORVASC) 10 MG tablet Take 1 tablet (10 mg total) by mouth daily. 90 tablet 1  . apixaban (ELIQUIS) 5 MG TABS tablet Take 1 tablet (5 mg total) by mouth 2 (two) times daily. 120 tablet 0  . cetirizine (ZYRTEC) 10 MG tablet Take 1 tablet (10 mg total) by mouth daily. 30 tablet 1  . hydrocortisone cream 0.5 % Apply 1 application topically 2 (two) times daily. 30 g 1  . lisinopril (ZESTRIL) 20 MG tablet Take 1 tablet (20 mg total) by mouth daily. 90 tablet 3  . Misc. Devices MISC Blood Pressure monitor Dx: Hypertension 1 each 0  . polyethylene glycol (MIRALAX / GLYCOLAX) packet Take 17 g by mouth daily. 14 each 0  . tamsulosin (FLOMAX) 0.4 MG CAPS capsule Take 1 capsule (0.4 mg total) by mouth daily. 90 capsule 1  . triamcinolone cream (KENALOG) 0.1 % Apply 1 application topically  2 (two) times daily. 30 g 1   No facility-administered medications prior to visit.      ROS Review of Systems  Constitutional: Negative for activity change and appetite change.  HENT: Negative for sinus pressure and sore throat.   Eyes: Negative for visual disturbance.  Respiratory: Negative for cough, chest tightness and shortness of breath.   Cardiovascular: Negative for chest pain and leg swelling.  Gastrointestinal: Negative for abdominal distention, abdominal pain, constipation and diarrhea.  Endocrine: Negative.   Genitourinary: Negative  for dysuria.  Musculoskeletal: Negative for joint swelling and myalgias.  Skin: Negative for rash.  Allergic/Immunologic: Negative.   Neurological: Negative for weakness, light-headedness and numbness.  Psychiatric/Behavioral: Negative for dysphoric mood and suicidal ideas.    Objective:  BP (!) 173/58   Pulse (!) 50   Temp 98 F (36.7 C) (Oral)   Ht 5\' 5"  (1.651 m)   Wt 171 lb (77.6 kg)   SpO2 98%   BMI 28.46 kg/m   BP/Weight 02/24/2019 02/22/2019 02/12/2019  Systolic BP 173 172 166  Diastolic BP 58 60 67  Wt. (Lbs) 171 167 -  BMI 28.46 27.79 -      Physical Exam Constitutional:      Appearance: He is well-developed.  Neck:     Vascular: No JVD.  Cardiovascular:     Rate and Rhythm: Bradycardia present.     Heart sounds: Normal heart sounds. No murmur.  Pulmonary:     Effort: Pulmonary effort is normal.     Breath sounds: Normal breath sounds. No wheezing or rales.  Chest:     Chest wall: No tenderness.  Abdominal:     General: Bowel sounds are normal. There is no distension.     Palpations: Abdomen is soft. There is no mass.     Tenderness: There is no abdominal tenderness.  Musculoskeletal: Normal range of motion.     Right lower leg: No edema.     Left lower leg: No edema.  Neurological:     Mental Status: He is alert and oriented to person, place, and time.  Psychiatric:        Mood and Affect: Mood normal.     CMP Latest Ref Rng & Units 02/12/2019 02/11/2019 02/10/2019  Glucose 70 - 99 mg/dL 02/12/2019) 093(G) 182(X)  BUN 8 - 23 mg/dL 937(J) 69(C) 13  Creatinine 0.61 - 1.24 mg/dL 78(L 3.81 0.17  Sodium 135 - 145 mmol/L 139 138 138  Potassium 3.5 - 5.1 mmol/L 4.3 4.3 3.6  Chloride 98 - 111 mmol/L 109 107 104  CO2 22 - 32 mmol/L 22 23 24   Calcium 8.9 - 10.3 mg/dL 5.10) 8.3(L) 8.5(L)  Total Protein 6.5 - 8.1 g/dL - - -  Total Bilirubin 0.3 - 1.2 mg/dL - - -  Alkaline Phos 38 - 126 U/L - - -  AST 15 - 41 U/L - - -  ALT 0 - 44 U/L - - -    Lipid Panel      Component Value Date/Time   CHOL 165 11/23/2018 0949   TRIG 39 02/09/2019 1009   HDL 58 11/23/2018 0949   CHOLHDL 2.8 11/23/2018 0949   CHOLHDL 2.6 10/27/2015 0943   VLDL 10 10/27/2015 0943   LDLCALC 98 11/23/2018 0949    CBC    Component Value Date/Time   WBC 8.0 02/11/2019 0316   RBC 4.47 02/11/2019 0316   HGB 13.1 02/11/2019 0316   HCT 39.4 02/11/2019 0316   PLT 186 02/11/2019  0316   MCV 88.1 02/11/2019 0316   MCH 29.3 02/11/2019 0316   MCHC 33.2 02/11/2019 0316   RDW 13.2 02/11/2019 0316   LYMPHSABS 1.2 02/09/2019 0950   MONOABS 0.4 02/09/2019 0950   EOSABS 0.0 02/09/2019 0950   BASOSABS 0.0 02/09/2019 0950    No results found for: HGBA1C  Assessment & Plan:   1. Benign prostatic hyperplasia with urinary hesitancy Doing well on Flomax He continues to request a penicillin injection which he usually received from his previous PCP UA is negative for UTI We will send out for urine ancillary and assured him I will get back to him with the result - Cervicovaginal ancillary only - POCT URINALYSIS DIP (CLINITEK)  2. Other pulmonary embolism with acute cor pulmonale, unspecified chronicity (HCC) Hospitalization for DVT and PE Currently on Eliquis He may benefit from chronic anticoagulation especially in the setting of new onset A. fib  3. V tach (HCC) In the setting of pulmonary thromboembolic 7 Currently on amiodarone He is bradycardic today Will be seeing cardiology next week  4. Essential hypertension Uncontrolled He endorses compliance with his medications We will hold off on adjusting regimen especially with his low diastolic value Regimen will be adjusted at his appointment next week with cardiology.  5. New onset atrial fibrillation (HCC) Currently in sinus rhythm Continue amiodarone Eliquis for anticoagulation  6. Administrative encounter Completed FMLA paperwork    No orders of the defined types were placed in this encounter.    Follow-up: Return in about 2 months (around 04/26/2019) for chronic medical conditions; cancel existing appointment.       Hoy RegisterEnobong Larua Collier, MD, FAAFP. [facility] [place], [place] [phone]   02/24/2019, 2:37 PM

## 2019-02-25 ENCOUNTER — Encounter: Payer: Self-pay | Admitting: Family Medicine

## 2019-02-25 DIAGNOSIS — Z23 Encounter for immunization: Secondary | ICD-10-CM | POA: Diagnosis not present

## 2019-02-25 LAB — CERVICOVAGINAL ANCILLARY ONLY
Chlamydia: NEGATIVE
Comment: NEGATIVE
Comment: NEGATIVE
Comment: NORMAL
Neisseria Gonorrhea: NEGATIVE
Trichomonas: NEGATIVE

## 2019-03-01 ENCOUNTER — Other Ambulatory Visit: Payer: Self-pay

## 2019-03-01 ENCOUNTER — Ambulatory Visit (INDEPENDENT_AMBULATORY_CARE_PROVIDER_SITE_OTHER): Payer: 59 | Admitting: Nurse Practitioner

## 2019-03-01 ENCOUNTER — Other Ambulatory Visit: Payer: Medicare Other | Admitting: *Deleted

## 2019-03-01 VITALS — BP 118/50 | HR 42 | Ht 65.0 in | Wt 167.5 lb

## 2019-03-01 DIAGNOSIS — I472 Ventricular tachycardia, unspecified: Secondary | ICD-10-CM

## 2019-03-01 DIAGNOSIS — I10 Essential (primary) hypertension: Secondary | ICD-10-CM

## 2019-03-01 LAB — BASIC METABOLIC PANEL
BUN/Creatinine Ratio: 13 (ref 10–24)
BUN: 15 mg/dL (ref 10–36)
CO2: 22 mmol/L (ref 20–29)
Calcium: 9.1 mg/dL (ref 8.6–10.2)
Chloride: 104 mmol/L (ref 96–106)
Creatinine, Ser: 1.15 mg/dL (ref 0.76–1.27)
GFR calc Af Amer: 64 mL/min/{1.73_m2} (ref >59)
GFR calc non Af Amer: 56 mL/min/{1.73_m2} — ABNORMAL LOW (ref >59)
Glucose: 86 mg/dL (ref 65–99)
Potassium: 4.1 mmol/L (ref 3.5–5.2)
Sodium: 143 mmol/L (ref 134–144)

## 2019-03-01 NOTE — Progress Notes (Signed)
1.) Reason for visit: Follow-up for  2.) Name of MD requesting visit: Oda Kilts, PA  3.) H&P: Patient presents for 1 week f/u.He is alert & oriented and answers questions appropriately. Advised by PA to stop metoprolol due to bradycardia and start lisinopril for BP management. Pt states he started the lisinopril the day of his visit on 11/9 and has not taken any metoprolol since that day. He drove himself here today.   4.) ROS related to problem: Patient denies complaints. When asked he admits that he has occasional dizziness. He does not give any further details. He ambulates without difficulty.   5.) Assessment and plan per MD: Reviewed with A. Chalmers Cater, [place] and Dr. Caryl Comes, DOD. Plan is to stop amiodarone and have patient return in approximately 6 weeks to see A. Tillery, PA in the office.

## 2019-03-01 NOTE — Patient Instructions (Addendum)
Medication Instructions:  Your physician has recommended you make the following change in your medication: STOP Amiodarone (Pacerone) 200 mg daily  *If you need a refill on your cardiac medications before your next appointment, please call your pharmacy*  Lab Work: BMET collected today If you have labs (blood work) drawn today and your tests are completely normal, you will receive your results only by: Marland Kitchen MyChart Message (if you have MyChart) OR . A paper copy in the mail If you have any lab test that is abnormal or we need to change your treatment, we will call you to review the results.  Testing/Procedures: None Ordered   Follow-Up: At [facility], you and your health needs are our priority.  As part of our continuing mission to provide you with exceptional heart care, we have created designated Provider Care Teams.  These Care Teams include your primary Cardiologist (physician) and Advanced Practice Providers (APPs -  Physician Assistants and Nurse Practitioners) who all work together to provide you with the care you need, when you need it.  Your next appointment:   6 weeks on Monday Dec.21 at 8:50 am+   The format for your next appointment:   In Person  Provider:   Joesph July, PA   .

## 2019-03-05 ENCOUNTER — Emergency Department (HOSPITAL_COMMUNITY): Payer: 59

## 2019-03-05 ENCOUNTER — Emergency Department (HOSPITAL_COMMUNITY)
Admission: EM | Admit: 2019-03-05 | Discharge: 2019-03-05 | Disposition: A | Discharge status: Home / Self Care | Payer: 59 | Attending: Emergency Medicine | Admitting: Emergency Medicine

## 2019-03-05 ENCOUNTER — Telehealth: Payer: Self-pay

## 2019-03-05 ENCOUNTER — Encounter (HOSPITAL_COMMUNITY): Payer: Self-pay

## 2019-03-05 ENCOUNTER — Other Ambulatory Visit: Payer: Self-pay

## 2019-03-05 DIAGNOSIS — N401 Enlarged prostate with lower urinary tract symptoms: Secondary | ICD-10-CM | POA: Insufficient documentation

## 2019-03-05 DIAGNOSIS — Z79899 Other long term (current) drug therapy: Secondary | ICD-10-CM | POA: Diagnosis not present

## 2019-03-05 DIAGNOSIS — R109 Unspecified abdominal pain: Secondary | ICD-10-CM | POA: Diagnosis present

## 2019-03-05 DIAGNOSIS — R300 Dysuria: Secondary | ICD-10-CM | POA: Insufficient documentation

## 2019-03-05 DIAGNOSIS — Z7901 Long term (current) use of anticoagulants: Secondary | ICD-10-CM | POA: Insufficient documentation

## 2019-03-05 DIAGNOSIS — I10 Essential (primary) hypertension: Secondary | ICD-10-CM | POA: Insufficient documentation

## 2019-03-05 DIAGNOSIS — N433 Hydrocele, unspecified: Secondary | ICD-10-CM

## 2019-03-05 DIAGNOSIS — R1032 Left lower quadrant pain: Secondary | ICD-10-CM | POA: Diagnosis not present

## 2019-03-05 LAB — CBC
HCT: 41.9 % (ref 39.0–52.0)
Hemoglobin: 13.6 g/dL (ref 13.0–17.0)
MCH: 29.5 pg (ref 26.0–34.0)
MCHC: 32.5 g/dL (ref 30.0–36.0)
MCV: 90.9 fL (ref 80.0–100.0)
Platelets: 207 10*3/uL (ref 150–400)
RBC: 4.61 MIL/uL (ref 4.22–5.81)
RDW: 13.6 % (ref 11.5–15.5)
WBC: 8.1 10*3/uL (ref 4.0–10.5)
nRBC: 0 % (ref 0.0–0.2)

## 2019-03-05 LAB — URINALYSIS, ROUTINE W REFLEX MICROSCOPIC
Bilirubin Urine: NEGATIVE
Glucose, UA: NEGATIVE mg/dL
Hgb urine dipstick: NEGATIVE
Ketones, ur: NEGATIVE mg/dL
Leukocytes,Ua: NEGATIVE
Nitrite: NEGATIVE
Protein, ur: NEGATIVE mg/dL
Specific Gravity, Urine: 1.011 (ref 1.005–1.030)
pH: 6 (ref 5.0–8.0)

## 2019-03-05 LAB — COMPREHENSIVE METABOLIC PANEL
ALT: 11 U/L (ref 0–44)
AST: 16 U/L (ref 15–41)
Albumin: 3.6 g/dL (ref 3.5–5.0)
Alkaline Phosphatase: 46 U/L (ref 38–126)
Anion gap: 11 (ref 5–15)
BUN: 19 mg/dL (ref 8–23)
CO2: 25 mmol/L (ref 22–32)
Calcium: 8.9 mg/dL (ref 8.9–10.3)
Chloride: 105 mmol/L (ref 98–111)
Creatinine, Ser: 1.28 mg/dL — ABNORMAL HIGH (ref 0.61–1.24)
GFR calc Af Amer: 57 mL/min — ABNORMAL LOW (ref >60)
GFR calc non Af Amer: 49 mL/min — ABNORMAL LOW (ref >60)
Glucose, Bld: 119 mg/dL — ABNORMAL HIGH (ref 70–99)
Potassium: 4.3 mmol/L (ref 3.5–5.1)
Sodium: 141 mmol/L (ref 135–145)
Total Bilirubin: 1.1 mg/dL (ref 0.3–1.2)
Total Protein: 6.6 g/dL (ref 6.5–8.1)

## 2019-03-05 LAB — LIPASE, BLOOD: Lipase: 31 U/L (ref 11–51)

## 2019-03-05 MED ORDER — SODIUM CHLORIDE 0.9 % IV BOLUS
500.0000 mL | Freq: Once | INTRAVENOUS | Status: AC
  Administered 2019-03-05: 500 mL via INTRAVENOUS

## 2019-03-05 MED ORDER — IOHEXOL 300 MG/ML  SOLN
100.0000 mL | Freq: Once | INTRAMUSCULAR | Status: AC | PRN
  Administered 2019-03-05: 100 mL via INTRAVENOUS

## 2019-03-05 NOTE — Discharge Instructions (Signed)
You have a hydrocele in your testicle. Avoid sitting too [place]   See urology for follow up   Return to ER if you have worse testicular pain, trouble urinating, abdominal pain, vomiting, fever

## 2019-03-05 NOTE — ED Provider Notes (Signed)
[facility]PART[facility] Provider Note   CSN: [id number] Arrival date & time: 03/05/19  1225     History   Chief Complaint Chief Complaint  Patient presents with  . Abdominal Pain    HPI Jesus Morrison is a 85 y.o. male hx of BPH, hypertension here presenting with dysuria, constipation .  Patient was recently admitted for bilateral PE is currently on Eliquis.  Patient states that he has been constipated and is complaining of some dysuria when he urinates.      The history is provided by the patient.    Past Medical History:  Diagnosis Date  . Arthritis    fingers and hands  . BPH (benign prostatic hypertrophy)    problems with acute urinary retention- has indwelling foley catheter  . Cold (disease)    last week ( week of Dec 22- Dec 29 ) - no longer coughing, no fever and feels better now  . Enlarged prostate   . Hypertension     Patient Active Problem List   Diagnosis Date Noted  . Leg DVT (deep venous thromboembolism), acute, bilateral ([place]) 02/12/2019  . V tach ([place]) 02/10/2019  . Pulmonary emboli ([place]) 02/10/2019  . New onset atrial fibrillation ([place])   . Bilateral pulmonary embolism ([place]) 02/09/2019  . Scrotal hernia 11/07/2017  . BPH (benign prostatic hyperplasia) 05/19/2017  . Bradycardia 01/26/2016  . Hypertension 08/25/2015    Past Surgical History:  Procedure Laterality Date  .  Right elbow surgery as a child    . PROSTATECTOMY  04/17/2012   Procedure: PROSTATECTOMY RETROPUBIC;  Surgeon: Hanley Ben, MD;  Location: [facility];  Service: Urology;  Laterality: N/A;  Simple Retropubic Prostatectomy          Home Medications    Prior to Admission medications   Medication Sig Start Date End Date Taking? Authorizing Provider  amLODipine (NORVASC) 10 MG tablet Take 1 tablet (10 mg total) by mouth daily. 11/23/18   Charlott Rakes, MD  apixaban (ELIQUIS) 5 MG TABS tablet Take 1 tablet (5 mg total) by mouth 2 (two) times daily.  02/18/19   Debbe Odea, MD  cetirizine (ZYRTEC) 10 MG tablet Take 1 tablet (10 mg total) by mouth daily. 02/12/18   Charlott Rakes, MD  hydrocortisone cream 0.5 % Apply 1 application topically 2 (two) times daily. 05/16/17   Charlott Rakes, MD  lisinopril (ZESTRIL) 20 MG tablet Take 1 tablet (20 mg total) by mouth daily. 02/22/19 05/23/19  Shirley Friar, PA-C  Misc. Devices MISC Blood Pressure monitor Dx: Hypertension 08/19/18   Charlott Rakes, MD  polyethylene glycol (MIRALAX / GLYCOLAX) packet Take 17 g by mouth daily. 09/19/17   Khatri, Hina, PA-C  tamsulosin (FLOMAX) 0.4 MG CAPS capsule Take 1 capsule (0.4 mg total) by mouth daily. 11/23/18   Charlott Rakes, MD  triamcinolone cream (KENALOG) 0.1 % Apply 1 application topically 2 (two) times daily. 05/20/18   Charlott Rakes, MD    Family History Family History  Problem Relation Age of Onset  . Liver disease Father   . Heart disease Brother     Social History Social History   Tobacco Use  . Smoking status: Never Smoker  . Smokeless tobacco: Never Used  Substance Use Topics  . Alcohol use: Yes    Alcohol/week: 4.0 - 6.0 standard drinks    Types: 3 - 4 Cans of beer, 1 - 2 Shots of liquor per week    Comment: "weekends"  . Drug use: No  Allergies   Patient has no known allergies.   Review of Systems Review of Systems  Gastrointestinal: Positive for abdominal pain.  Genitourinary: Positive for dysuria.  All other systems reviewed and are negative.    Physical Exam Updated Vital Signs BP (!) 152/67   Pulse (!) 48   Temp 98 F (36.7 C) (Oral)   Resp 20   SpO2 99%   Physical Exam Vitals signs and nursing note reviewed.  HENT:     Head: Normocephalic.  Eyes:     Extraocular Movements: Extraocular movements intact.  Cardiovascular:     Rate and Rhythm: Normal rate and regular rhythm.     Heart sounds: Normal heart sounds.  Pulmonary:     Effort: Pulmonary effort is normal.     Breath sounds: Normal  breath sounds.  Abdominal:     General: Abdomen is flat.     Palpations: Abdomen is soft.     Comments: Mild LLQ tenderness   Genitourinary:    Comments: No obvious ballanitis. + R hydrocele, no scrotal tenderness and no signs of cellulitis  Skin:    General: Skin is warm.     Capillary Refill: Capillary refill takes less than 2 seconds.  Neurological:     General: No focal deficit present.     Mental Status: He is alert and oriented to person, place, and time.  Psychiatric:        Mood and Affect: Mood normal.        Behavior: Behavior normal.      ED Treatments / Results  Labs (all labs ordered are listed, but only abnormal results are displayed) Labs Reviewed  COMPREHENSIVE METABOLIC PANEL - Abnormal; Notable for the following components:      Result Value   Glucose, Bld 119 (*)    Creatinine, Ser 1.28 (*)    GFR calc non Af Amer 49 (*)    GFR calc Af Amer 57 (*)    All other components within normal limits  URINALYSIS, ROUTINE W REFLEX MICROSCOPIC - Abnormal; Notable for the following components:   Color, Urine STRAW (*)    All other components within normal limits  URINE CULTURE  LIPASE, BLOOD  CBC    EKG None  Radiology Ct Abdomen Pelvis W Contrast  Result Date: 03/05/2019 CLINICAL DATA:  Lower abdominal pain and penile pain for several months. EXAM: CT ABDOMEN AND PELVIS WITH CONTRAST TECHNIQUE: Multidetector CT imaging of the abdomen and pelvis was performed using the standard protocol following bolus administration of intravenous contrast. CONTRAST:  OMNIPAQUE IOHEXOL 300 MG/ML  SOLN COMPARISON:  Abdominal radiographs from earlier today. FINDINGS: Lower chest: No significant pulmonary nodules or acute consolidative airspace disease. Hepatobiliary: Normal liver size. No liver mass. Cholelithiasis. No gallbladder wall thickening or pericholecystic fluid. No biliary ductal dilatation. Pancreas: Normal, with no mass or duct dilation. Spleen: Normal size. No  mass. Adrenals/Urinary Tract: Normal adrenals. Simple 2.3 cm anterior upper left renal cyst. Additional subcentimeter hypodense renal cortical lesions scattered in the left kidney are too small to characterize and require no follow-up. Small simple parapelvic left renal cysts. No hydronephrosis. Normal bladder. Stomach/Bowel: Normal non-distended stomach. Normal caliber small bowel with no small bowel wall thickening. Normal appendix. Normal large bowel with no diverticulosis, large bowel wall thickening or pericolonic fat stranding. Vascular/Lymphatic: Atherosclerotic nonaneurysmal abdominal aorta. Patent portal, splenic, hepatic and renal veins. No pathologically enlarged lymph nodes in the abdomen or pelvis. Reproductive: Prominent prostatomegaly with mass-effect on the bladder base. Large  right hydrocele. Other: No pneumoperitoneum, ascites or focal fluid collection. Musculoskeletal: No aggressive appearing focal osseous lesions. Marked thoracolumbar spondylosis. IMPRESSION: 1. Large right hydrocele. 2. No evidence of bowel obstruction or acute bowel inflammation. Normal appendix. 3. Cholelithiasis. 4. Prominently enlarged prostate. 5.  Aortic Atherosclerosis (ICD10-I70.0). Electronically Signed   By: Delbert PhenixJason A Poff M.D.   On: 03/05/2019 17:58   Dg Abd Acute W/chest  Result Date: 03/05/2019 CLINICAL DATA:  Abdominal pain EXAM: DG ABDOMEN ACUTE W/ 1V CHEST COMPARISON:  Chest 02/09/2019 FINDINGS: Cardiac enlargement without heart failure. Lungs are clear without infiltrate or effusion Normal bowel gas pattern. No obstruction or free air. No urinary tract calculi. Mild degenerative changes lumbar spine. IMPRESSION: Negative abdominal radiographs.  No acute cardiopulmonary disease. Electronically Signed   By: Marlan Palauharles  Clark M.D.   On: 03/05/2019 16:18    Procedures Procedures (including critical care time)  Medications Ordered in ED Medications  sodium chloride 0.9 % bolus 500 mL (0 mLs Intravenous  Stopped 03/05/19 1719)  iohexol (OMNIPAQUE) 300 MG/ML solution 100 mL (100 mLs Intravenous Contrast Given 03/05/19 1709)     Initial Impression / Assessment and Plan / ED Course  I have reviewed the triage vital signs and the nursing notes.  Pertinent labs & imaging results that were available during my care of the patient were reviewed by me and considered in my medical decision making (see chart for details).       Cornell BarmanGentry Striplin is a 85 y.o. male here presenting with abdominal pain, constipation, dysuria .  Patient has been having some lower abdominal pain since this morning as well as some dysuria.  Patient is hypertensive and was recently admitted for pulmonary embolus and is currently on Eliquis.  I think likely diverticulitis or ileus or UTI. Will get labs, CT ab/pel, UA.   7:15 PM Labs and CT and UA unremarkable. Pain likely from R hydrocele. No tenderness on testicular exam and I doubt torsion. No signs of scrotal cellulitis. Told him to avoid sitting too much and he can follow up with urology outpatient    Final Clinical Impressions(s) / ED Diagnoses   Final diagnoses:  None    ED Discharge Orders    None       Charlynne PanderYao, Kaidynce Pfister Hsienta, MD 03/05/19 [id number]

## 2019-03-05 NOTE — ED Notes (Signed)
Patient transported to CT 

## 2019-03-05 NOTE — ED Triage Notes (Addendum)
Pt reports he ate breakfast this morning and afterwards he started having abdominal pain and pain in his penis. Pt denies N/V. Pt states his bowels aren't moving like they should. Pt reports burning with urination

## 2019-03-05 NOTE — Telephone Encounter (Signed)
-----   Message from Charlott Rakes, MD sent at 02/27/2019  3:16 PM EST ----- Urine ancillary test is normal

## 2019-03-05 NOTE — ED Notes (Signed)
Spoke w/pt's daughter about pt's status and why he's here after receiving permission from pt.

## 2019-03-05 NOTE — Telephone Encounter (Signed)
Patient was called and a voicemail was left informing patient to return phone call for lab results. 

## 2019-03-06 LAB — URINE CULTURE: Culture: NO GROWTH

## 2019-03-08 ENCOUNTER — Other Ambulatory Visit: Payer: Self-pay | Admitting: Student

## 2019-03-08 ENCOUNTER — Encounter (HOSPITAL_COMMUNITY): Payer: Self-pay | Admitting: Emergency Medicine

## 2019-03-08 ENCOUNTER — Telehealth: Payer: Self-pay | Admitting: Student

## 2019-03-08 ENCOUNTER — Emergency Department (HOSPITAL_COMMUNITY)
Admission: EM | Admit: 2019-03-08 | Discharge: 2019-03-08 | Disposition: A | Discharge status: Home / Self Care | Payer: 59 | Attending: Emergency Medicine | Admitting: Emergency Medicine

## 2019-03-08 ENCOUNTER — Other Ambulatory Visit: Payer: Self-pay

## 2019-03-08 ENCOUNTER — Emergency Department (HOSPITAL_COMMUNITY)
Admission: EM | Admit: 2019-03-08 | Discharge: 2019-03-08 | Disposition: A | Discharge status: Home / Self Care | Payer: 59 | Source: Home / Self Care | Attending: Emergency Medicine | Admitting: Emergency Medicine

## 2019-03-08 DIAGNOSIS — Z79899 Other long term (current) drug therapy: Secondary | ICD-10-CM | POA: Diagnosis not present

## 2019-03-08 DIAGNOSIS — I480 Paroxysmal atrial fibrillation: Secondary | ICD-10-CM

## 2019-03-08 DIAGNOSIS — K5900 Constipation, unspecified: Secondary | ICD-10-CM | POA: Insufficient documentation

## 2019-03-08 DIAGNOSIS — N3943 Post-void dribbling: Secondary | ICD-10-CM

## 2019-03-08 DIAGNOSIS — K5909 Other constipation: Secondary | ICD-10-CM

## 2019-03-08 DIAGNOSIS — M25559 Pain in unspecified hip: Secondary | ICD-10-CM

## 2019-03-08 DIAGNOSIS — N401 Enlarged prostate with lower urinary tract symptoms: Secondary | ICD-10-CM

## 2019-03-08 DIAGNOSIS — I10 Essential (primary) hypertension: Secondary | ICD-10-CM | POA: Insufficient documentation

## 2019-03-08 DIAGNOSIS — Z7901 Long term (current) use of anticoagulants: Secondary | ICD-10-CM | POA: Insufficient documentation

## 2019-03-08 DIAGNOSIS — M25551 Pain in right hip: Secondary | ICD-10-CM | POA: Insufficient documentation

## 2019-03-08 DIAGNOSIS — R3911 Hesitancy of micturition: Secondary | ICD-10-CM

## 2019-03-08 LAB — CBC WITH DIFFERENTIAL/PLATELET
Abs Immature Granulocytes: 0.02 10*3/uL (ref 0.00–0.07)
Basophils Absolute: 0.1 10*3/uL (ref 0.0–0.1)
Basophils Relative: 1 %
Eosinophils Absolute: 0.2 10*3/uL (ref 0.0–0.5)
Eosinophils Relative: 2 %
HCT: 42.6 % (ref 39.0–52.0)
Hemoglobin: 13.6 g/dL (ref 13.0–17.0)
Immature Granulocytes: 0 %
Lymphocytes Relative: 32 %
Lymphs Abs: 2.6 10*3/uL (ref 0.7–4.0)
MCH: 28.9 pg (ref 26.0–34.0)
MCHC: 31.9 g/dL (ref 30.0–36.0)
MCV: 90.4 fL (ref 80.0–100.0)
Monocytes Absolute: 0.9 10*3/uL (ref 0.1–1.0)
Monocytes Relative: 11 %
Neutro Abs: 4.3 10*3/uL (ref 1.7–7.7)
Neutrophils Relative %: 54 %
Platelets: 222 10*3/uL (ref 150–400)
RBC: 4.71 MIL/uL (ref 4.22–5.81)
RDW: 13.5 % (ref 11.5–15.5)
WBC: 7.9 10*3/uL (ref 4.0–10.5)
nRBC: 0 % (ref 0.0–0.2)

## 2019-03-08 LAB — COMPREHENSIVE METABOLIC PANEL
ALT: 10 U/L (ref 0–44)
AST: 13 U/L — ABNORMAL LOW (ref 15–41)
Albumin: 3.7 g/dL (ref 3.5–5.0)
Alkaline Phosphatase: 47 U/L (ref 38–126)
Anion gap: 10 (ref 5–15)
BUN: 11 mg/dL (ref 8–23)
CO2: 25 mmol/L (ref 22–32)
Calcium: 9 mg/dL (ref 8.9–10.3)
Chloride: 105 mmol/L (ref 98–111)
Creatinine, Ser: 1.09 mg/dL (ref 0.61–1.24)
GFR calc Af Amer: >60 mL/min (ref >60)
GFR calc non Af Amer: 59 mL/min — ABNORMAL LOW (ref >60)
Glucose, Bld: 100 mg/dL — ABNORMAL HIGH (ref 70–99)
Potassium: 3.7 mmol/L (ref 3.5–5.1)
Sodium: 140 mmol/L (ref 135–145)
Total Bilirubin: 1.4 mg/dL — ABNORMAL HIGH (ref 0.3–1.2)
Total Protein: 7 g/dL (ref 6.5–8.1)

## 2019-03-08 MED ORDER — AMLODIPINE BESYLATE 5 MG PO TABS
10.0000 mg | ORAL_TABLET | Freq: Once | ORAL | Status: AC
  Administered 2019-03-08: 5 mg via ORAL
  Filled 2019-03-08: qty 2

## 2019-03-08 MED ORDER — POLYETHYLENE GLYCOL 3350 17 G PO PACK: 17.0000 g | PACK | Freq: Two times a day (BID) | ORAL | 0 refills | Status: DC

## 2019-03-08 MED ORDER — DOCUSATE SODIUM 100 MG PO CAPS: 100.0000 mg | ORAL_CAPSULE | Freq: Two times a day (BID) | ORAL | 0 refills | Status: DC

## 2019-03-08 MED ORDER — TAMSULOSIN HCL 0.4 MG PO CAPS: 0.4000 mg | ORAL_CAPSULE | Freq: Every day | ORAL | 1 refills | Status: DC

## 2019-03-08 MED ORDER — LISINOPRIL 20 MG PO TABS
20.0000 mg | ORAL_TABLET | Freq: Once | ORAL | Status: AC
  Administered 2019-03-08: 20 mg via ORAL
  Filled 2019-03-08: qty 1

## 2019-03-08 MED ORDER — APIXABAN 5 MG PO TABS: 5.0000 mg | ORAL_TABLET | Freq: Two times a day (BID) | ORAL | 5 refills | Status: DC

## 2019-03-08 NOTE — ED Triage Notes (Signed)
Patient states has a history of right hip pain and his doctor gives him a penicillin tablet to help. Asked patient about doctor he said he past away 2 years ago.  States no pain at this time and sometimes the pain doesn't hurt all the time. Ambulatory with out cane or assistance. Patient seen this morning and came back for this complaint. Has no complaints at this time.

## 2019-03-08 NOTE — ED Notes (Signed)
Spoke with Sophia PA no orders to be placed at this time.

## 2019-03-08 NOTE — ED Provider Notes (Signed)
Emergency Department Provider Note   I have reviewed the triage vital signs and the nursing notes.   HISTORY  Chief Complaint Hip Pain   HPI Jesus Morrison is a 85 y.o. male who presents to the emergency department to "get a shot in my hip".  Patient states that sometimes he has had pain and his PCP used to do it but now his PCP is dead.  He wants me to do it.  He says he does not care which she had.  No recent injuries.  No fevers.  No pain at this time.  No other complaints at this time.   No other associated or modifying symptoms.    Past Medical History:  Diagnosis Date  . Arthritis    fingers and hands  . BPH (benign prostatic hypertrophy)    problems with acute urinary retention- has indwelling foley catheter  . Cold (disease)    last week ( week of Dec 22- Dec 29 ) - no longer coughing, no fever and feels better now  . Enlarged prostate   . Hypertension     Patient Active Problem List   Diagnosis Date Noted  . Leg DVT (deep venous thromboembolism), acute, bilateral ([place]) 02/12/2019  . V tach ([place]) 02/10/2019  . Pulmonary emboli ([place]) 02/10/2019  . New onset atrial fibrillation ([place])   . Bilateral pulmonary embolism ([place]) 02/09/2019  . Scrotal hernia 11/07/2017  . BPH (benign prostatic hyperplasia) 05/19/2017  . Bradycardia 01/26/2016  . Hypertension 08/25/2015    Past Surgical History:  Procedure Laterality Date  .  Right elbow surgery as a child    . PROSTATECTOMY  04/17/2012   Procedure: PROSTATECTOMY RETROPUBIC;  Surgeon: Hanley Ben, MD;  Location: [facility];  Service: Urology;  Laterality: N/A;  Simple Retropubic Prostatectomy      Current Outpatient Rx  . Order #: [id number] Class: Normal  . Order #: [id number] Class: Normal  . Order #: [id number] Class: Normal  . Order #: [id number] Class: Normal  . Order #: [id number] Class: Normal  . Order #: [id number] Class: Normal  . Order #: [id number] Class: Print  . Order #: [id number] Class: Normal  . Order #:  [id number] Class: Normal  . Order #: [id number] Class: Normal    Allergies Patient has no known allergies.  Family History  Problem Relation Age of Onset  . Liver disease Father   . Heart disease Brother     Social History Social History   Tobacco Use  . Smoking status: Never Smoker  . Smokeless tobacco: Never Used  Substance Use Topics  . Alcohol use: Yes  . Drug use: No    Review of Systems  All other systems negative except as documented in the HPI. All pertinent positives and negatives as reviewed in the HPI. ____________________________________________   PHYSICAL EXAM:  VITAL SIGNS: ED Triage Vitals  Enc Vitals Group     BP 03/08/19 1304 (!) 161/65     Pulse Rate 03/08/19 1304 64     Resp 03/08/19 1304 16     Temp 03/08/19 1304 98 F (36.7 C)     Temp Source 03/08/19 1304 Oral     SpO2 03/08/19 1304 99 %     Weight 03/08/19 1318 160 lb (72.6 kg)     Height 03/08/19 1318 5\' 5"  (1.651 m)     Head Circumference --      Peak Flow --      Pain Score 03/08/19 1318 0     Pain Loc --  Pain Edu? --      Excl. in GC? --     Constitutional: Alert and oriented. Well appearing and in no acute distress. Eyes: Conjunctivae are normal. PERRL. EOMI. Head: Atraumatic. Nose: No congestion/rhinnorhea. Mouth/Throat: Mucous membranes are moist.  Oropharynx non-erythematous. Neck: No stridor.  No meningeal signs.   Cardiovascular: Normal rate, regular rhythm. Good peripheral circulation. Grossly normal heart sounds.   Respiratory: Normal respiratory effort.  No retractions. Lungs CTAB. Gastrointestinal: Soft and nontender. No distention.  Musculoskeletal: No lower extremity tenderness nor edema. No gross deformities of extremities. Neurologic:  Normal speech and language. No gross focal neurologic deficits are appreciated.  Skin:  Skin is warm, dry and intact. No rash noted.   ____________________________________________   INITIAL IMPRESSION / ASSESSMENT AND PLAN /  ED COURSE  Patient no distress with no emergent needs at this time.  I discussed having case management or social work try to help him get a new primary doctor but he states he has a number already that he wants to call first.  No indication for imaging or further work-up at this time.  Discharged stable condition.  Pertinent labs & imaging results that were available during my care of the patient were reviewed by me and considered in my medical decision making (see chart for details).  A medical screening exam was performed and I feel the patient has had an appropriate workup for their chief complaint at this time and likelihood of emergent condition existing is low. They have been counseled on decision, discharge, follow up and which symptoms necessitate immediate return to the emergency department. They or their family verbally stated understanding and agreement with plan and discharged in stable condition.   ____________________________________________  FINAL CLINICAL IMPRESSION(S) / ED DIAGNOSES  Final diagnoses:  Hip pain    MEDICATIONS GIVEN DURING THIS VISIT:  Medications - No data to display   NEW OUTPATIENT MEDICATIONS STARTED DURING THIS VISIT:  Discharge Medication List as of 03/08/2019  5:04 PM    START taking these medications   Details  apixaban (ELIQUIS) 5 MG TABS tablet Take 1 tablet (5 mg total) by mouth 2 (two) times daily., Starting Mon 03/08/2019, Normal        Note:  This note was prepared with assistance of Dragon voice recognition software. Occasional wrong-word or sound-a-like substitutions may have occurred due to the inherent limitations of voice recognition software.   Marily Memos, MD 03/08/19 2223

## 2019-03-08 NOTE — ED Triage Notes (Signed)
Patient reports constipation for 3 days unrelieved by Lactulose , denies abdominal pain , no fever or diarrhea.

## 2019-03-08 NOTE — Discharge Instructions (Addendum)
I recommend that you increase your water and fiber intake. If you are not able to eat foods high in fiber, you may use Benefiber or Metamucil over-the-counter. I also recommend you use MiraLAX 1-2 times a day and Colace 100 mg twice a day to help with bowel movements. These medications are over the counter.  You may use other over-the-counter medications such as Dulcolax, Fleet enemas, magnesium citrate as needed for constipation. Please note that some of these medications may cause you to have abdominal cramping which is normal. If you develop severe abdominal pain, fever (temperature of 100.4 or higher), persistent vomiting, distention of your abdomen, unable to have a bowel movement for 5 days or are not passing gas, please return to the hospital.  Please continue your amlodipine and lisinopril for your blood pressure as prescribed.  I have sent a prescription of Flomax to your pharmacy that you may take to help with your difficulty with urination which is secondary to your enlarged prostate.

## 2019-03-08 NOTE — ED Notes (Signed)
Pt attempted to urinate. Unable to void at this time. 

## 2019-03-08 NOTE — Telephone Encounter (Signed)
New message     Patients daughter Shirlean Mylar calling to clarify what medications patient should be taking. Please call

## 2019-03-08 NOTE — ED Provider Notes (Addendum)
TIME SEEN: 6:31 AM  CHIEF COMPLAINT: Constipation  HPI: Patient is a 85 year old male with history of BPH, hypertension who presents to the emergency department with complaints of constipation.  States his last bowel movement was 2 days ago.  Reports he is passing gas.  No nausea or vomiting.  No fever.  No abdominal pain.  Has been taking lactulose at home that was previously prescribed to him without much relief.  Was seen here on 03/05/2019 and had a CT of the abdomen pelvis with contrast that showed a large right hydrocele, no bowel obstruction or bowel inflammation, normal appendix, cholelithiasis, BPH.  Labs, urine, urine culture also unremarkable.  ROS: See HPI Constitutional: no fever  Eyes: no drainage  ENT: no runny nose   Cardiovascular:  no chest pain  Resp: no SOB  GI: no vomiting GU: no dysuria Integumentary: no rash  Allergy: no hives  Musculoskeletal: no leg swelling  Neurological: no slurred speech ROS otherwise negative  PAST MEDICAL HISTORY/PAST SURGICAL HISTORY:  Past Medical History:  Diagnosis Date  . Arthritis    fingers and hands  . BPH (benign prostatic hypertrophy)    problems with acute urinary retention- has indwelling foley catheter  . Cold (disease)    last week ( week of Dec 22- Dec 29 ) - no longer coughing, no fever and feels better now  . Enlarged prostate   . Hypertension     MEDICATIONS:  Prior to Admission medications   Medication Sig Start Date End Date Taking? Authorizing Provider  amLODipine (NORVASC) 10 MG tablet Take 1 tablet (10 mg total) by mouth daily. 11/23/18   Hoy Register, MD  apixaban (ELIQUIS) 5 MG TABS tablet Take 1 tablet (5 mg total) by mouth 2 (two) times daily. 02/18/19   Calvert Cantor, MD  cetirizine (ZYRTEC) 10 MG tablet Take 1 tablet (10 mg total) by mouth daily. 02/12/18   Hoy Register, MD  hydrocortisone cream 0.5 % Apply 1 application topically 2 (two) times daily. 05/16/17   Hoy Register, MD  lisinopril  (ZESTRIL) 20 MG tablet Take 1 tablet (20 mg total) by mouth daily. 02/22/19 05/23/19  Graciella Freer, PA-C  Misc. Devices MISC Blood Pressure monitor Dx: Hypertension 08/19/18   Hoy Register, MD  polyethylene glycol (MIRALAX / GLYCOLAX) packet Take 17 g by mouth daily. 09/19/17   Khatri, Hina, PA-C  tamsulosin (FLOMAX) 0.4 MG CAPS capsule Take 1 capsule (0.4 mg total) by mouth daily. 11/23/18   Hoy Register, MD  triamcinolone cream (KENALOG) 0.1 % Apply 1 application topically 2 (two) times daily. 05/20/18   Hoy Register, MD    ALLERGIES:  No Known Allergies  SOCIAL HISTORY:  Social History   Tobacco Use  . Smoking status: Never Smoker  . Smokeless tobacco: Never Used  Substance Use Topics  . Alcohol use: Yes    Alcohol/week: 4.0 - 6.0 standard drinks    Types: 3 - 4 Cans of beer, 1 - 2 Shots of liquor per week    Comment: "weekends"    FAMILY HISTORY: Family History  Problem Relation Age of Onset  . Liver disease Father   . Heart disease Brother     EXAM: BP (!) 184/76 (BP Location: Right Arm)   Pulse 64   Temp 98.3 F (36.8 C) (Oral)   Resp 18   SpO2 100%  CONSTITUTIONAL: Alert and oriented and responds appropriately to questions. Well-appearing; well-nourished, elderly, in no distress HEAD: Normocephalic EYES: Conjunctivae clear, pupils appear equal, EOM  appear intact ENT: normal nose; moist mucous membranes NECK: Supple, normal ROM CARD: RRR; S1 and S2 appreciated; no murmurs, no clicks, no rubs, no gallops RESP: Normal chest excursion without splinting or tachypnea; breath sounds clear and equal bilaterally; no wheezes, no rhonchi, no rales, no hypoxia or respiratory distress, speaking full sentences ABD/GI: Normal bowel sounds; non-distended; soft, non-tender, no rebound, no guarding, no peritoneal signs, no hepatosplenomegaly GU:  Normal external genitalia, circumcised male, normal penile shaft, no blood or discharge at the urethral meatus, R sided  hydrocele, no tenderness on exam, no hernias appreciated, 2+ femoral pulses bilaterally; no perineal erythema, warmth, subcutaneous air or crepitus; no high riding testicle, normal bilateral cremasteric reflex.  Chaperone present for exam. RECTAL:  Normal rectal tone, no gross blood or melena, no hemorrhoids appreciated, nontender rectal exam, no fecal impaction, no stool in the rectal vault BACK:  The back appears normal EXT: Normal ROM in all joints; no deformity noted, no edema; no cyanosis SKIN: Normal color for age and race; warm; no rash on exposed skin NEURO: Moves all extremities equally PSYCH: The patient's mood and manner are appropriate.   MEDICAL DECISION MAKING: Patient here with constipation.  He has no stool in the rectal vault, no impaction.  I do not feel an enema here will be helpful.  Have recommended MiraLAX and Colace over-the-counter.  Plan was to discharge patient home.  Abdominal exam completely benign.  Doubt bowel obstruction, perforation, colitis, diverticulitis, pyelonephritis, appendicitis.  ED PROGRESS: On discharge, patient's blood pressure noted to be elevated.  No chest pain, shortness of breath, headache, vision changes, numbness, tingling or focal weakness.  Will give his home amlodipine, lisinopril and check a manual blood pressure.  States he has not had his blood pressure medications this morning.  Not sure what his blood pressure normally runs.  Also told nursing staff that he was having penile discharge.  He denies this to me.  States that he is just having difficulty with urinating, starting a stream and will have dribbling intermittently.  Suspect this is from his BPH.  He is unsure if he is on his Flomax.  Will refill this medication.  Urinalysis, urine culture from November 20 were normal with no growth.  Will send urine gonorrhea and chlamydia test and repeat  urine culture today.  I do not feel he needs antibiotics currently.  His GU exam is benign.  He  reports it has been almost a year since he was last sexually active.  No history of STDs.   7:25 AM  Pt's exam continues to be benign.  Blood pressure is now in the 180s/60s.  I feel he is safe to be discharged home.   At this time, I do not feel there is any life-threatening condition present. I have reviewed, interpreted and discussed all results (EKG, imaging, lab, urine as appropriate) and exam findings with patient/family. I have reviewed nursing notes and appropriate previous records.  I feel the patient is safe to be discharged home without further emergent workup and can continue workup as an outpatient as needed. Discussed usual and customary return precautions. Patient/family verbalize understanding and are comfortable with this plan.  Outpatient follow-up has been provided as needed. All questions have been answered.    Jesus Morrison was evaluated in Emergency Department on 03/08/2019 for the symptoms described in the history of present illness. He was evaluated in the context of the global COVID-19 pandemic, which necessitated consideration that the patient might be at risk  for infection with the SARS-CoV-2 virus that causes COVID-19. Institutional protocols and algorithms that pertain to the evaluation of patients at risk for COVID-19 are in a state of rapid change based on information released by regulatory bodies including the CDC and federal and state organizations. These policies and algorithms were followed during the patient's care in the ED.  Patient was seen wearing N95, face shield, gloves.     Ward, Delice Bison, DO 03/08/19 [phone]

## 2019-03-08 NOTE — Telephone Encounter (Signed)
Daughter called with confusion over patients medication.  He saw [place] and stopped his toprol, and subsequently stopped his amiodarone due to bradycardia.   His daughter is not sure he stopped either one.   We will have him hold these for now. Refill for Eliquis requested, and I have sent in.   With medication confusion, can we please move his appointment with me up to the week of 12/9 pending availability?   His daughter will need to be made aware of his appointment. Shirlean Mylar)  Thank you!  Legrand Como [address] [place], PA-C  03/08/2019 2:48 PM

## 2019-03-09 LAB — URINE CULTURE: Culture: <10000 — AB

## 2019-03-10 ENCOUNTER — Inpatient Hospital Stay: Payer: 59 | Admitting: Family Medicine

## 2019-03-16 NOTE — Telephone Encounter (Signed)
See phone note, addressed 11/23.

## 2019-03-24 ENCOUNTER — Ambulatory Visit: Payer: 59 | Admitting: Student

## 2019-03-25 ENCOUNTER — Telehealth: Payer: Self-pay | Admitting: Family Medicine

## 2019-03-25 ENCOUNTER — Encounter: Payer: Self-pay | Admitting: Student

## 2019-03-25 ENCOUNTER — Ambulatory Visit (INDEPENDENT_AMBULATORY_CARE_PROVIDER_SITE_OTHER): Payer: 59 | Admitting: Student

## 2019-03-25 ENCOUNTER — Telehealth: Payer: Self-pay

## 2019-03-25 ENCOUNTER — Other Ambulatory Visit: Payer: Self-pay

## 2019-03-25 VITALS — BP 138/68 | HR 69 | Ht 65.0 in | Wt 172.0 lb

## 2019-03-25 DIAGNOSIS — I480 Paroxysmal atrial fibrillation: Secondary | ICD-10-CM | POA: Diagnosis not present

## 2019-03-25 DIAGNOSIS — R001 Bradycardia, unspecified: Secondary | ICD-10-CM

## 2019-03-25 DIAGNOSIS — I472 Ventricular tachycardia, unspecified: Secondary | ICD-10-CM

## 2019-03-25 DIAGNOSIS — I10 Essential (primary) hypertension: Secondary | ICD-10-CM | POA: Diagnosis not present

## 2019-03-25 NOTE — Progress Notes (Signed)
PCP:  Hoy Register, MD Primary Cardiologist: Armanda Magic, MD Electrophysiologist: Dr. Derinda Late Jesus is a 85 y.o. 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 They are seen for Dr. Johney Frame.   Since last being seen in our clinic, the patient reports doing very well. He denies symptoms of palpitations, chest pain, shortness of breath, orthopnea, PND, lower extremity edema, claudication, dizziness, presyncope, syncope, bleeding, or neurologic sequela. The patient is tolerating medications without difficulties.    The patient feels that he is tolerating medications without difficulties and is otherwise without complaint today.   Past Medical History:  Diagnosis Date  . Arthritis    fingers and hands  . BPH (benign prostatic hypertrophy)    problems with acute urinary retention- has indwelling foley catheter  . Cold (disease)    last week ( week of Dec 22- Dec 29 ) - no longer coughing, no fever and feels better now  . Enlarged prostate   . Hypertension    Past Surgical History:  Procedure Laterality Date  .  Right elbow surgery as a child    . PROSTATECTOMY  04/17/2012   Procedure: PROSTATECTOMY RETROPUBIC;  Surgeon: Lindaann Slough, MD;  Location: [facility];  Service: Urology;  Laterality: N/A;  Simple Retropubic Prostatectomy      Current Outpatient Medications  Medication Sig Dispense Refill  . amLODipine (NORVASC) 10 MG tablet Take 1 tablet (10 mg total) by mouth daily. 90 tablet 1  . apixaban (ELIQUIS) 5 MG TABS tablet Take 1 tablet (5 mg total) by mouth 2 (two) times daily. 60 tablet 5  . cetirizine (ZYRTEC) 10 MG tablet Take 1 tablet (10 mg total) by mouth daily. 30 tablet 1  . docusate sodium (COLACE) 100 MG capsule Take 1 capsule (100 mg total) by mouth every 12 (twelve) hours. 60 capsule 0  . hydrocortisone cream 0.5 % Apply 1 application topically 2 (two) times  daily. 30 g 1  . lisinopril (ZESTRIL) 20 MG tablet Take 1 tablet (20 mg total) by mouth daily. 90 tablet 3  . polyethylene glycol (MIRALAX / GLYCOLAX) 17 g packet Take 17 g by mouth 2 (two) times daily. Please stop if you have diarrhea. 60 each 0  . tamsulosin (FLOMAX) 0.4 MG CAPS capsule Take 1 capsule (0.4 mg total) by mouth daily. 90 capsule 1  . triamcinolone cream (KENALOG) 0.1 % Apply 1 application topically 2 (two) times daily. 30 g 1   No current facility-administered medications for this visit.    No Known Allergies  Social History   Socioeconomic History  . Marital status: Single    Spouse name: Not on file  . Number of children: Not on file  . Years of education: Not on file  . Highest education level: Not on file  Occupational History  . Not on file  Tobacco Use  . Smoking status: Never Smoker  . Smokeless tobacco: Never Used  Substance and Sexual Activity  . Alcohol use: Yes  . Drug use: No  . Sexual activity: Not on file  Other Topics Concern  . Not on file  Social History Narrative  . Not on file   Social Determinants of Health   Financial Resource Strain:   . Difficulty of Paying Living Expenses: Not on file  Food Insecurity:   . Worried About Programme researcher, broadcasting/film/video in the Last Year: Not on file  .  Ran Out of Food in the Last Year: Not on file  Transportation Needs:   . Lack of Transportation (Medical): Not on file  . Lack of Transportation (Non-Medical): Not on file  Physical Activity:   . Days of Exercise per Week: Not on file  . Minutes of Exercise per Session: Not on file  Stress:   . Feeling of Stress : Not on file  Social Connections:   . Frequency of Communication with Friends and Family: Not on file  . Frequency of Social Gatherings with Friends and Family: Not on file  . Attends Religious Services: Not on file  . Active Member of Clubs or Organizations: Not on file  . Attends Archivist Meetings: Not on file  . Marital Status: Not  on file  Intimate Partner Violence:   . Fear of Current or Ex-Partner: Not on file  . Emotionally Abused: Not on file  . Physically Abused: Not on file  . Sexually Abused: Not on file     Review of Systems: General: No chills, fever, night sweats or weight changes  Cardiovascular:  No chest pain, dyspnea on exertion, edema, orthopnea, palpitations, paroxysmal nocturnal dyspnea Dermatological: No rash, lesions or masses Respiratory: No cough, dyspnea Urologic: No hematuria, dysuria Abdominal: No nausea, vomiting, diarrhea, bright red blood per rectum, melena, or hematemesis Neurologic: No visual changes, weakness, changes in mental status All other systems reviewed and are otherwise negative except as noted above.  Physical Exam: Vitals:   03/25/19 1206  BP: 138/68  Pulse: 69  SpO2: 97%  Weight: 172 lb (78 kg)  Height: 5\' 5"  (1.651 m)    GEN- The patient is well appearing, alert and oriented x 3 today.   HEENT: normocephalic, atraumatic; sclera clear, conjunctiva pink; hearing intact; oropharynx clear; neck supple, no JVP Lymph- no cervical lymphadenopathy Lungs- Clear to ausculation bilaterally, normal work of breathing.  No wheezes, rales, rhonchi Heart- Regular rate and rhythm, no murmurs, rubs or gallops, PMI not laterally displaced GI- soft, non-tender, non-distended, bowel sounds present, no hepatosplenomegaly Extremities- no clubbing, cyanosis, or edema; DP/PT/radial pulses 2+ bilaterally MS- no significant deformity or atrophy Skin- warm and dry, no rash or lesion Psych- euthymic mood, full affect Neuro- strength and sensation are intact  EKG is ordered. Personal review of EKG from today shows NSR 68 bpm with PR interval at 204 ms and QRS 96 ms. PVC noted  Assessment and Plan:  1. VT Pt shown to have RVOT VT (benign focal VT) in the setting of acute PTE with preserved EF No indication for ICD or further EP interventions Stable at this time off of amiodarone  Echo 02/09/2019 with LVEF 60-65  2. Paroxysmal atrial fibrillation Maintaining NSR Taken off BB with sinus brady. Rates well controlled Continue Eliquis for CHA2DS2VASC of 3    3. HTN Continue lisonopril 20 mg daily  4. PTE On Eliquis. Again encouraged compliance.   Doing well overall. No complaints. Recent labwork stable. Knows to go to ED with any syncope. RTC 6 months, sooner with symptoms.   Shirley Friar, PA-C  03/25/19 12:57 PM

## 2019-03-25 NOTE — Telephone Encounter (Signed)
Will route to PCP for review.  Patient is needing a return to work note for recent [facility] paperwork that was filled out.

## 2019-03-25 NOTE — Telephone Encounter (Signed)
I spoke to the patient and mentioned that we could not officially write a "return to work" note, because we never officially wrote him "out of work", according to [employer], [place]. He will reach out to PCP.

## 2019-03-25 NOTE — Patient Instructions (Signed)
Medication Instructions:  none *If you need a refill on your cardiac medications before your next appointment, please call your pharmacy*  Lab Work: none If you have labs (blood work) drawn today and your tests are completely normal, you will receive your results only by: . MyChart Message (if you have MyChart) OR . A paper copy in the mail If you have any lab test that is abnormal or we need to change your treatment, we will call you to review the results.  Testing/Procedures: none  Follow-Up: At [facility], you and your health needs are our priority.  As part of our continuing mission to provide you with exceptional heart care, we have created designated Provider Care Teams.  These Care Teams include your primary Cardiologist (physician) and Advanced Practice Providers (APPs -  Physician Assistants and Nurse Practitioners) who all work together to provide you with the care you need, when you need it.  Your next appointment:   6 month(s)  The format for your next appointment:   In Person  Provider:   Andy Tillery, PA  Other Instructions   

## 2019-03-25 NOTE — Telephone Encounter (Signed)
Patient came to office  requesting a letter to go back to work . Please, call him back  Thanks

## 2019-03-26 NOTE — Telephone Encounter (Signed)
Patient came into clinic and stated that he feels much better and would like to return to work.

## 2019-03-26 NOTE — Telephone Encounter (Signed)
Done

## 2019-03-26 NOTE — Telephone Encounter (Signed)
FMLA paperwork completed indicated request to be out of work till 04/2019.  Do we have a copy of the form so we can inform him accordingly as there is a discrepancy here?  Thank you

## 2019-04-05 ENCOUNTER — Ambulatory Visit: Payer: 59 | Admitting: Student

## 2019-04-28 ENCOUNTER — Other Ambulatory Visit: Payer: Self-pay

## 2019-04-28 ENCOUNTER — Ambulatory Visit: Payer: 59 | Attending: Family Medicine | Admitting: Family Medicine

## 2019-04-28 ENCOUNTER — Encounter: Payer: Self-pay | Admitting: Family Medicine

## 2019-04-28 VITALS — BP 174/62 | HR 62 | Ht 65.0 in | Wt 173.0 lb

## 2019-04-28 DIAGNOSIS — N3943 Post-void dribbling: Secondary | ICD-10-CM | POA: Diagnosis not present

## 2019-04-28 DIAGNOSIS — N401 Enlarged prostate with lower urinary tract symptoms: Secondary | ICD-10-CM

## 2019-04-28 DIAGNOSIS — I480 Paroxysmal atrial fibrillation: Secondary | ICD-10-CM | POA: Diagnosis not present

## 2019-04-28 DIAGNOSIS — I2609 Other pulmonary embolism with acute cor pulmonale: Secondary | ICD-10-CM

## 2019-04-28 DIAGNOSIS — I10 Essential (primary) hypertension: Secondary | ICD-10-CM | POA: Diagnosis not present

## 2019-04-28 MED ORDER — AMLODIPINE BESYLATE 10 MG PO TABS: 10.0000 mg | ORAL_TABLET | Freq: Every day | ORAL | 1 refills | Status: DC

## 2019-04-28 NOTE — Progress Notes (Signed)
Subjective:  Patient ID: Jesus Morrison, male    DOB: 05-04-1926  Age: 86 y.o. MRN: [id number]  CC: Hypertension   HPI Jesus Morrison is a86 year old male with a history of hypertension, history of BPH status post retropubic prostatectomy in 04/2012, pulmonary embolism diagnosed in 02/2019, paroxysmal A. fib, V. tach in the setting of acute pulmonary thromboembolism who presents today for a follow-up visit.  He complains of post micturition dribbling and endorses compliance with his Flomax.  Denies presence of dysuria, hematuria. Did not take antihypertensive so he wouldn't be late and his blood pressure is severely elevated at 210/72; he has no headaches, blurry vision.  He does have his antihypertensives with him in his pocket and we have provided him some water and advised to take his medications in the clinic.  At Cardiology visit BP was 138/63 last month.  Cardiology notes reviewed from 03/25/2019 and no indication for EP intervention SVT was thought to be in the setting of acute pulmonary embolism. He is doing well on Eliquis and denies presence of hematochezia but does have some slight bleeding from his gums on brushing.  He has returned to work and denies presence of dyspnea, chest pain or pedal edema. Past Medical History:  Diagnosis Date  . Arthritis    fingers and hands  . BPH (benign prostatic hypertrophy)    problems with acute urinary retention- has indwelling foley catheter  . Cold (disease)    last week ( week of Dec 22- Dec 29 ) - no longer coughing, no fever and feels better now  . Enlarged prostate   . Hypertension     Past Surgical History:  Procedure Laterality Date  .  Right elbow surgery as a child    . PROSTATECTOMY  04/17/2012   Procedure: PROSTATECTOMY RETROPUBIC;  Surgeon: Hanley Ben, MD;  Location: [facility];  Service: Urology;  Laterality: N/A;  Simple Retropubic Prostatectomy      Family History  Problem Relation Age of Onset  . Liver disease Father    . Heart disease Brother     No Known Allergies  Outpatient Medications Prior to Visit  Medication Sig Dispense Refill  . amLODipine (NORVASC) 10 MG tablet Take 1 tablet (10 mg total) by mouth daily. 90 tablet 1  . apixaban (ELIQUIS) 5 MG TABS tablet Take 1 tablet (5 mg total) by mouth 2 (two) times daily. 60 tablet 5  . cetirizine (ZYRTEC) 10 MG tablet Take 1 tablet (10 mg total) by mouth daily. 30 tablet 1  . docusate sodium (COLACE) 100 MG capsule Take 1 capsule (100 mg total) by mouth every 12 (twelve) hours. 60 capsule 0  . hydrocortisone cream 0.5 % Apply 1 application topically 2 (two) times daily. 30 g 1  . lisinopril (ZESTRIL) 20 MG tablet Take 1 tablet (20 mg total) by mouth daily. 90 tablet 3  . polyethylene glycol (MIRALAX / GLYCOLAX) 17 g packet Take 17 g by mouth 2 (two) times daily. Please stop if you have diarrhea. 60 each 0  . tamsulosin (FLOMAX) 0.4 MG CAPS capsule Take 1 capsule (0.4 mg total) by mouth daily. 90 capsule 1  . triamcinolone cream (KENALOG) 0.1 % Apply 1 application topically 2 (two) times daily. 30 g 1   No facility-administered medications prior to visit.     ROS Review of Systems  Constitutional: Negative for activity change and appetite change.  HENT: Negative for sinus pressure and sore throat.   Eyes: Negative for visual disturbance.  Respiratory:  Negative for cough, chest tightness and shortness of breath.   Cardiovascular: Negative for chest pain and leg swelling.  Gastrointestinal: Negative for abdominal distention, abdominal pain, constipation and diarrhea.  Endocrine: Negative.   Genitourinary: Negative for dysuria and hematuria.  Musculoskeletal: Negative for joint swelling and myalgias.  Skin: Negative for rash.  Allergic/Immunologic: Negative.   Neurological: Negative for weakness, light-headedness and numbness.  Psychiatric/Behavioral: Negative for dysphoric mood and suicidal ideas.    Objective:  BP (!) 210/72   Pulse 62   Ht  5\' 5"  (1.651 m)   Wt 173 lb (78.5 kg)   SpO2 100%   BMI 28.79 kg/m   BP/Weight 04/28/2019 03/25/2019 03/08/2019  Systolic BP 210 138 173  Diastolic BP 72 68 62  Wt. (Lbs) 173 172 160  BMI 28.79 28.62 26.63      Physical Exam Constitutional:      Appearance: He is well-developed.  Neck:     Vascular: No JVD.  Cardiovascular:     Rate and Rhythm: Normal rate.     Heart sounds: No murmur. Gallop present. S3 sounds present.   Pulmonary:     Effort: Pulmonary effort is normal.     Breath sounds: Normal breath sounds. No wheezing or rales.  Chest:     Chest wall: No tenderness.  Abdominal:     General: Bowel sounds are normal. There is no distension.     Palpations: Abdomen is soft. There is no mass.     Tenderness: There is no abdominal tenderness.  Musculoskeletal:        General: Normal range of motion.     Right lower leg: No edema.     Left lower leg: No edema.  Neurological:     Mental Status: He is alert and oriented to person, place, and time.  Psychiatric:        Mood and Affect: Mood normal.     CMP Latest Ref Rng & Units 03/08/2019 03/05/2019 03/01/2019  Glucose 70 - 99 mg/dL 03/03/2019) 646(O) 86  BUN 8 - 23 mg/dL 11 19 15   Creatinine 0.61 - 1.24 mg/dL 032(Z ) 2.24  Sodium 135 - 145 mmol/L 140 141 143  Potassium 3.5 - 5.1 mmol/L 3.7 4.3 4.1  Chloride 98 - 111 mmol/L 105 105 104  CO2 22 - 32 mmol/L 25 25 22   Calcium 8.9 - 10.3 mg/dL 9.0 8.9 9.1  Total Protein 6.5 - 8.1 g/dL 7.0 6.6 -  Total Bilirubin 0.3 - 1.2 mg/dL 8.25(O) 1.1 -  Alkaline Phos 38 - 126 U/L 47 46 -  AST 15 - 41 U/L 13(L) 16 -  ALT 0 - 44 U/L 10 11 -    Lipid Panel     Component Value Date/Time   CHOL 165 11/23/2018 0949   TRIG 39 02/09/2019 1009   HDL 58 11/23/2018 0949   CHOLHDL 2.8 11/23/2018 0949   CHOLHDL 2.6 10/27/2015 0943   VLDL 10 10/27/2015 0943   LDLCALC 98 11/23/2018 0949    CBC    Component Value Date/Time   WBC 7.9 03/08/2019 0537   RBC 4.71 03/08/2019 0537    HGB 13.6 03/08/2019 0537   HCT 42.6 03/08/2019 0537   PLT 222 03/08/2019 0537   MCV 90.4 03/08/2019 0537   MCH 28.9 03/08/2019 0537   MCHC 31.9 03/08/2019 0537   RDW 13.5 03/08/2019 0537   LYMPHSABS 2.6 03/08/2019 0537   MONOABS 0.9 03/08/2019 0537   EOSABS 0.2 03/08/2019 0537   BASOSABS 0.1  03/08/2019 0537    No results found for: HGBA1C        Assessment & Plan:   1. Accelerated hypertension Accelerated hypertension with high risk of endorgan affectation due to not taking medications this morning We have had him take lisinopril, amlodipine in the clinic and observed him for 30 minutes. Repeat blood pressure performed returned at 174/62 Counseled on blood pressure goal of less than 130/80, low-sodium, DASH diet, medication compliance, 150 minutes of moderate intensity exercise per week. Discussed medication compliance, adverse effects. - Lipid panel - Basic Metabolic Panel - amLODipine (NORVASC) 10 MG tablet; Take 1 tablet (10 mg total) by mouth daily.  Dispense: 90 tablet; Refill: 1  2. Paroxysmal atrial fibrillation (HCC) Currently on anticoagulation with Eliquis which she is tolerating Amiodarone discontinued by cardiology due to bradycardia  3. Benign prostatic hyperplasia with post-void dribbling Uncontrolled with post micturition dribbling despite use of Flomax Referred to urology given previous prostatectomy status - Ambulatory referral to Urology  4. Other pulmonary embolism with acute cor pulmonale, unspecified chronicity (HCC) He will remain on chronic anticoagulation with Eliquis     Hoy Register, MD, FAAFP. [facility] [place], [place] [phone]   04/28/2019, 9:02 AM

## 2019-04-29 ENCOUNTER — Telehealth: Payer: Self-pay

## 2019-04-29 LAB — BASIC METABOLIC PANEL
BUN/Creatinine Ratio: 18 (ref 10–24)
BUN: 17 mg/dL (ref 10–36)
CO2: 24 mmol/L (ref 20–29)
Calcium: 9.1 mg/dL (ref 8.6–10.2)
Chloride: 105 mmol/L (ref 96–106)
Creatinine, Ser: 0.92 mg/dL (ref 0.76–1.27)
GFR calc Af Amer: 84 mL/min/{1.73_m2} (ref >59)
GFR calc non Af Amer: 73 mL/min/{1.73_m2} (ref >59)
Glucose: 88 mg/dL (ref 65–99)
Potassium: 4.5 mmol/L (ref 3.5–5.2)
Sodium: 145 mmol/L — ABNORMAL HIGH (ref 134–144)

## 2019-04-29 LAB — LIPID PANEL
Chol/HDL Ratio: 3 ratio (ref 0.0–5.0)
Cholesterol, Total: 186 mg/dL (ref 100–199)
HDL: 63 mg/dL (ref >39)
LDL Chol Calc (NIH): 114 mg/dL — ABNORMAL HIGH (ref 0–99)
Triglycerides: 46 mg/dL (ref 0–149)
VLDL Cholesterol Cal: 9 mg/dL (ref 5–40)

## 2019-04-29 NOTE — Telephone Encounter (Signed)
Patient was called and a voicemail was left informing patient to return phone call for lab results. 

## 2019-04-29 NOTE — Telephone Encounter (Signed)
-----   Message from Hoy Register, MD sent at 04/29/2019 10:39 AM EST ----- His kidney and liver functions are normal. LDL (Bad cholesterol) is slightly elevated and low cholesterol diet will be beneficial.

## 2019-05-26 ENCOUNTER — Ambulatory Visit: Payer: 59 | Attending: Internal Medicine

## 2019-05-26 ENCOUNTER — Ambulatory Visit: Payer: 59 | Admitting: Family Medicine

## 2019-05-26 DIAGNOSIS — Z23 Encounter for immunization: Secondary | ICD-10-CM | POA: Insufficient documentation

## 2019-05-26 NOTE — Progress Notes (Signed)
   Covid-19 Vaccination Clinic  Name:  Shaunte Weissinger    MRN: [id number] DOB: 06-19-1926  05/26/2019  Mr. Boardman was observed post Covid-19 immunization for 15 minutes without incidence. He was provided with Vaccine Information Sheet and instruction to access the V-Safe system.   Mr. Scheel was instructed to call 911 with any severe reactions post vaccine: Marland Kitchen Difficulty breathing  . Swelling of your face and throat  . A fast heartbeat  . A bad rash all over your body  . Dizziness and weakness    Immunizations Administered    Name Date Dose VIS Date Route   Pfizer COVID-19 Vaccine 05/26/2019  3:17 PM 0.3 mL 03/26/2019 Intramuscular   Manufacturer: [employer], [employer]   Lot: [id number]   NDC: [address]-0

## 2019-06-09 ENCOUNTER — Encounter: Payer: Self-pay | Admitting: Family Medicine

## 2019-06-09 ENCOUNTER — Other Ambulatory Visit: Payer: Self-pay

## 2019-06-09 ENCOUNTER — Ambulatory Visit: Payer: 59 | Attending: Family Medicine | Admitting: Family Medicine

## 2019-06-09 VITALS — BP 152/66 | HR 69 | Temp 98.6°F | Ht 65.0 in | Wt 175.6 lb

## 2019-06-09 DIAGNOSIS — N3943 Post-void dribbling: Secondary | ICD-10-CM | POA: Diagnosis not present

## 2019-06-09 DIAGNOSIS — I10 Essential (primary) hypertension: Secondary | ICD-10-CM

## 2019-06-09 DIAGNOSIS — N401 Enlarged prostate with lower urinary tract symptoms: Secondary | ICD-10-CM

## 2019-06-09 MED ORDER — POLYETHYLENE GLYCOL 3350 17 G PO PACK: 17.0000 g | PACK | Freq: Every day | ORAL | 3 refills | Status: AC

## 2019-06-09 MED ORDER — CARVEDILOL 3.125 MG PO TABS: 3.1250 mg | ORAL_TABLET | Freq: Two times a day (BID) | ORAL | 3 refills | Status: DC

## 2019-06-09 NOTE — Progress Notes (Signed)
Subjective:  Patient ID: Jesus Morrison, male    DOB: 10/12/1926  Age: 86 y.o. MRN: [id number]  CC: Hypertension   HPI Cliffard Hair with a history of hypertension, history of BPH status post retropubic prostatectomy in 04/2012, pulmonary embolism diagnosed in 02/2019, paroxysmal A. fib, V. tach in the setting of acute pulmonary thromboembolism who presents today for a follow-up visit.  He complains of post micturition dribbling, urgency. He has nocturia and wakes up 3 times at night and is currently on Flomax with no improvement in symptoms.  I had referred him to urology at his last visit but he states he is yet to hear from them.  Denies presence of abdominal pain, dysuria, hematuria. He did have an accelerated blood pressure at his last office visit and was yet to take his antihypertensive from the time.  Blood pressure has improved today but is still elevated.   Past Medical History:  Diagnosis Date  . Arthritis    fingers and hands  . BPH (benign prostatic hypertrophy)    problems with acute urinary retention- has indwelling foley catheter  . Cold (disease)    last week ( week of Dec 22- Dec 29 ) - no longer coughing, no fever and feels better now  . Enlarged prostate   . Hypertension     Past Surgical History:  Procedure Laterality Date  .  Right elbow surgery as a child    . PROSTATECTOMY  04/17/2012   Procedure: PROSTATECTOMY RETROPUBIC;  Surgeon: Hanley Ben, MD;  Location: [facility];  Service: Urology;  Laterality: N/A;  Simple Retropubic Prostatectomy      Family History  Problem Relation Age of Onset  . Liver disease Father   . Heart disease Brother     No Known Allergies  Outpatient Medications Prior to Visit  Medication Sig Dispense Refill  . amLODipine (NORVASC) 10 MG tablet Take 1 tablet (10 mg total) by mouth daily. 90 tablet 1  . apixaban (ELIQUIS) 5 MG TABS tablet Take 1 tablet (5 mg total) by mouth 2 (two) times daily. 60 tablet 5  . cetirizine (ZYRTEC)  10 MG tablet Take 1 tablet (10 mg total) by mouth daily. 30 tablet 1  . docusate sodium (COLACE) 100 MG capsule Take 1 capsule (100 mg total) by mouth every 12 (twelve) hours. 60 capsule 0  . hydrocortisone cream 0.5 % Apply 1 application topically 2 (two) times daily. 30 g 1  . polyethylene glycol (MIRALAX / GLYCOLAX) 17 g packet Take 17 g by mouth 2 (two) times daily. Please stop if you have diarrhea. 60 each 0  . tamsulosin (FLOMAX) 0.4 MG CAPS capsule Take 1 capsule (0.4 mg total) by mouth daily. 90 capsule 1  . triamcinolone cream (KENALOG) 0.1 % Apply 1 application topically 2 (two) times daily. 30 g 1  . lisinopril (ZESTRIL) 20 MG tablet Take 1 tablet (20 mg total) by mouth daily. 90 tablet 3   No facility-administered medications prior to visit.     ROS Review of Systems  Constitutional: Negative for activity change and appetite change.  HENT: Negative for sinus pressure and sore throat.   Eyes: Negative for visual disturbance.  Respiratory: Negative for cough, chest tightness and shortness of breath.   Cardiovascular: Negative for chest pain and leg swelling.  Gastrointestinal: Negative for abdominal distention, abdominal pain, constipation and diarrhea.  Endocrine: Negative.   Genitourinary: Negative for dysuria.  Musculoskeletal: Negative for joint swelling and myalgias.  Skin: Negative for rash.  Allergic/Immunologic: Negative.   Neurological: Negative for weakness, light-headedness and numbness.  Psychiatric/Behavioral: Negative for dysphoric mood and suicidal ideas.    Objective:  BP (!) 152/66   Pulse 69   Temp 98.6 F (37 C) (Temporal)   Ht 5\' 5"  (1.651 m)   Wt 175 lb 9.6 oz (79.7 kg)   SpO2 99%   BMI 29.22 kg/m   BP/Weight 06/09/2019 04/28/2019 03/25/2019  Systolic BP 152 174 138  Diastolic BP 66 62 68  Wt. (Lbs) 175.6 173 172  BMI 29.22 28.79 28.62      Physical Exam Constitutional:      Appearance: He is well-developed.  Neck:     Vascular: No  JVD.  Cardiovascular:     Rate and Rhythm: Normal rate.     Heart sounds: Normal heart sounds. No murmur.  Pulmonary:     Effort: Pulmonary effort is normal.     Breath sounds: Normal breath sounds. No wheezing or rales.  Chest:     Chest wall: No tenderness.  Abdominal:     General: Bowel sounds are normal. There is no distension.     Palpations: Abdomen is soft. There is no mass.     Tenderness: There is no abdominal tenderness.  Musculoskeletal:        General: Normal range of motion.     Right lower leg: No edema.     Left lower leg: No edema.  Neurological:     Mental Status: He is alert and oriented to person, place, and time.  Psychiatric:        Mood and Affect: Mood normal.     CMP Latest Ref Rng & Units 04/28/2019 03/08/2019 03/05/2019  Glucose 65 - 99 mg/dL 88 03/07/2019) 403(K)  BUN 10 - 36 mg/dL 17 11 19   Creatinine 0.76 - 1.27 mg/dL 742(V 9.56)  Sodium 134 - 144 mmol/L 145(H) 140 141  Potassium 3.5 - 5.2 mmol/L 4.5 3.7 4.3  Chloride 96 - 106 mmol/L 105 105 105  CO2 20 - 29 mmol/L 24 25 25   Calcium 8.6 - 10.2 mg/dL 9.1 9.0 8.9  Total Protein 6.5 - 8.1 g/dL - 7.0 6.6  Total Bilirubin 0.3 - 1.2 mg/dL - 3.87) 1.1  Alkaline Phos 38 - 126 U/L - 47 46  AST 15 - 41 U/L - 13(L) 16  ALT 0 - 44 U/L - 10 11    Lipid Panel     Component Value Date/Time   CHOL 186 04/28/2019 0941   TRIG 46 04/28/2019 0941   HDL 63 04/28/2019 0941   CHOLHDL 3.0 04/28/2019 0941   CHOLHDL 2.6 10/27/2015 0943   VLDL 10 10/27/2015 0943   LDLCALC 114 (H) 04/28/2019 0941    CBC    Component Value Date/Time   WBC 7.9 03/08/2019 0537   RBC 4.71 03/08/2019 0537   HGB 13.6 03/08/2019 0537   HCT 42.6 03/08/2019 0537   PLT 222 03/08/2019 0537   MCV 90.4 03/08/2019 0537   MCH 28.9 03/08/2019 0537   MCHC 31.9 03/08/2019 0537   RDW 13.5 03/08/2019 0537   LYMPHSABS 2.6 03/08/2019 0537   MONOABS 0.9 03/08/2019 0537   EOSABS 0.2 03/08/2019 0537   BASOSABS 0.1 03/08/2019 0537    No  results found for: HGBA1C  Assessment & Plan:   1. Essential hypertension Uncontrolled Carvedilol added to regimen Continue amlodipine and lisinopril Counseled on blood pressure goal of less than 130/80, low-sodium, DASH diet, medication compliance, 150 minutes of moderate intensity  exercise per week. Discussed medication compliance, adverse effects. - carvedilol (COREG) 3.125 MG tablet; Take 1 tablet (3.125 mg total) by mouth 2 (two) times daily with a meal.  Dispense: 60 tablet; Refill: 3  2. Benign prostatic hyperplasia with post-void dribbling Uncontrolled We have called urology and obtained an appointment for him on 06/17/2019.  Return in about 3 months (around 09/06/2019) for chronic medical conditions.       Hoy Register, MD, FAAFP. [facility] [place], [place] [phone]   06/09/2019, 2:18 PM

## 2019-06-09 NOTE — Patient Instructions (Addendum)
Appointment with [facility] [address] [place] 2nd floor [phone]  March 4,2021 at 9:45 am

## 2019-06-09 NOTE — Progress Notes (Signed)
Patient is having frequent urination.

## 2019-06-11 ENCOUNTER — Telehealth: Payer: Self-pay | Admitting: Student

## 2019-06-11 DIAGNOSIS — R001 Bradycardia, unspecified: Secondary | ICD-10-CM

## 2019-06-11 NOTE — Telephone Encounter (Signed)
I will send call to primary card nurse 

## 2019-06-11 NOTE — Telephone Encounter (Signed)
I would like to get a 24 Hour holter on low dose carvedilol to make sure HR does not get too low

## 2019-06-11 NOTE — Telephone Encounter (Signed)
Patient's daughter states that she is concerned about her dad being on too many medications for his blood pressure. At his visit with Dr. Alvis Lemmings on 02/24 and was started on carvedilol 3.125 mg twice per day. Patient also takes amlodipine and lisinopril. His blood pressure in the office was 152/66. Patient's daughter would like to confirm with cardiologist if the patient should be taking carvedilol or not.

## 2019-06-11 NOTE — Telephone Encounter (Signed)
New Message   Pt's daughter called and stated that Dr. Alvis Lemmings put him on the medication carvedilol (COREG) 3.125 MG tablet. Wanted to know if Maxine Glenn will look to see if he approves of him taking the medication along with the other blood pressure medication that he is taking.   Please call to discuss

## 2019-06-11 NOTE — Telephone Encounter (Signed)
Yes he should take it given that his BP is too high

## 2019-06-11 NOTE — Telephone Encounter (Signed)
Left message to call back  

## 2019-06-11 NOTE — Telephone Encounter (Signed)
Advised patient's daughter that patient should be taking carvedilol as his BP has been running too high. She verbalized understanding. Will also order 24 hour holter monitor.

## 2019-06-12 NOTE — Telephone Encounter (Signed)
Noted. Agree that he should be taking.  Thank you

## 2019-06-14 ENCOUNTER — Telehealth: Payer: Self-pay | Admitting: *Deleted

## 2019-06-14 NOTE — Telephone Encounter (Signed)
Patient enrolled for [employer] to mail a 3 day ZIO XT [employer] term holter monitor to his home. 

## 2019-06-17 DIAGNOSIS — N401 Enlarged prostate with lower urinary tract symptoms: Secondary | ICD-10-CM | POA: Diagnosis not present

## 2019-06-17 DIAGNOSIS — R350 Frequency of micturition: Secondary | ICD-10-CM | POA: Diagnosis not present

## 2019-06-19 ENCOUNTER — Ambulatory Visit: Payer: 59 | Attending: Internal Medicine

## 2019-06-19 DIAGNOSIS — Z23 Encounter for immunization: Secondary | ICD-10-CM | POA: Insufficient documentation

## 2019-06-19 NOTE — Progress Notes (Signed)
   Covid-19 Vaccination Clinic  Name:  Duran Ohern    MRN: [id number] DOB: 08/02/1926  06/19/2019  Mr. Magwood was observed post Covid-19 immunization for 15 minutes without incident. He was provided with Vaccine Information Sheet and instruction to access the V-Safe system.   Mr. Dileonardo was instructed to call 911 with any severe reactions post vaccine: Marland Kitchen Difficulty breathing  . Swelling of face and throat  . A fast heartbeat  . A bad rash all over body  . Dizziness and weakness   Immunizations Administered    Name Date Dose VIS Date Route   Pfizer COVID-19 Vaccine 06/19/2019 11:22 AM 0.3 mL 03/26/2019 Intramuscular   Manufacturer: [employer], [employer]   Lot: [id number]   NDC: [address]-0

## 2019-06-25 ENCOUNTER — Ambulatory Visit (INDEPENDENT_AMBULATORY_CARE_PROVIDER_SITE_OTHER): Payer: 59

## 2019-06-25 DIAGNOSIS — R001 Bradycardia, unspecified: Secondary | ICD-10-CM

## 2019-07-27 ENCOUNTER — Telehealth: Payer: Self-pay

## 2019-07-27 DIAGNOSIS — I493 Ventricular premature depolarization: Secondary | ICD-10-CM

## 2019-07-27 DIAGNOSIS — I472 Ventricular tachycardia, unspecified: Secondary | ICD-10-CM

## 2019-07-27 NOTE — Telephone Encounter (Signed)
Left message for patient to call back x3. Will put in referral for EP and send patient a letter.

## 2019-07-27 NOTE — Telephone Encounter (Signed)
-----   Message from Quintella Reichert, MD sent at 07/22/2019  3:24 PM EDT ----- Still having significant ventricular ectopy - please refer to EP - Dr. Johney Frame to folowup for further input on treatment

## 2019-08-04 ENCOUNTER — Telehealth: Payer: Self-pay

## 2019-08-04 DIAGNOSIS — I493 Ventricular premature depolarization: Secondary | ICD-10-CM

## 2019-08-04 NOTE — Telephone Encounter (Signed)
The patient received his letter to call and review monitor results, but his phone has been out so he came to the office to get his results.   Per Dr. Felicita Gage, MD  07/22/2019 3:24 PM EDT    Still having significant ventricular ectopy - please refer to EP - Dr. Johney Frame to folowup for further input on treatment    Reviewed results with patient who verbalized understanding.   Scheduled the patient with Dr. Ladona Ridgel 5/12. He was grateful for assistance.

## 2019-08-25 ENCOUNTER — Institutional Professional Consult (permissible substitution): Payer: 59 | Admitting: Internal Medicine

## 2019-08-25 ENCOUNTER — Ambulatory Visit: Payer: 59 | Admitting: Internal Medicine

## 2019-08-30 ENCOUNTER — Ambulatory Visit: Payer: 59 | Admitting: Family Medicine

## 2019-09-01 ENCOUNTER — Other Ambulatory Visit: Payer: Self-pay | Admitting: Student

## 2019-09-01 DIAGNOSIS — I480 Paroxysmal atrial fibrillation: Secondary | ICD-10-CM

## 2019-09-01 NOTE — Telephone Encounter (Signed)
Age 86, weight 79.7kg, SCr 0.92 on 04/28/19, afib indication, last OV Dec 2020

## 2019-09-02 ENCOUNTER — Ambulatory Visit: Payer: 59 | Admitting: Family Medicine

## 2019-09-15 ENCOUNTER — Ambulatory Visit: Payer: 59 | Admitting: Family Medicine

## 2019-09-19 ENCOUNTER — Other Ambulatory Visit: Payer: Self-pay | Admitting: Family Medicine

## 2019-09-19 DIAGNOSIS — N401 Enlarged prostate with lower urinary tract symptoms: Secondary | ICD-10-CM

## 2019-09-30 ENCOUNTER — Other Ambulatory Visit: Payer: Self-pay | Admitting: Family Medicine

## 2019-09-30 DIAGNOSIS — I10 Essential (primary) hypertension: Secondary | ICD-10-CM

## 2019-10-12 ENCOUNTER — Ambulatory Visit: Payer: 59 | Attending: Family Medicine | Admitting: Family Medicine

## 2019-10-12 ENCOUNTER — Other Ambulatory Visit: Payer: Self-pay

## 2019-10-12 ENCOUNTER — Encounter: Payer: Self-pay | Admitting: Family Medicine

## 2019-10-12 ENCOUNTER — Telehealth: Payer: Self-pay | Admitting: Internal Medicine

## 2019-10-12 VITALS — BP 173/65 | HR 42 | Ht 65.0 in | Wt 171.0 lb

## 2019-10-12 DIAGNOSIS — I10 Essential (primary) hypertension: Secondary | ICD-10-CM

## 2019-10-12 DIAGNOSIS — R001 Bradycardia, unspecified: Secondary | ICD-10-CM | POA: Diagnosis not present

## 2019-10-12 DIAGNOSIS — N401 Enlarged prostate with lower urinary tract symptoms: Secondary | ICD-10-CM

## 2019-10-12 DIAGNOSIS — I480 Paroxysmal atrial fibrillation: Secondary | ICD-10-CM | POA: Diagnosis not present

## 2019-10-12 DIAGNOSIS — R3911 Hesitancy of micturition: Secondary | ICD-10-CM

## 2019-10-12 MED ORDER — TAMSULOSIN HCL 0.4 MG PO CAPS: ORAL_CAPSULE | ORAL | 1 refills | Status: DC

## 2019-10-12 MED ORDER — CARVEDILOL 3.125 MG PO TABS: ORAL_TABLET | ORAL | 1 refills | Status: DC

## 2019-10-12 MED ORDER — AMLODIPINE BESYLATE 10 MG PO TABS: 10.0000 mg | ORAL_TABLET | Freq: Every day | ORAL | 1 refills | Status: DC

## 2019-10-12 NOTE — Patient Instructions (Addendum)
[facility] ph. # [phone].Address [address] [place] 2nd floor.   Thursday October 14, 2019 at 3:30

## 2019-10-12 NOTE — Progress Notes (Signed)
Subjective:  Patient ID: Jesus Morrison, male    DOB: [birth year]  Age: 86 y.o. MRN: [id number]  CC: Hypertension   HPI Jesus Morrison is a 86 year old male with a history of hypertension, history of BPH status post retropubic prostatectomy in 04/2012, pulmonary embolism diagnosed in 02/2019, paroxysmal A. fib, V. tach in the setting of acute pulmonary thromboembolismwho presents today for a follow-up visit.  His BP is elevated and he is yet to take his medications. HR is bradycardic at 42 and he has no dizziness, chest pain or dyspnea. He is currently on Coreg for rate control of A. Fib and Hypertension.  Last note from cardiology indicates he was referred to Dr. Ladona Ridgel for evaluation of ventricular ectopy but it looks like he missed his appointment scheduled for 08/25/2019.   Complains of post micturition dribbling and I had referred him to [facility]rology with an appointment scheduled for 07/2019 but he does not seem to be aware that he had an appointment.  He remains on Flomax. He has no additional concerns today.  Past Medical History:  Diagnosis Date  . Arthritis    fingers and hands  . BPH (benign prostatic hypertrophy)    problems with acute urinary retention- has indwelling foley catheter  . Cold (disease)    last week ( week of Dec 22- Dec 29 ) - no longer coughing, no fever and feels better now  . Enlarged prostate   . Hypertension     Past Surgical History:  Procedure Laterality Date  .  Right elbow surgery as a child    . PROSTATECTOMY  04/17/2012   Procedure: PROSTATECTOMY RETROPUBIC;  Surgeon: Lindaann Slough, MD;  Location: [facility];  Service: Urology;  Laterality: N/A;  Simple Retropubic Prostatectomy      Family History  Problem Relation Age of Onset  . Liver disease Father   . Heart disease Brother     No Known Allergies  Outpatient Medications Prior to Visit  Medication Sig Dispense Refill  . amLODipine (NORVASC) 10 MG tablet Take 1 tablet (10 mg total) by  mouth daily. 90 tablet 1  . carvedilol (COREG) 3.125 MG tablet TAKE 1 TABLET(3.125 MG) BY MOUTH TWICE DAILY WITH A MEAL 60 tablet 0  . cetirizine (ZYRTEC) 10 MG tablet Take 1 tablet (10 mg total) by mouth daily. 30 tablet 1  . docusate sodium (COLACE) 100 MG capsule Take 1 capsule (100 mg total) by mouth every 12 (twelve) hours. 60 capsule 0  . ELIQUIS 5 MG TABS tablet TAKE 1 TABLET(5 MG) BY MOUTH TWICE DAILY 60 tablet 5  . hydrocortisone cream 0.5 % Apply 1 application topically 2 (two) times daily. 30 g 1  . polyethylene glycol (MIRALAX / GLYCOLAX) 17 g packet Take 17 g by mouth daily. Please stop if you have diarrhea. 30 each 3  . tamsulosin (FLOMAX) 0.4 MG CAPS capsule TAKE 1 CAPSULE(0.4 MG) BY MOUTH DAILY 30 capsule 0  . triamcinolone cream (KENALOG) 0.1 % Apply 1 application topically 2 (two) times daily. 30 g 1  . lisinopril (ZESTRIL) 20 MG tablet Take 1 tablet (20 mg total) by mouth daily. 90 tablet 3   No facility-administered medications prior to visit.     ROS Review of Systems  Constitutional: Negative for activity change and appetite change.  HENT: Negative for sinus pressure and sore throat.   Eyes: Negative for visual disturbance.  Respiratory: Negative for cough, chest tightness and shortness of breath.   Cardiovascular: Negative for  chest pain and leg swelling.  Gastrointestinal: Negative for abdominal distention, abdominal pain, constipation and diarrhea.  Endocrine: Negative.   Genitourinary: Negative for dysuria, frequency and hematuria.  Musculoskeletal: Negative for joint swelling and myalgias.  Skin: Negative for rash.  Allergic/Immunologic: Negative.   Neurological: Negative for weakness, light-headedness and numbness.  Psychiatric/Behavioral: Negative for dysphoric mood and suicidal ideas.    Objective:  BP (!) 173/65   Pulse (!) 42   Ht 5\' 5"  (1.651 m)   Wt 171 lb (77.6 kg)   SpO2 99%   BMI 28.46 kg/m   BP/Weight 10/12/2019 06/09/2019 04/28/2019    Systolic BP 173 152 174  Diastolic BP 65 66 62  Wt. (Lbs) 171 175.6 173  BMI 28.46 29.22 28.79      Physical Exam Constitutional:      Appearance: He is well-developed.  Neck:     Vascular: No JVD.  Cardiovascular:     Rate and Rhythm: Normal rate.     Heart sounds: Normal heart sounds. No murmur heard.   Pulmonary:     Effort: Pulmonary effort is normal.     Breath sounds: Normal breath sounds. No wheezing or rales.  Chest:     Chest wall: No tenderness.  Abdominal:     General: Bowel sounds are normal. There is no distension.     Palpations: Abdomen is soft. There is no mass.     Tenderness: There is no abdominal tenderness.  Musculoskeletal:        General: Normal range of motion.     Right lower leg: No edema.     Left lower leg: No edema.  Neurological:     Mental Status: He is alert and oriented to person, place, and time.  Psychiatric:        Mood and Affect: Mood normal.     CMP Latest Ref Rng & Units 04/28/2019 03/08/2019 03/05/2019  Glucose 65 - 99 mg/dL 88 03/07/2019) 010(X)  BUN 10 - 36 mg/dL 17 11 19   Creatinine 0.76 - 1.27 mg/dL 323(F 5.73)  Sodium 134 - 144 mmol/L 145(H) 140 141  Potassium 3.5 - 5.2 mmol/L 4.5 3.7 4.3  Chloride 96 - 106 mmol/L 105 105 105  CO2 20 - 29 mmol/L 24 25 25   Calcium 8.6 - 10.2 mg/dL 9.1 9.0 8.9  Total Protein 6.5 - 8.1 g/dL - 7.0 6.6  Total Bilirubin 0.3 - 1.2 mg/dL - 2.20) 1.1  Alkaline Phos 38 - 126 U/L - 47 46  AST 15 - 41 U/L - 13(L) 16  ALT 0 - 44 U/L - 10 11    Lipid Panel     Component Value Date/Time   CHOL 186 04/28/2019 0941   TRIG 46 04/28/2019 0941   HDL 63 04/28/2019 0941   CHOLHDL 3.0 04/28/2019 0941   CHOLHDL 2.6 10/27/2015 0943   VLDL 10 10/27/2015 0943   LDLCALC 114 (H) 04/28/2019 0941    CBC    Component Value Date/Time   WBC 7.9 03/08/2019 0537   RBC 4.71 03/08/2019 0537   HGB 13.6 03/08/2019 0537   HCT 42.6 03/08/2019 0537   PLT 222 03/08/2019 0537   MCV 90.4 03/08/2019 0537    MCH 28.9 03/08/2019 0537   MCHC 31.9 03/08/2019 0537   RDW 13.5 03/08/2019 0537   LYMPHSABS 2.6 03/08/2019 0537   MONOABS 0.9 03/08/2019 0537   EOSABS 0.2 03/08/2019 0537   BASOSABS 0.1 03/08/2019 0537    No results found for: HGBA1C  Assessment & Plan:  1. Essential hypertension Uncontrolled due to the fact that he is yet to take his antihypertensive He has been advised to comply with his medications No regimen change today Counseled on blood pressure goal of less than 130/80, low-sodium, DASH diet, medication compliance, 150 minutes of moderate intensity exercise per week. Discussed medication compliance, adverse effects. - Basic Metabolic Panel - amLODipine (NORVASC) 10 MG tablet; Take 1 tablet (10 mg total) by mouth daily.  Dispense: 90 tablet; Refill: 1 - carvedilol (COREG) 3.125 MG tablet; TAKE 1 TABLET(3.125 MG) BY MOUTH TWICE DAILY WITH A MEAL  Dispense: 180 tablet; Refill: 1  2. Benign prostatic hyperplasia with urinary hesitancy He does have residual post micturition dribbling [facility] office called and appointment obtained for 10/14/19 - tamsulosin (FLOMAX) 0.4 MG CAPS capsule; TAKE 1 CAPSULE(0.4 MG) BY MOUTH DAILY  Dispense: 90 capsule; Refill: 1  3. Bradycardia HR of 42 in the Clinic - he is asymptomatic He is high risk due to multiple factors I am concerned since he is on a beta-blocker due to his A. fib Called the Cardiology office to inform them as he missed his previous appointment with Dr. Ladona Ridgel scheduled for 09/01/2019 for evaluation for ventricular ectopy We are yet to receive further instructions from cardiology and have held the patient for over 1 hour in the Clinic Reviewed ED precautions including calling 911 should he become symptomatic.  Once we receive further instructions from cardiology we will contact patient.  4. Paroxysmal atrial fibrillation (HCC) See #3 above Continue Eliquis.   65 minutes of total face to face time spent and greater  than 50% of time spent on discussing diagnosis, investigations, treatment plan, counseling, coordination of care, reaching out to specialists and making appointments for him.  Hoy Register, MD, FAAFP. [facility] [place], [place] [phone]   10/12/2019, 8:45 AM

## 2019-10-12 NOTE — Telephone Encounter (Signed)
STAT if HR is under 50 or over 120 (normal HR is 60-100 beats per minute)  1) What is your heart rate? 42  2) Do you have a log of your heart rate readings (document readings)?  42 31 45  3) Do you have any other symptoms? [facility] RN, Helmut Muster, called to express concern due to extremely low HR. She states the patient has not experienced additional symptoms. However, she would like a call back to discuss what the patient needs to do moving forward. Call may be returned to [phone].

## 2019-10-13 ENCOUNTER — Other Ambulatory Visit: Payer: Self-pay | Admitting: Family Medicine

## 2019-10-13 DIAGNOSIS — I10 Essential (primary) hypertension: Secondary | ICD-10-CM

## 2019-10-13 LAB — BASIC METABOLIC PANEL
BUN/Creatinine Ratio: 17 (ref 10–24)
BUN: 15 mg/dL (ref 10–36)
CO2: 26 mmol/L (ref 20–29)
Calcium: 9 mg/dL (ref 8.6–10.2)
Chloride: 105 mmol/L (ref 96–106)
Creatinine, Ser: 0.88 mg/dL (ref 0.76–1.27)
GFR calc Af Amer: 87 mL/min/{1.73_m2} (ref >59)
GFR calc non Af Amer: 75 mL/min/{1.73_m2} (ref >59)
Glucose: 93 mg/dL (ref 65–99)
Potassium: 5.4 mmol/L — ABNORMAL HIGH (ref 3.5–5.2)
Sodium: 143 mmol/L (ref 134–144)

## 2019-10-13 NOTE — Telephone Encounter (Signed)
Returned call to [place].  Per AT/Dr. Johney Frame- stop carvedilol.  Will make further hypertension medication adjustments at follow up appt with AT on October 25, 2019.  Helmut Muster states she will call Pt to advise to stop carvedilol

## 2019-10-22 ENCOUNTER — Telehealth: Payer: Self-pay

## 2019-10-22 NOTE — Telephone Encounter (Signed)
-----   Message from Hoy Register, MD sent at 10/13/2019 12:43 PM EDT ----- Potassium is elevated at 5.4. Please schedule him for repeat level in 2 weeks. Thanks

## 2019-10-22 NOTE — Telephone Encounter (Signed)
Call was placed to patient and a message was left for patient to return phone call.

## 2019-10-24 NOTE — Progress Notes (Signed)
PCP:  Hoy Register, MD Primary Cardiologist: Armanda Magic, MD Electrophysiologist: Hillis Range, MD   Jesus Morrison is a 86 y.o. male seen today for Hillis Range, MD for routine electrophysiology followup.  Since last being seen in our clinic the patient reports doing well. He was seen by PCP 10/12/2019 and noted to have low HRs on coreg, which we ordered stopped.  He has not taken his medications yet this am, but thinks he is still taking coreg.  he denies chest pain, palpitations, dyspnea, PND, orthopnea, nausea, vomiting, dizziness, syncope, edema, weight gain, or early satiety.  Past Medical History:  Diagnosis Date  . Arthritis    fingers and hands  . BPH (benign prostatic hypertrophy)    problems with acute urinary retention- has indwelling foley catheter  . Cold (disease)    last week ( week of Dec 22- Dec 29 ) - no longer coughing, no fever and feels better now  . Enlarged prostate   . Hypertension    Past Surgical History:  Procedure Laterality Date  .  Right elbow surgery as a child    . PROSTATECTOMY  04/17/2012   Procedure: PROSTATECTOMY RETROPUBIC;  Surgeon: Lindaann Slough, MD;  Location: [facility];  Service: Urology;  Laterality: N/A;  Simple Retropubic Prostatectomy      Current Outpatient Medications  Medication Sig Dispense Refill  . amLODipine (NORVASC) 10 MG tablet Take 1 tablet (10 mg total) by mouth daily. 90 tablet 1  . cetirizine (ZYRTEC) 10 MG tablet Take 1 tablet (10 mg total) by mouth daily. 30 tablet 1  . docusate sodium (COLACE) 100 MG capsule Take 1 capsule (100 mg total) by mouth every 12 (twelve) hours. 60 capsule 0  . ELIQUIS 5 MG TABS tablet TAKE 1 TABLET(5 MG) BY MOUTH TWICE DAILY 60 tablet 5  . hydrocortisone cream 0.5 % Apply 1 application topically 2 (two) times daily. 30 g 1  . lisinopril (ZESTRIL) 20 MG tablet Take 1 tablet (20 mg total) by mouth daily. 90 tablet 3  . polyethylene glycol (MIRALAX / GLYCOLAX) 17 g packet Take 17 g by mouth  daily. Please stop if you have diarrhea. 30 each 3  . tamsulosin (FLOMAX) 0.4 MG CAPS capsule TAKE 1 CAPSULE(0.4 MG) BY MOUTH DAILY 90 capsule 1  . triamcinolone cream (KENALOG) 0.1 % Apply 1 application topically 2 (two) times daily. 30 g 1   No current facility-administered medications for this visit.    No Known Allergies  Social History   Socioeconomic History  . Marital status: Single    Spouse name: Not on file  . Number of children: Not on file  . Years of education: Not on file  . Highest education level: Not on file  Occupational History  . Not on file  Tobacco Use  . Smoking status: Never Smoker  . Smokeless tobacco: Never Used  Substance and Sexual Activity  . Alcohol use: Yes  . Drug use: No  . Sexual activity: Not on file  Other Topics Concern  . Not on file  Social History Narrative  . Not on file   Social Determinants of Health   Financial Resource Strain:   . Difficulty of Paying Living Expenses:   Food Insecurity:   . Worried About Programme researcher, broadcasting/film/video in the Last Year:   . Barista in the Last Year:   Transportation Needs:   . Freight forwarder (Medical):   Marland Kitchen Lack of Transportation (Non-Medical):   Physical  Activity:   . Days of Exercise per Week:   . Minutes of Exercise per Session:   Stress:   . Feeling of Stress :   Social Connections:   . Frequency of Communication with Friends and Family:   . Frequency of Social Gatherings with Friends and Family:   . Attends Religious Services:   . Active Member of Clubs or Organizations:   . Attends Banker Meetings:   Marland Kitchen Marital Status:   Intimate Partner Violence:   . Fear of Current or Ex-Partner:   . Emotionally Abused:   Marland Kitchen Physically Abused:   . Sexually Abused:      Review of Systems: General: No chills, fever, night sweats or weight changes  Cardiovascular:  No chest pain, dyspnea on exertion, edema, orthopnea, palpitations, paroxysmal nocturnal  dyspnea Dermatological: No rash, lesions or masses Respiratory: No cough, dyspnea Urologic: No hematuria, dysuria Abdominal: No nausea, vomiting, diarrhea, bright red blood per rectum, melena, or hematemesis Neurologic: No visual changes, weakness, changes in mental status All other systems reviewed and are otherwise negative except as noted above.  Physical Exam: Vitals:   10/25/19 0808  BP: (!) 160/58  Pulse: (!) 51  SpO2: 99%  Weight: 172 lb (78 kg)  Height: 5\' 5"  (1.651 m)    GEN- The patient is well appearing, alert and oriented x 3 today.   HEENT: normocephalic, atraumatic; sclera clear, conjunctiva pink; hearing intact; oropharynx clear; neck supple, no JVP Lymph- no cervical lymphadenopathy Lungs- Clear to ausculation bilaterally, normal work of breathing.  No wheezes, rales, rhonchi Heart- Regular rate and rhythm, no murmurs, rubs or gallops, PMI not laterally displaced GI- soft, non-tender, non-distended, bowel sounds present, no hepatosplenomegaly Extremities- no clubbing, cyanosis, or edema; DP/PT/radial pulses 2+ bilaterally MS- no significant deformity or atrophy Skin- warm and dry, no rash or lesion Psych- euthymic mood, full affect Neuro- strength and sensation are intact  EKG is ordered. Personal review of EKG from today shows sinus bradycardia at 51 bpm with 1st degree AV block and a single PVC  Additional studies reviewed include: Previous EP office notes, PCP notes, Monitor.   Assessment and Plan:  1. VT Pt shown to have RVOT VT (benign focal VT) in the setting of acute PTE with preserved EF No indication for ICD or further EP interventions He has had PVCs and NSVT, but asymptomatic and EF normal Given age, would treat conservatively and avoid BB to avoid need for pacing. Discussed with Dr. previously. Echo 02/09/2019 with LVEF 60-65  2. Paroxysmal atrial fibrillation EKG today shows sinus bradycardia Avoid AV nodal blocking agents. (Stop  coreg, pt still has in bag but not 100% sure if he is taking) Continue Eliquis for CHA2DS2VASC of 3. No bleeding.   3. HTN Continue lisonopril 20 mg daily. Will not increase today  Would avoid AV nodal blockade as above. Will resume imdur at 30 mg daily. He tolerated this medication well, but it was abandoned in favor of BBs during admission 01/2019. Can increase + add hydralazine as needed.  4. PTE On Eliquis.  Denies bleeding. Encouraged compliance.   5. Hyperkalemia BMET today to follow.   We discussed the indications for pacemakers and ICDs. Pt would prefer to avoid these if possible. Currently has no indication for ICD with VT 2/2 to PE and normal EF or PPM with asymptomatic sinus bradycardia while on BB.   RTC 4 months. Sooner as needed.  02/2019, PA-C  10/25/19 8:18 AM

## 2019-10-25 ENCOUNTER — Encounter: Payer: Self-pay | Admitting: Student

## 2019-10-25 ENCOUNTER — Ambulatory Visit (INDEPENDENT_AMBULATORY_CARE_PROVIDER_SITE_OTHER): Payer: 59 | Admitting: Student

## 2019-10-25 ENCOUNTER — Other Ambulatory Visit: Payer: Self-pay

## 2019-10-25 VITALS — BP 160/58 | HR 51 | Ht 65.0 in | Wt 172.0 lb

## 2019-10-25 DIAGNOSIS — I472 Ventricular tachycardia, unspecified: Secondary | ICD-10-CM

## 2019-10-25 DIAGNOSIS — I480 Paroxysmal atrial fibrillation: Secondary | ICD-10-CM | POA: Diagnosis not present

## 2019-10-25 DIAGNOSIS — I10 Essential (primary) hypertension: Secondary | ICD-10-CM

## 2019-10-25 LAB — BASIC METABOLIC PANEL
BUN/Creatinine Ratio: 18 (ref 10–24)
BUN: 17 mg/dL (ref 10–36)
CO2: 26 mmol/L (ref 20–29)
Calcium: 9.3 mg/dL (ref 8.6–10.2)
Chloride: 106 mmol/L (ref 96–106)
Creatinine, Ser: 0.95 mg/dL (ref 0.76–1.27)
GFR calc Af Amer: 81 mL/min/{1.73_m2} (ref >59)
GFR calc non Af Amer: 70 mL/min/{1.73_m2} (ref >59)
Glucose: 75 mg/dL (ref 65–99)
Potassium: 5 mmol/L (ref 3.5–5.2)
Sodium: 143 mmol/L (ref 134–144)

## 2019-10-25 MED ORDER — ISOSORBIDE MONONITRATE ER 30 MG PO TB24
30.0000 mg | ORAL_TABLET | Freq: Every day | ORAL | 3 refills | Status: DC
Start: 2019-10-25 — End: 2020-03-01

## 2019-10-25 NOTE — Patient Instructions (Signed)
Medication Instructions:  Your physician has recommended you make the following change in your medication:  1) Stop Carvedilol 2) Start Imdur (Isosorbide) 30 mg-- take one tablet daily  *If you need a refill on your cardiac medications before your next appointment, please call your pharmacy*   Lab Work: Your physician recommends that you have labs today: BMP  If you have labs (blood work) drawn today and your tests are completely normal, you will receive your results only by:  MyChart Message (if you have MyChart) OR  A paper copy in the mail If you have any lab test that is abnormal or we need to change your treatment, we will call you to review the results.   Testing/Procedures: None Ordered   Follow-Up: At [facility], you and your health needs are our priority.  As part of our continuing mission to provide you with exceptional heart care, we have created designated Provider Care Teams.  These Care Teams include your primary Cardiologist (physician) and Advanced Practice Providers (APPs -  Physician Assistants and Nurse Practitioners) who all work together to provide you with the care you need, when you need it.  We recommend signing up for the patient portal called "MyChart".  Sign up information is provided on this After Visit Summary.  MyChart is used to connect with patients for Virtual Visits (Telemedicine).  Patients are able to view lab/test results, encounter notes, upcoming appointments, etc.  Non-urgent messages can be sent to your provider as well.   To learn more about what you can do with MyChart, go to [email or web address].    Your next appointment:   4 month(s) with Otilio Saber, PA  The format for your next appointment:   Either In Person or Virtual

## 2019-10-26 ENCOUNTER — Other Ambulatory Visit: Payer: Self-pay | Admitting: Family Medicine

## 2019-10-26 DIAGNOSIS — I10 Essential (primary) hypertension: Secondary | ICD-10-CM

## 2020-01-12 ENCOUNTER — Ambulatory Visit: Payer: 59 | Admitting: Family Medicine

## 2020-02-18 ENCOUNTER — Other Ambulatory Visit: Payer: Self-pay | Admitting: Student

## 2020-02-24 ENCOUNTER — Other Ambulatory Visit: Payer: Self-pay | Admitting: Student

## 2020-02-24 NOTE — Progress Notes (Deleted)
PCP:  Hoy Register, MD Primary Cardiologist: Armanda Magic, MD Electrophysiologist: Hillis Range, MD   Jesus Morrison is a 86 y.o. male seen today for Hillis Range, MD for routine electrophysiology followup.  Since last being seen in our clinic the patient reports doing ***.  he denies chest pain, palpitations, dyspnea, PND, orthopnea, nausea, vomiting, dizziness, syncope, edema, weight gain, or early satiety.  Past Medical History:  Diagnosis Date  . Arthritis    fingers and hands  . BPH (benign prostatic hypertrophy)    problems with acute urinary retention- has indwelling foley catheter  . Cold (disease)    last week ( week of Dec 22- Dec 29 ) - no longer coughing, no fever and feels better now  . Enlarged prostate   . Hypertension    Past Surgical History:  Procedure Laterality Date  .  Right elbow surgery as a child    . PROSTATECTOMY  04/17/2012   Procedure: PROSTATECTOMY RETROPUBIC;  Surgeon: Lindaann Slough, MD;  Location: [facility];  Service: Urology;  Laterality: N/A;  Simple Retropubic Prostatectomy      Current Outpatient Medications  Medication Sig Dispense Refill  . amLODipine (NORVASC) 10 MG tablet Take 1 tablet (10 mg total) by mouth daily. 90 tablet 1  . cetirizine (ZYRTEC) 10 MG tablet Take 1 tablet (10 mg total) by mouth daily. 30 tablet 1  . docusate sodium (COLACE) 100 MG capsule Take 1 capsule (100 mg total) by mouth every 12 (twelve) hours. 60 capsule 0  . ELIQUIS 5 MG TABS tablet TAKE 1 TABLET(5 MG) BY MOUTH TWICE DAILY 60 tablet 5  . hydrocortisone cream 0.5 % Apply 1 application topically 2 (two) times daily. 30 g 1  . isosorbide mononitrate (IMDUR) 30 MG 24 hr tablet Take 1 tablet (30 mg total) by mouth daily. 90 tablet 3  . lisinopril (ZESTRIL) 20 MG tablet Take 1 tablet (20 mg total) by mouth daily. 90 tablet 3  . polyethylene glycol (MIRALAX / GLYCOLAX) 17 g packet Take 17 g by mouth daily. Please stop if you have diarrhea. 30 each 3  . tamsulosin  (FLOMAX) 0.4 MG CAPS capsule TAKE 1 CAPSULE(0.4 MG) BY MOUTH DAILY 90 capsule 1  . triamcinolone cream (KENALOG) 0.1 % Apply 1 application topically 2 (two) times daily. 30 g 1   No current facility-administered medications for this visit.    No Known Allergies  Social History   Socioeconomic History  . Marital status: Single    Spouse name: Not on file  . Number of children: Not on file  . Years of education: Not on file  . Highest education level: Not on file  Occupational History  . Not on file  Tobacco Use  . Smoking status: Never Smoker  . Smokeless tobacco: Never Used  Substance and Sexual Activity  . Alcohol use: Yes  . Drug use: No  . Sexual activity: Not on file  Other Topics Concern  . Not on file  Social History Narrative  . Not on file   Social Determinants of Health   Financial Resource Strain:   . Difficulty of Paying Living Expenses: Not on file  Food Insecurity:   . Worried About Programme researcher, broadcasting/film/video in the Last Year: Not on file  . Ran Out of Food in the Last Year: Not on file  Transportation Needs:   . Lack of Transportation (Medical): Not on file  . Lack of Transportation (Non-Medical): Not on file  Physical Activity:   .  Days of Exercise per Week: Not on file  . Minutes of Exercise per Session: Not on file  Stress:   . Feeling of Stress : Not on file  Social Connections:   . Frequency of Communication with Friends and Family: Not on file  . Frequency of Social Gatherings with Friends and Family: Not on file  . Attends Religious Services: Not on file  . Active Member of Clubs or Organizations: Not on file  . Attends Banker Meetings: Not on file  . Marital Status: Not on file  Intimate Partner Violence:   . Fear of Current or Ex-Partner: Not on file  . Emotionally Abused: Not on file  . Physically Abused: Not on file  . Sexually Abused: Not on file     Review of Systems: General: No chills, fever, night sweats or weight  changes  Cardiovascular:  No chest pain, dyspnea on exertion, edema, orthopnea, palpitations, paroxysmal nocturnal dyspnea Dermatological: No rash, lesions or masses Respiratory: No cough, dyspnea Urologic: No hematuria, dysuria Abdominal: No nausea, vomiting, diarrhea, bright red blood per rectum, melena, or hematemesis Neurologic: No visual changes, weakness, changes in mental status All other systems reviewed and are otherwise negative except as noted above.  Physical Exam: There were no vitals filed for this visit.  GEN- The patient is well appearing, alert and oriented x 3 today.   HEENT: normocephalic, atraumatic; sclera clear, conjunctiva pink; hearing intact; oropharynx clear; neck supple, no JVP Lymph- no cervical lymphadenopathy Lungs- Clear to ausculation bilaterally, normal work of breathing.  No wheezes, rales, rhonchi Heart- Regular rate and rhythm, no murmurs, rubs or gallops, PMI not laterally displaced GI- soft, non-tender, non-distended, bowel sounds present, no hepatosplenomegaly Extremities- no clubbing, cyanosis, or edema; DP/PT/radial pulses 2+ bilaterally MS- no significant deformity or atrophy Skin- warm and dry, no rash or lesion Psych- euthymic mood, full affect Neuro- strength and sensation are intact  EKG is not ordered. Personal review of EKG from 10/25/2019 shows sinus brady at 51 bpm and a PVC, 1st degree AV block at 228 ms  Additional studies reviewed include: ***  Assessment and Plan:  1.VT Pt shown to have RVOT VT (benign focal VT) in the setting of acute PTE with preserved EF No indication for ICD or further EP interventions He has had PVCs and NSVT, but asymptomatic and EF normal Given age, would treat conservatively and avoid BB to avoid need for pacing.  Echo 02/09/2019 with LVEF 60-65 No change at this time. Reviewed alarm symptoms such as chest pain, syncope, or severe SOB  2. Paroxysmal atrial fibrillation Regular on exam. Avoid AV  nodal blocking agents. (Stop coreg, pt still has in bag but not 100% sure if he is taking) Continue Eliquis forCHA2DS2VASC of3. Denies bleeding.   3.HTN Continue current medications Would avoid AV nodal blockade as above. Continue imdur.   4. PTE On Eliquis.  No bleeding.   5. Hyperkalemia BMET today to follow.   6. Sinus bradycardia Has previously had on beta blockers.  We have previously discussed the indications for pacemakers and ICDs. Pt would prefer to avoid these if possible. Currently has no indication for ICD with VT 2/2 to PE and normal EF or PPM with asymptomatic sinus bradycardia while on BB.   Graciella Freer, PA-C  02/24/20 3:45 PM

## 2020-02-25 ENCOUNTER — Ambulatory Visit: Payer: 59 | Admitting: Student

## 2020-03-01 ENCOUNTER — Other Ambulatory Visit: Payer: Self-pay

## 2020-03-01 ENCOUNTER — Ambulatory Visit: Payer: 59 | Admitting: Family Medicine

## 2020-03-01 ENCOUNTER — Ambulatory Visit: Payer: 59 | Attending: Family Medicine | Admitting: Family Medicine

## 2020-03-01 ENCOUNTER — Encounter: Payer: Self-pay | Admitting: Family Medicine

## 2020-03-01 VITALS — BP 168/70 | HR 66 | Ht 65.0 in | Wt 171.0 lb

## 2020-03-01 DIAGNOSIS — Z23 Encounter for immunization: Secondary | ICD-10-CM

## 2020-03-01 DIAGNOSIS — I480 Paroxysmal atrial fibrillation: Secondary | ICD-10-CM

## 2020-03-01 DIAGNOSIS — I10 Essential (primary) hypertension: Secondary | ICD-10-CM

## 2020-03-01 DIAGNOSIS — R3911 Hesitancy of micturition: Secondary | ICD-10-CM

## 2020-03-01 DIAGNOSIS — N401 Enlarged prostate with lower urinary tract symptoms: Secondary | ICD-10-CM | POA: Diagnosis not present

## 2020-03-01 MED ORDER — ISOSORBIDE MONONITRATE ER 60 MG PO TB24: 60.0000 mg | ORAL_TABLET | Freq: Every day | ORAL | 1 refills | Status: DC

## 2020-03-01 MED ORDER — APIXABAN 5 MG PO TABS: ORAL_TABLET | ORAL | 1 refills | Status: DC

## 2020-03-01 MED ORDER — TAMSULOSIN HCL 0.4 MG PO CAPS: ORAL_CAPSULE | ORAL | 1 refills | Status: DC

## 2020-03-01 MED ORDER — LISINOPRIL 20 MG PO TABS: 20.0000 mg | ORAL_TABLET | Freq: Every day | ORAL | 3 refills | Status: DC

## 2020-03-01 MED ORDER — AMLODIPINE BESYLATE 10 MG PO TABS: 10.0000 mg | ORAL_TABLET | Freq: Every day | ORAL | 1 refills | Status: DC

## 2020-03-01 NOTE — Progress Notes (Signed)
States that he can not control his urine.

## 2020-03-01 NOTE — Progress Notes (Signed)
Subjective:  Patient ID: Jesus Morrison, male    DOB: 09/30/1926  Age: 86 y.o. MRN: [id number]  CC: Hypertension   HPI Jesus Morrison  is a 86 year old male with a history of hypertension, history of BPH status post retropubic prostatectomy in 04/2012, pulmonary embolism diagnosed in 02/2019, paroxysmal A. fib, V. tach in the setting of acute pulmonary thromboembolismwho consents today for chronic disease management. Complains of inability 'to control his urine' and complains of urine dribbling.  I had referred him to urology given his previous history of prostatectomy and ongoing Flomax use with persistent symptoms however I am unsure if the patient kept this appointment and the patient does not recall being referred or going there.  We had printed his appointment for him on his discharge summary after his last office visit. He endorses compliance with his antihypertensive however his blood pressure is elevated. For his A. fib he is on Eliquis.  Denies chest pain, lightheadedness, dyspnea, pedal edema.  Last seen by cardiology in 10/2019 at which time carvedilol was discontinued due to bradycardia and Imdur started  Past Medical History:  Diagnosis Date  . Arthritis    fingers and hands  . BPH (benign prostatic hypertrophy)    problems with acute urinary retention- has indwelling foley catheter  . Cold (disease)    last week ( week of Dec 22- Dec 29 ) - no longer coughing, no fever and feels better now  . Enlarged prostate   . Hypertension     Past Surgical History:  Procedure Laterality Date  .  Right elbow surgery as a child    . PROSTATECTOMY  04/17/2012   Procedure: PROSTATECTOMY RETROPUBIC;  Surgeon: Lindaann Slough, MD;  Location: [facility];  Service: Urology;  Laterality: N/A;  Simple Retropubic Prostatectomy      Family History  Problem Relation Age of Onset  . Liver disease Father   . Heart disease Brother     No Known Allergies  Outpatient Medications Prior to Visit    Medication Sig Dispense Refill  . cetirizine (ZYRTEC) 10 MG tablet Take 1 tablet (10 mg total) by mouth daily. 30 tablet 1  . docusate sodium (COLACE) 100 MG capsule Take 1 capsule (100 mg total) by mouth every 12 (twelve) hours. 60 capsule 0  . hydrocortisone cream 0.5 % Apply 1 application topically 2 (two) times daily. 30 g 1  . polyethylene glycol (MIRALAX / GLYCOLAX) 17 g packet Take 17 g by mouth daily. Please stop if you have diarrhea. 30 each 3  . triamcinolone cream (KENALOG) 0.1 % Apply 1 application topically 2 (two) times daily. 30 g 1  . amLODipine (NORVASC) 10 MG tablet Take 1 tablet (10 mg total) by mouth daily. 90 tablet 1  . ELIQUIS 5 MG TABS tablet TAKE 1 TABLET(5 MG) BY MOUTH TWICE DAILY 60 tablet 5  . tamsulosin (FLOMAX) 0.4 MG CAPS capsule TAKE 1 CAPSULE(0.4 MG) BY MOUTH DAILY 90 capsule 1  . isosorbide mononitrate (IMDUR) 30 MG 24 hr tablet Take 1 tablet (30 mg total) by mouth daily. 90 tablet 3  . lisinopril (ZESTRIL) 20 MG tablet Take 1 tablet (20 mg total) by mouth daily. 90 tablet 3   No facility-administered medications prior to visit.     ROS Review of Systems  Constitutional: Negative for activity change and appetite change.  HENT: Negative for sinus pressure and sore throat.   Eyes: Negative for visual disturbance.  Respiratory: Negative for cough, chest tightness and shortness  of breath.   Cardiovascular: Negative for chest pain and leg swelling.  Gastrointestinal: Negative for abdominal distention, abdominal pain, constipation and diarrhea.  Endocrine: Negative.   Genitourinary: Negative for dysuria.  Musculoskeletal: Negative for joint swelling and myalgias.  Skin: Negative for rash.  Allergic/Immunologic: Negative.   Neurological: Negative for weakness, light-headedness and numbness.  Psychiatric/Behavioral: Negative for dysphoric mood and suicidal ideas.    Objective:  BP (!) 168/70   Pulse 66   Ht 5\' 5"  (1.651 m)   Wt 171 lb (77.6 kg)   SpO2  100%   BMI 28.46 kg/m   BP/Weight 03/01/2020 10/25/2019 10/12/2019  Systolic BP 168 160 173  Diastolic BP 70 58 65  Wt. (Lbs) 171 172 171  BMI 28.46 28.62 28.46      Physical Exam Constitutional:      Appearance: He is well-developed.  Neck:     Vascular: No JVD.  Cardiovascular:     Rate and Rhythm: Normal rate.     Heart sounds: Normal heart sounds. No murmur heard.   Pulmonary:     Effort: Pulmonary effort is normal.     Breath sounds: Normal breath sounds. No wheezing or rales.  Chest:     Chest wall: No tenderness.  Abdominal:     General: Bowel sounds are normal. There is no distension.     Palpations: Abdomen is soft. There is no mass.     Tenderness: There is no abdominal tenderness.  Musculoskeletal:        General: Normal range of motion.     Right lower leg: No edema.     Left lower leg: No edema.  Neurological:     Mental Status: He is alert and oriented to person, place, and time.  Psychiatric:        Mood and Affect: Mood normal.     CMP Latest Ref Rng & Units 10/25/2019 10/12/2019 04/28/2019  Glucose 65 - 99 mg/dL 75 93 88  BUN 10 - 36 mg/dL 17 15 17   Creatinine 0.76 - 1.27 mg/dL 04/30/2019 3.66  Sodium 134 - 144 mmol/L 143 143 145(H)  Potassium 3.5 - 5.2 mmol/L 5.0 5.4(H) 4.5  Chloride 96 - 106 mmol/L 106 105 105  CO2 20 - 29 mmol/L 26 26 24   Calcium 8.6 - 10.2 mg/dL 9.3 9.0 9.1  Total Protein 6.5 - 8.1 g/dL - - -  Total Bilirubin 0.3 - 1.2 mg/dL - - -  Alkaline Phos 38 - 126 U/L - - -  AST 15 - 41 U/L - - -  ALT 0 - 44 U/L - - -    Lipid Panel     Component Value Date/Time   CHOL 186 04/28/2019 0941   TRIG 46 04/28/2019 0941   HDL 63 04/28/2019 0941   CHOLHDL 3.0 04/28/2019 0941   CHOLHDL 2.6 10/27/2015 0943   VLDL 10 10/27/2015 0943   LDLCALC 114 (H) 04/28/2019 0941    CBC    Component Value Date/Time   WBC 7.9 03/08/2019 0537   RBC 4.71 03/08/2019 0537   HGB 13.6 03/08/2019 0537   HCT 42.6 03/08/2019 0537   PLT 222 03/08/2019  0537   MCV 90.4 03/08/2019 0537   MCH 28.9 03/08/2019 0537   MCHC 31.9 03/08/2019 0537   RDW 13.5 03/08/2019 0537   LYMPHSABS 2.6 03/08/2019 0537   MONOABS 0.9 03/08/2019 0537   EOSABS 0.2 03/08/2019 0537   BASOSABS 0.1 03/08/2019 0537    No results found for:  HGBA1C  Assessment & Plan:  1. Benign prostatic hyperplasia with urinary hesitancy Previous history of retropubic prostatectomy Uncontrolled We will give the Urology office a call to see if he made his appointment - tamsulosin (FLOMAX) 0.4 MG CAPS capsule; TAKE 1 CAPSULE(0.4 MG) BY MOUTH DAILY  Dispense: 90 capsule; Refill: 1  2. Paroxysmal atrial fibrillation (HCC) Currently in sinus rhythm Continue anticoagulation Not on beta-blocker per cardiology Follow-up with cardiology - apixaban (ELIQUIS) 5 MG TABS tablet; TAKE 1 TABLET(5 MG) BY MOUTH TWICE DAILY  Dispense: 180 tablet; Refill: 1  3. Essential hypertension Uncontrolled Increase isosorbide dose - isosorbide mononitrate (IMDUR) 60 MG 24 hr tablet; Take 1 tablet (60 mg total) by mouth daily.  Dispense: 90 tablet; Refill: 1 - lisinopril (ZESTRIL) 20 MG tablet; Take 1 tablet (20 mg total) by mouth daily.  Dispense: 90 tablet; Refill: 3 - amLODipine (NORVASC) 10 MG tablet; Take 1 tablet (10 mg total) by mouth daily.  Dispense: 90 tablet; Refill: 1 - Basic Metabolic Panel  4. Need for immunization against influenza - Flu Vaccine QUAD 36+ mos IM   Meds ordered this encounter  Medications  . isosorbide mononitrate (IMDUR) 60 MG 24 hr tablet    Sig: Take 1 tablet (60 mg total) by mouth daily.    Dispense:  90 tablet    Refill:  1    Dose increase  . lisinopril (ZESTRIL) 20 MG tablet    Sig: Take 1 tablet (20 mg total) by mouth daily.    Dispense:  90 tablet    Refill:  3  . tamsulosin (FLOMAX) 0.4 MG CAPS capsule    Sig: TAKE 1 CAPSULE(0.4 MG) BY MOUTH DAILY    Dispense:  90 capsule    Refill:  1  . apixaban (ELIQUIS) 5 MG TABS tablet    Sig: TAKE 1 TABLET(5  MG) BY MOUTH TWICE DAILY    Dispense:  180 tablet    Refill:  1  . amLODipine (NORVASC) 10 MG tablet    Sig: Take 1 tablet (10 mg total) by mouth daily.    Dispense:  90 tablet    Refill:  1    Follow-up: Return in about 3 months (around 06/01/2020) for chronic disease management.       Hoy Register, MD, FAAFP. [facility] [place], [place] [phone]   03/01/2020, 6:06 PM

## 2020-03-02 LAB — BASIC METABOLIC PANEL
BUN/Creatinine Ratio: 16 (ref 10–24)
BUN: 17 mg/dL (ref 10–36)
CO2: 25 mmol/L (ref 20–29)
Calcium: 8.9 mg/dL (ref 8.6–10.2)
Chloride: 106 mmol/L (ref 96–106)
Creatinine, Ser: 1.09 mg/dL (ref 0.76–1.27)
GFR calc Af Amer: 68 mL/min/{1.73_m2} (ref >59)
GFR calc non Af Amer: 59 mL/min/{1.73_m2} — ABNORMAL LOW (ref >59)
Glucose: 81 mg/dL (ref 65–99)
Potassium: 4.3 mmol/L (ref 3.5–5.2)
Sodium: 146 mmol/L — ABNORMAL HIGH (ref 134–144)

## 2020-03-03 ENCOUNTER — Telehealth: Payer: Self-pay

## 2020-03-03 NOTE — Telephone Encounter (Signed)
-----   Message from Hoy Register, MD sent at 03/02/2020  1:38 PM EST ----- Labs are stable

## 2020-03-03 NOTE — Telephone Encounter (Signed)
Patient was called and a voicemail was left informing patient to return phone call for lab results. 

## 2020-04-23 ENCOUNTER — Other Ambulatory Visit: Payer: Self-pay | Admitting: Student

## 2020-04-23 DIAGNOSIS — I480 Paroxysmal atrial fibrillation: Secondary | ICD-10-CM

## 2020-05-23 ENCOUNTER — Other Ambulatory Visit: Payer: Self-pay | Admitting: Family Medicine

## 2020-05-23 DIAGNOSIS — I10 Essential (primary) hypertension: Secondary | ICD-10-CM

## 2020-06-01 ENCOUNTER — Other Ambulatory Visit: Payer: Self-pay

## 2020-06-01 ENCOUNTER — Ambulatory Visit: Payer: Medicare Other | Attending: Family Medicine | Admitting: Family Medicine

## 2020-06-01 ENCOUNTER — Encounter: Payer: Self-pay | Admitting: Family Medicine

## 2020-06-01 VITALS — BP 165/63 | HR 57 | Ht 65.0 in | Wt 175.0 lb

## 2020-06-01 DIAGNOSIS — N401 Enlarged prostate with lower urinary tract symptoms: Secondary | ICD-10-CM | POA: Diagnosis not present

## 2020-06-01 DIAGNOSIS — I10 Essential (primary) hypertension: Secondary | ICD-10-CM

## 2020-06-01 DIAGNOSIS — I480 Paroxysmal atrial fibrillation: Secondary | ICD-10-CM

## 2020-06-01 DIAGNOSIS — N3943 Post-void dribbling: Secondary | ICD-10-CM

## 2020-06-01 NOTE — Progress Notes (Signed)
Subjective:  Patient ID: Jesus Morrison, male    DOB: June 01, 1926  Age: 87 y.o. MRN: [id number]  CC: Hypertension   HPI Jesus Morrison  is a 87 year old malewith a history of hypertension, history of BPH status post retropubic prostatectomy in 04/2012, pulmonary embolism diagnosed in 02/2019, paroxysmal A. fib, V. tach in the setting of acute pulmonary thromboembolismwho consents today for chronic disease management. BP is elevated and he is yet to take his medications but has them in his pocket.  His daughter assists with management of his medications. He denies presence of dyspnea, chest pain, palpitations and has not noticed abnormal bleeding despite use of Eliquis.  For his BPH he is followed by urology and informs me he has been compliant with his appointments. He denies acute concerns today.  Past Medical History:  Diagnosis Date  . Arthritis    fingers and hands  . BPH (benign prostatic hypertrophy)    problems with acute urinary retention- has indwelling foley catheter  . Cold (disease)    last week ( week of Dec 22- Dec 29 ) - no longer coughing, no fever and feels better now  . Enlarged prostate   . Hypertension     Past Surgical History:  Procedure Laterality Date  .  Right elbow surgery as a child    . PROSTATECTOMY  04/17/2012   Procedure: PROSTATECTOMY RETROPUBIC;  Surgeon: Lindaann Slough, MD;  Location: [facility];  Service: Urology;  Laterality: N/A;  Simple Retropubic Prostatectomy      Family History  Problem Relation Age of Onset  . Liver disease Father   . Heart disease Brother     No Known Allergies  Outpatient Medications Prior to Visit  Medication Sig Dispense Refill  . amLODipine (NORVASC) 10 MG tablet TAKE 1 TABLET(10 MG) BY MOUTH DAILY 90 tablet 0  . apixaban (ELIQUIS) 5 MG TABS tablet TAKE 1 TABLET(5 MG) BY MOUTH TWICE DAILY 180 tablet 1  . cetirizine (ZYRTEC) 10 MG tablet Take 1 tablet (10 mg total) by mouth daily. 30 tablet 1  . docusate sodium  (COLACE) 100 MG capsule Take 1 capsule (100 mg total) by mouth every 12 (twelve) hours. 60 capsule 0  . hydrocortisone cream 0.5 % Apply 1 application topically 2 (two) times daily. 30 g 1  . polyethylene glycol (MIRALAX / GLYCOLAX) 17 g packet Take 17 g by mouth daily. Please stop if you have diarrhea. 30 each 3  . tamsulosin (FLOMAX) 0.4 MG CAPS capsule TAKE 1 CAPSULE(0.4 MG) BY MOUTH DAILY 90 capsule 1  . triamcinolone cream (KENALOG) 0.1 % Apply 1 application topically 2 (two) times daily. 30 g 1  . isosorbide mononitrate (IMDUR) 60 MG 24 hr tablet Take 1 tablet (60 mg total) by mouth daily. 90 tablet 1  . lisinopril (ZESTRIL) 20 MG tablet Take 1 tablet (20 mg total) by mouth daily. 90 tablet 3   No facility-administered medications prior to visit.     ROS Review of Systems  Constitutional: Negative for activity change and appetite change.  HENT: Negative for sinus pressure and sore throat.   Eyes: Negative for visual disturbance.  Respiratory: Negative for cough, chest tightness and shortness of breath.   Cardiovascular: Negative for chest pain and leg swelling.  Gastrointestinal: Negative for abdominal distention, abdominal pain, constipation and diarrhea.  Endocrine: Negative.   Genitourinary: Negative for dysuria.  Musculoskeletal: Negative for joint swelling and myalgias.  Skin: Negative for rash.  Allergic/Immunologic: Negative.   Neurological: Negative  for weakness, light-headedness and numbness.  Psychiatric/Behavioral: Negative for dysphoric mood and suicidal ideas.    Objective:  BP (!) 165/63   Pulse (!) 57   Ht 5\' 5"  (1.651 m)   Wt 175 lb (79.4 kg)   SpO2 99%   BMI 29.12 kg/m   BP/Weight 06/01/2020 03/01/2020 10/25/2019  Systolic BP 165 168 160  Diastolic BP 63 70 58  Wt. (Lbs) 175 171 172  BMI 29.12 28.46 28.62      Physical Exam Constitutional:      Appearance: He is well-developed.  Neck:     Vascular: No JVD.  Cardiovascular:     Rate and Rhythm:  Bradycardia present.     Heart sounds: Murmur heard.  Gallop present. S3 sounds present.   Pulmonary:     Effort: Pulmonary effort is normal.     Breath sounds: Normal breath sounds. No wheezing or rales.  Chest:     Chest wall: No tenderness.  Abdominal:     General: Bowel sounds are normal. There is no distension.     Palpations: Abdomen is soft. There is no mass.     Tenderness: There is no abdominal tenderness.  Musculoskeletal:        General: Normal range of motion.     Right lower leg: No edema (occassional).     Left lower leg: No edema.  Neurological:     Mental Status: He is alert and oriented to person, place, and time.  Psychiatric:        Mood and Affect: Mood normal.     CMP Latest Ref Rng & Units 03/01/2020 10/25/2019 10/12/2019  Glucose 65 - 99 mg/dL 81 75 93  BUN 10 - 36 mg/dL 17 17 15   Creatinine 0.76 - 1.27 mg/dL 10/14/2019 4.03  Sodium 134 - 144 mmol/L 146(H) 143 143  Potassium 3.5 - 5.2 mmol/L 4.3 5.0 5.4(H)  Chloride 96 - 106 mmol/L 106 106 105  CO2 20 - 29 mmol/L 25 26 26   Calcium 8.6 - 10.2 mg/dL 8.9 9.3 9.0  Total Protein 6.5 - 8.1 g/dL - - -  Total Bilirubin 0.3 - 1.2 mg/dL - - -  Alkaline Phos 38 - 126 U/L - - -  AST 15 - 41 U/L - - -  ALT 0 - 44 U/L - - -    Lipid Panel     Component Value Date/Time   CHOL 186 04/28/2019 0941   TRIG 46 04/28/2019 0941   HDL 63 04/28/2019 0941   CHOLHDL 3.0 04/28/2019 0941   CHOLHDL 2.6 10/27/2015 0943   VLDL 10 10/27/2015 0943   LDLCALC 114 (H) 04/28/2019 0941    CBC    Component Value Date/Time   WBC 7.9 03/08/2019 0537   RBC 4.71 03/08/2019 0537   HGB 13.6 03/08/2019 0537   HCT 42.6 03/08/2019 0537   PLT 222 03/08/2019 0537   MCV 90.4 03/08/2019 0537   MCH 28.9 03/08/2019 0537   MCHC 31.9 03/08/2019 0537   RDW 13.5 03/08/2019 0537   LYMPHSABS 2.6 03/08/2019 0537   MONOABS 0.9 03/08/2019 0537   EOSABS 0.2 03/08/2019 0537   BASOSABS 0.1 03/08/2019 0537    No results found for:  HGBA1C  Assessment & Plan:  1. Paroxysmal atrial fibrillation (HCC) Stable Continue anticoagulation with Eliquis  Not on rate control-he is bradycardic  2. Essential hypertension Uncontrolled due to the fact that he is yet to take his antihypertensive Compliance has been emphasized Counseled on blood pressure  goal of less than 130/80, low-sodium, DASH diet, medication compliance, 150 minutes of moderate intensity exercise per week. Discussed medication compliance, adverse effects.  3. Benign prostatic hyperplasia with post-void dribbling Stable Continue Flomax Followed by urology    No orders of the defined types were placed in this encounter.   Follow-up: Return in about 3 months (around 08/29/2020) for Chronic disease management.       Hoy Register, MD, FAAFP. [facility] and [facility]ness [place], [place] [phone]   06/01/2020, 12:14 PM

## 2020-06-04 IMAGING — CT CT ANGIO CHEST
2 of 7 series · 17 of 46 positions shown · IV contrast (APPLIED)
Comparison: Chest x-ray from same day.

CLINICAL DATA: Headache, sore throat, body aches, and productive
cough for the past week.

EXAM:
CT ANGIOGRAPHY CHEST WITH CONTRAST
TECHNIQUE: Multidetector CT imaging of the chest was performed using the
standard protocol during bolus administration of intravenous
contrast. Multiplanar CT image reconstructions and MIPs were
obtained to evaluate the vascular anatomy.
CONTRAST:  75mL OMNIPAQUE IOHEXOL 350 MG/ML SOLN

[Series 7: thins · axial · 0.73mm/px · z∈[-259,+18]mm · 14 of 446 slices shown]
[im 25/446  lung]
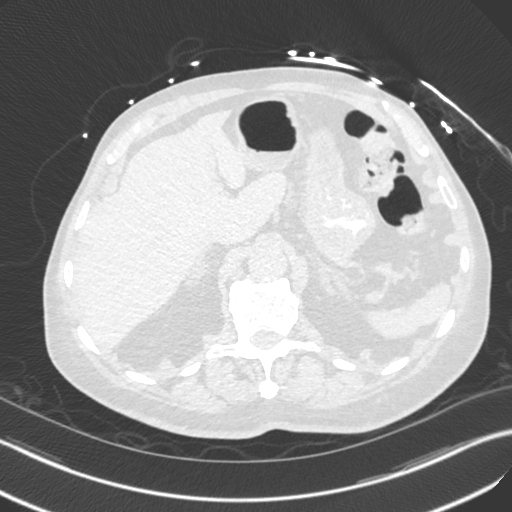
[im 50/446  soft-tissue]
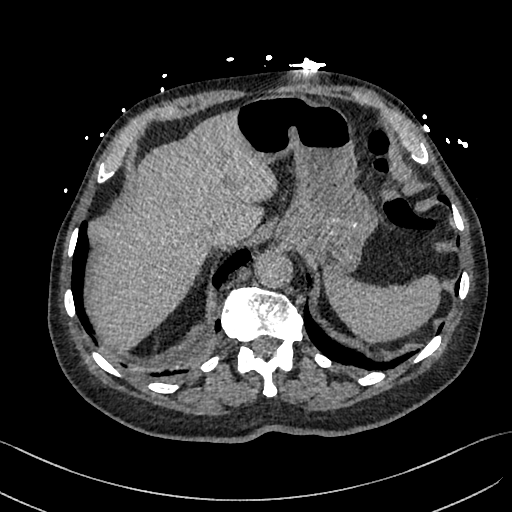
[im 99/446  lung]
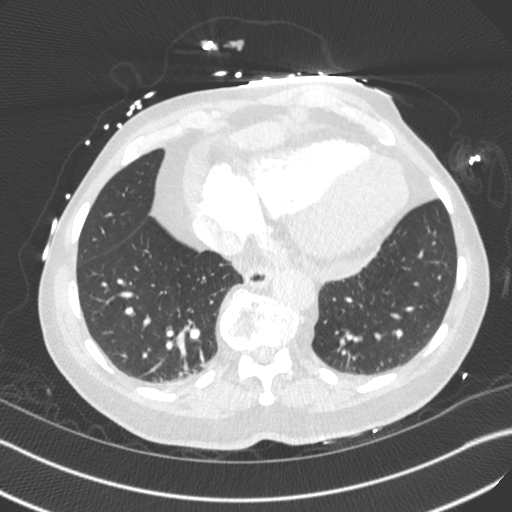
[im 124/446  soft-tissue]
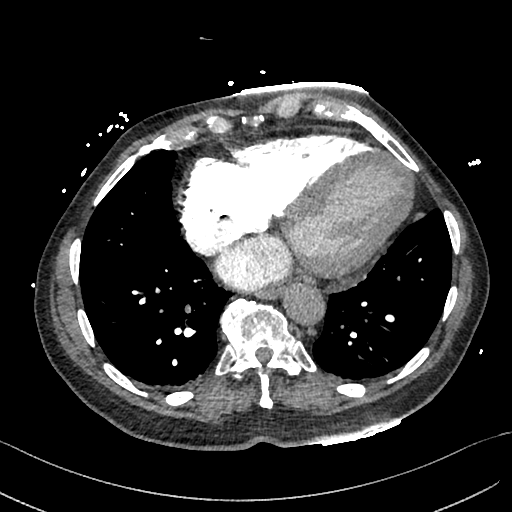
[im 149/446  lung]
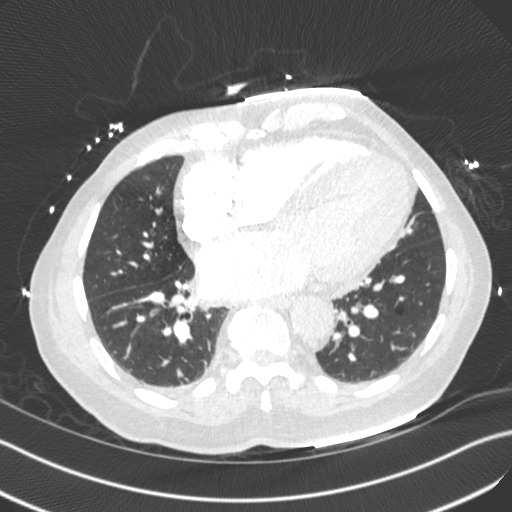
[im 174/446  soft-tissue]
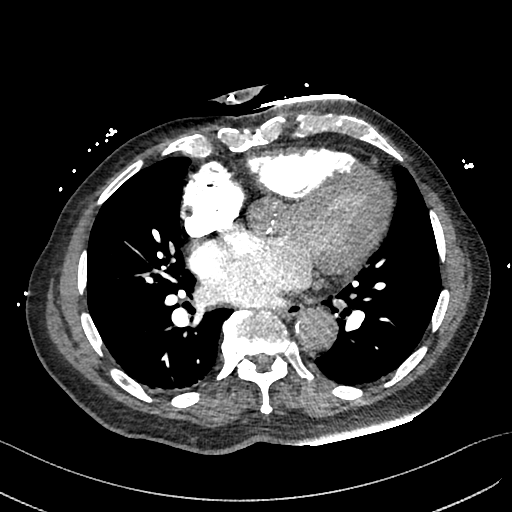
[im 198/446  lung]
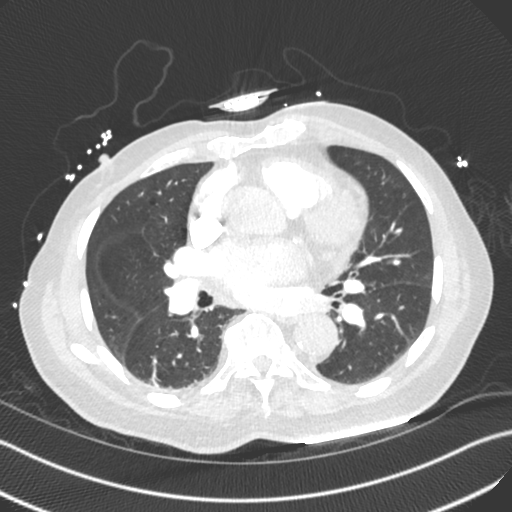
[im 248/446  soft-tissue]
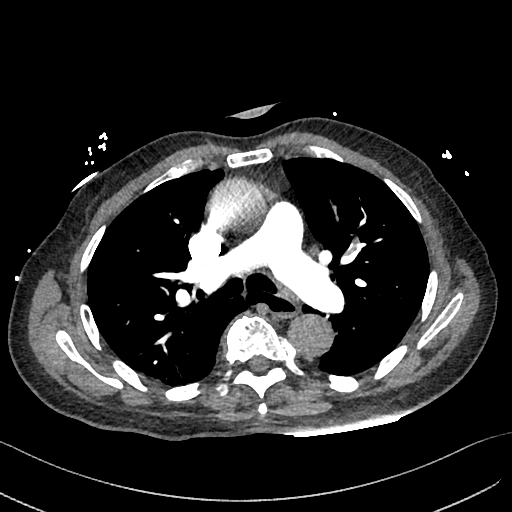
[im 272/446  lung]
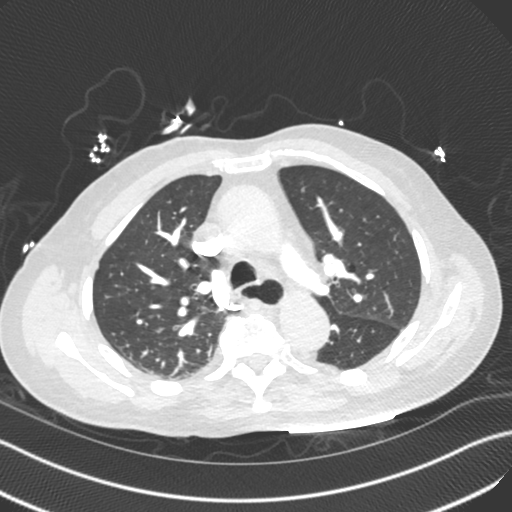
[im 297/446  soft-tissue]
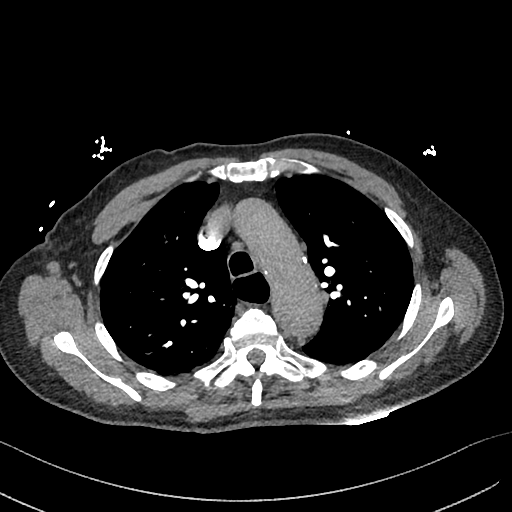
[im 322/446  lung]
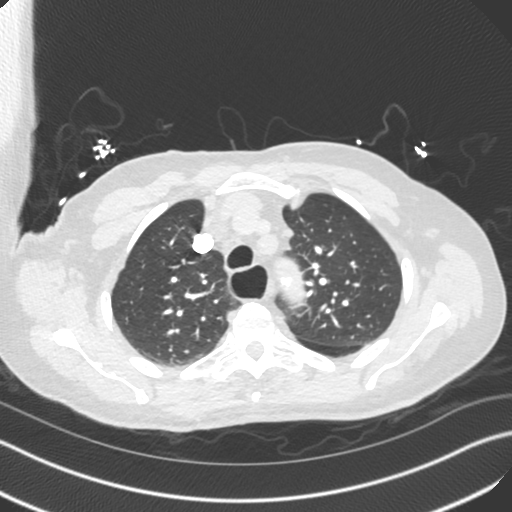
[im 347/446  soft-tissue]
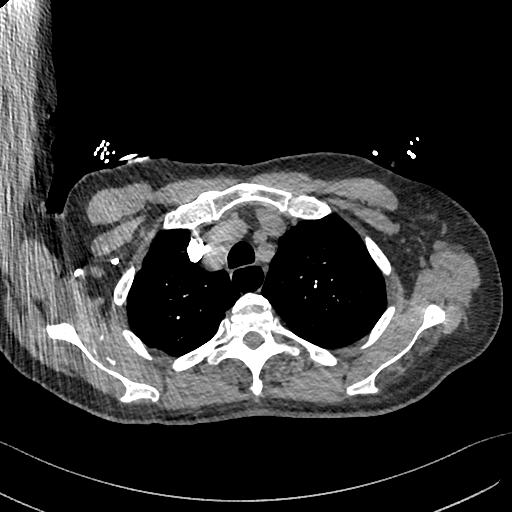
[im 396/446  lung]
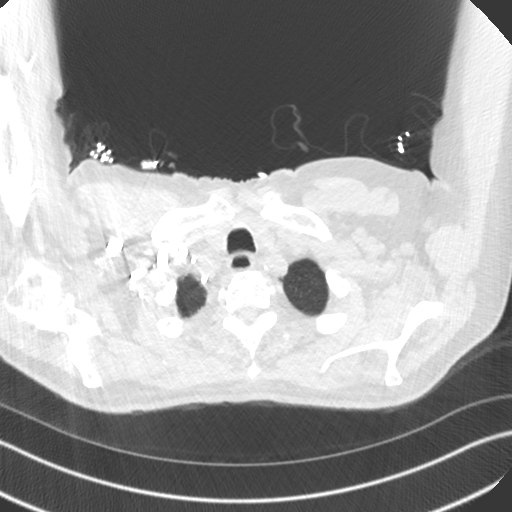
[im 421/446  soft-tissue]
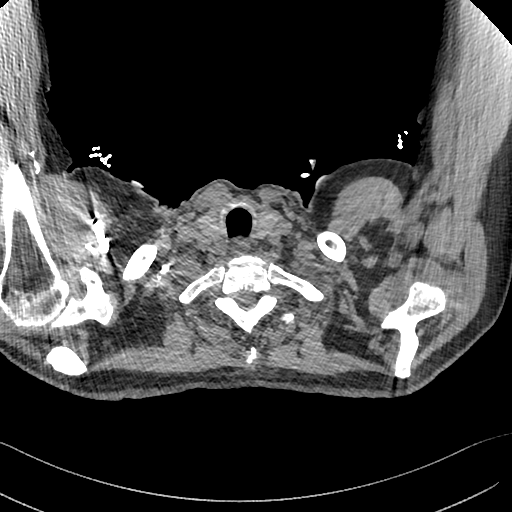

[Series 8: cor · coronal · 0.62mm/px · 3 of 156 slices shown]
[im 39/156  soft-tissue]
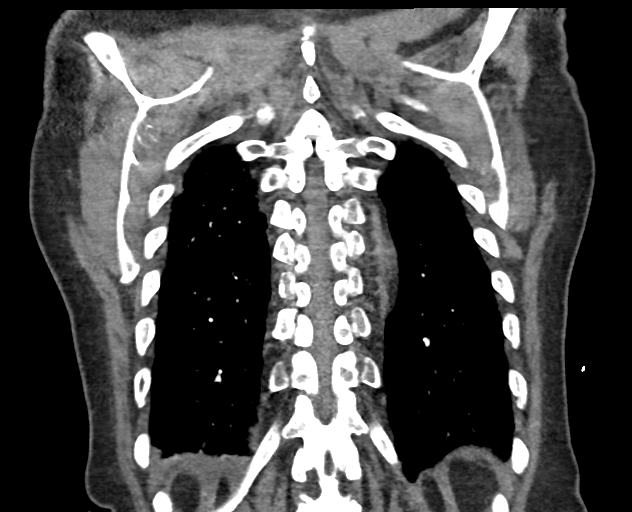
[im 78/156  soft-tissue]
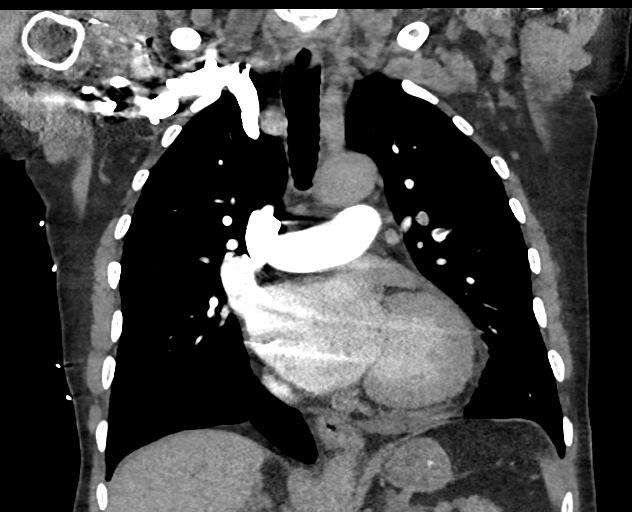
[im 117/156  soft-tissue]
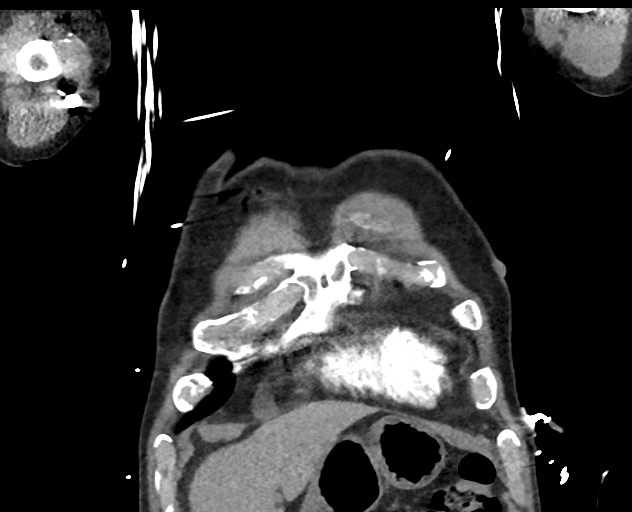

[17 of 46 positions shown; findings below may reference images not displayed]

FINDINGS: Cardiovascular: Satisfactory opacification of the pulmonary arteries
to the segmental level. Acute lobar and segmental pulmonary emboli
primarily involving the left upper lobe with minimal clot extending
into the proximal left interlobar pulmonary artery and left lower
lobe superior segmental pulmonary artery. Additional segmental and
subsegmental pulmonary emboli in the right upper lobe anterior
segment. Overall clot burden is small. Mild cardiomegaly with normal
RV/LV ratio. Trace pericardial effusion. Aneurysmal ascending
thoracic aorta measuring up to 4.1 cm. Coronary, aortic arch, and
branch vessel atherosclerotic vascular disease.

Mediastinum/Nodes: No enlarged mediastinal, hilar, or axillary lymph
nodes. Mildly patulous esophagus. Thyroid gland and trachea
demonstrate no significant findings.

Lungs/Pleura: Minimal dependent subsegmental atelectasis in both
lower lobes. Mild diffuse central peribronchial thickening. No focal
consolidation, pleural effusion, or pneumothorax. No suspicious
pulmonary nodule.

Upper Abdomen: No acute abnormality.

Musculoskeletal: No chest wall abnormality. No acute or significant
osseous findings.

Review of the MIP images confirms the above findings.
IMPRESSION: 1. Acute bilateral pulmonary emboli primarily involving both upper
lobes. Overall clot burden is small. No right heart strain.
2. Ascending thoracic aortic aneurysm measuring up to 4.1 cm.
Recommend annual imaging followup by CTA or MRA. This recommendation
follows 3323 ACCF/AHA/AATS/ACR/ASA/SCA/JAKUBEK/MARVINN/KHACHATRYAN/GIANCARLOS Guidelines
for the Diagnosis and Management of Patients with Thoracic Aortic
Disease. Circulation. 3323; 121: E266-e369. Aortic aneurysm NOS
(KJYP0-MPY.7)
3.  Aortic atherosclerosis (KJYP0-0ZS.S).

Critical Value/emergent results were called by telephone at the time
of interpretation on 02/09/2019 at [DATE] to Hrolfur
JOSHJAX, who verbally acknowledged these results.

## 2020-06-28 IMAGING — CR DG ABDOMEN ACUTE W/ 1V CHEST
3 series · 3 of 3 positions shown · non-contrast
Comparison: Chest 02/09/2019

CLINICAL DATA: Abdominal pain

EXAM:
DG ABDOMEN ACUTE W/ 1V CHEST

[chest pa]
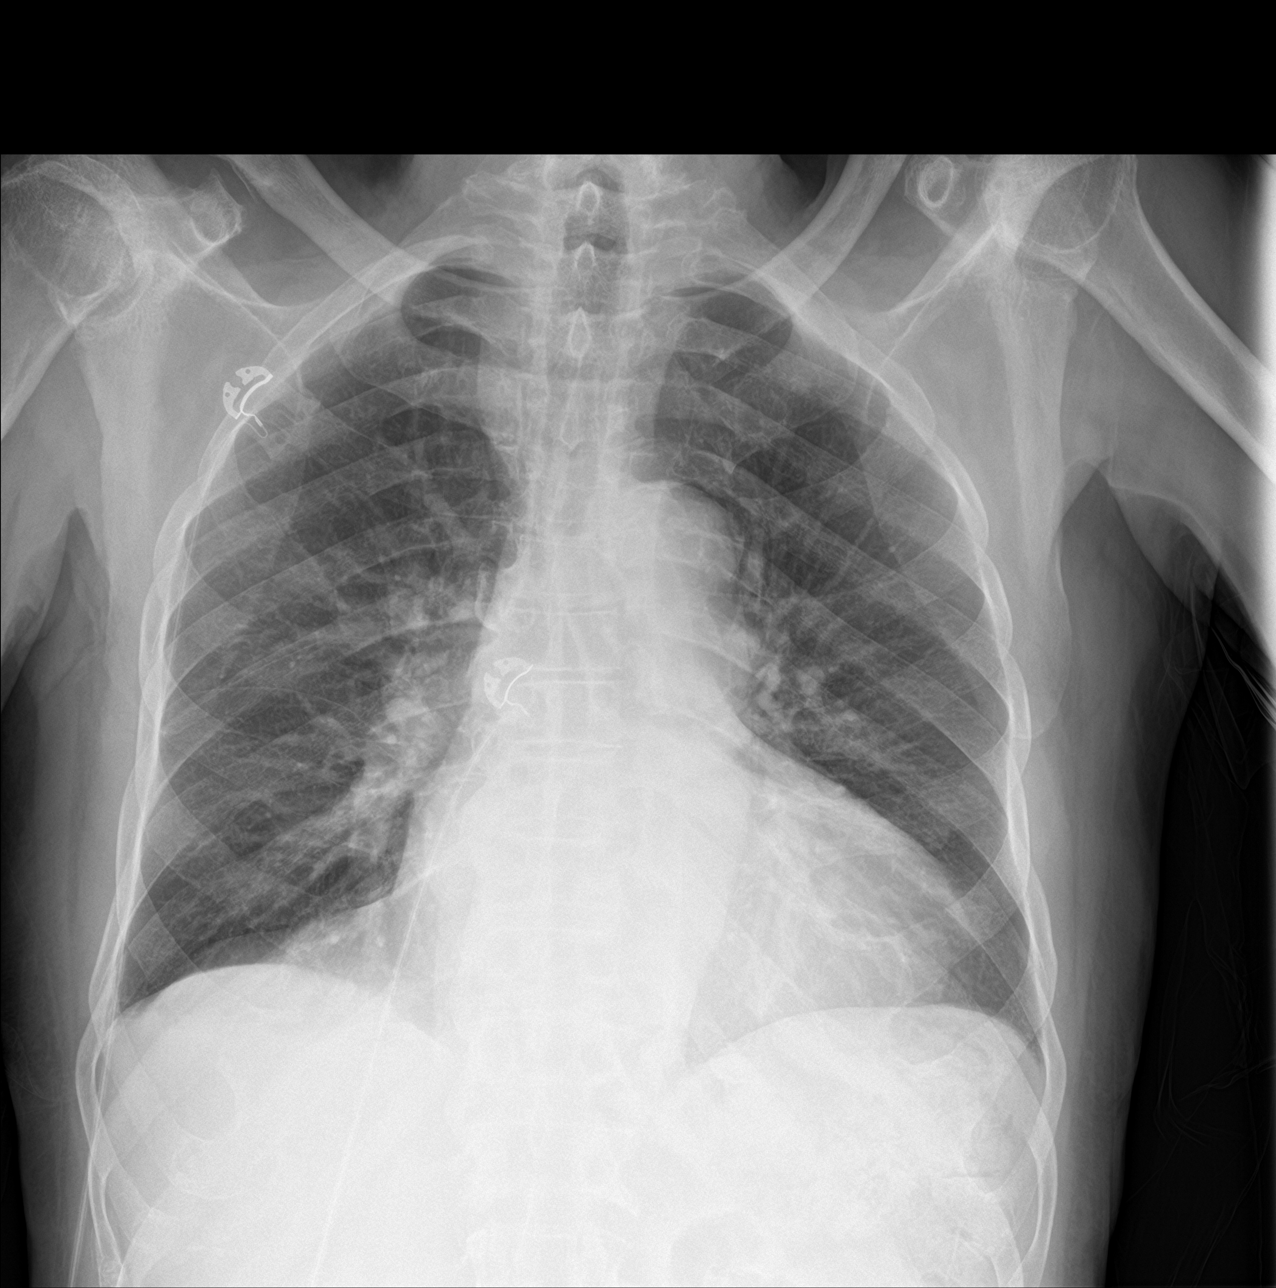

[abdomen erect]
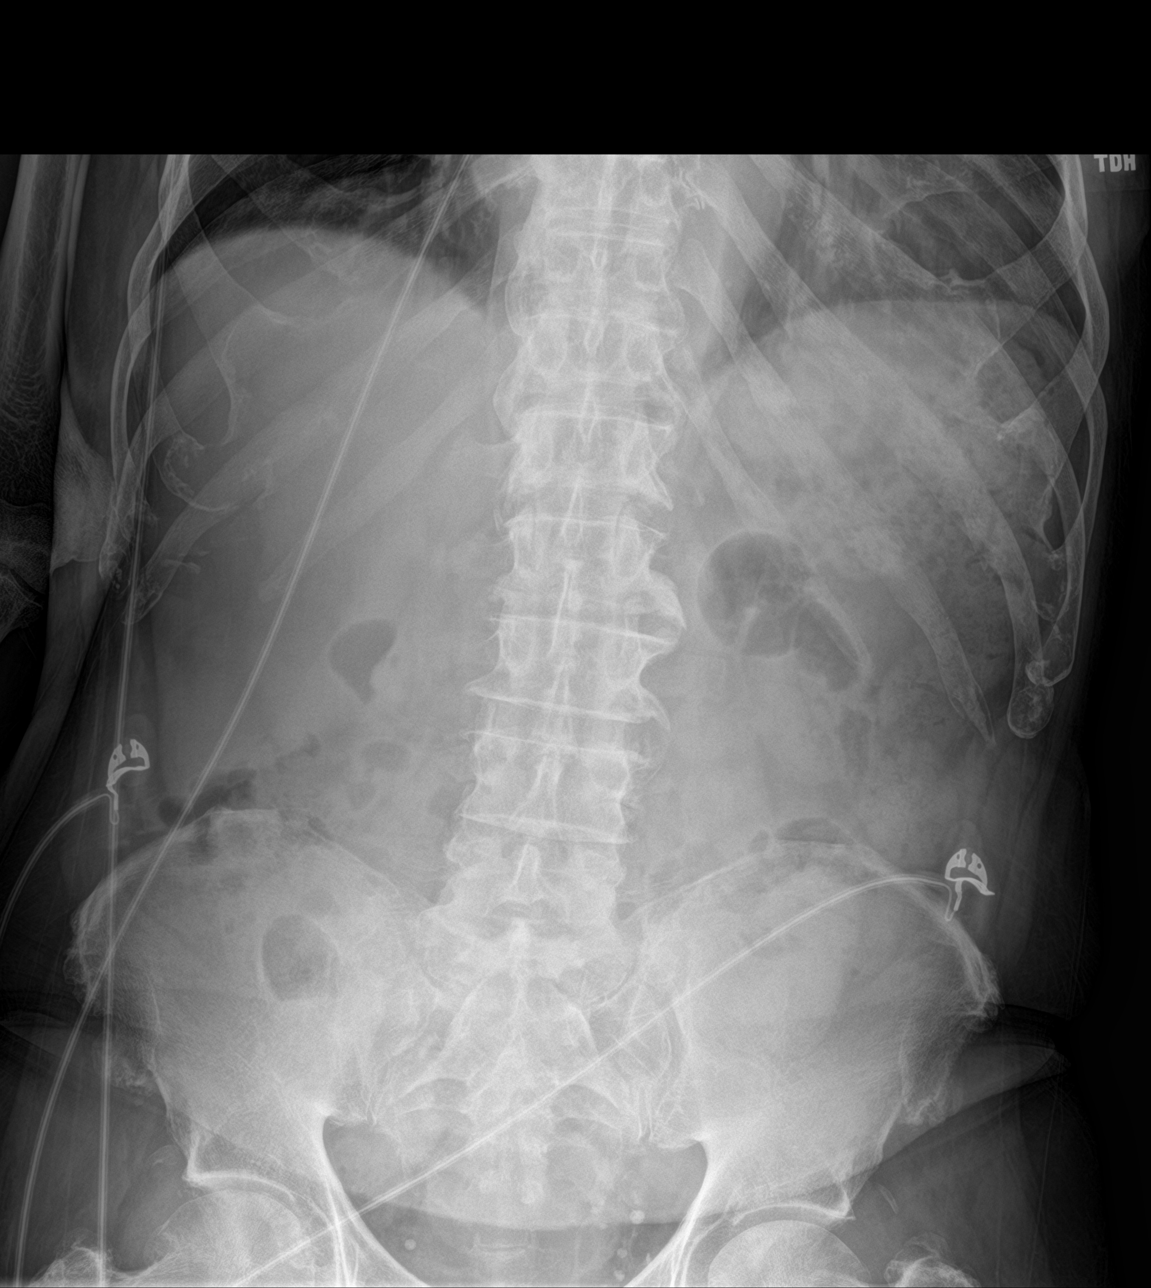

[abdomen supine]
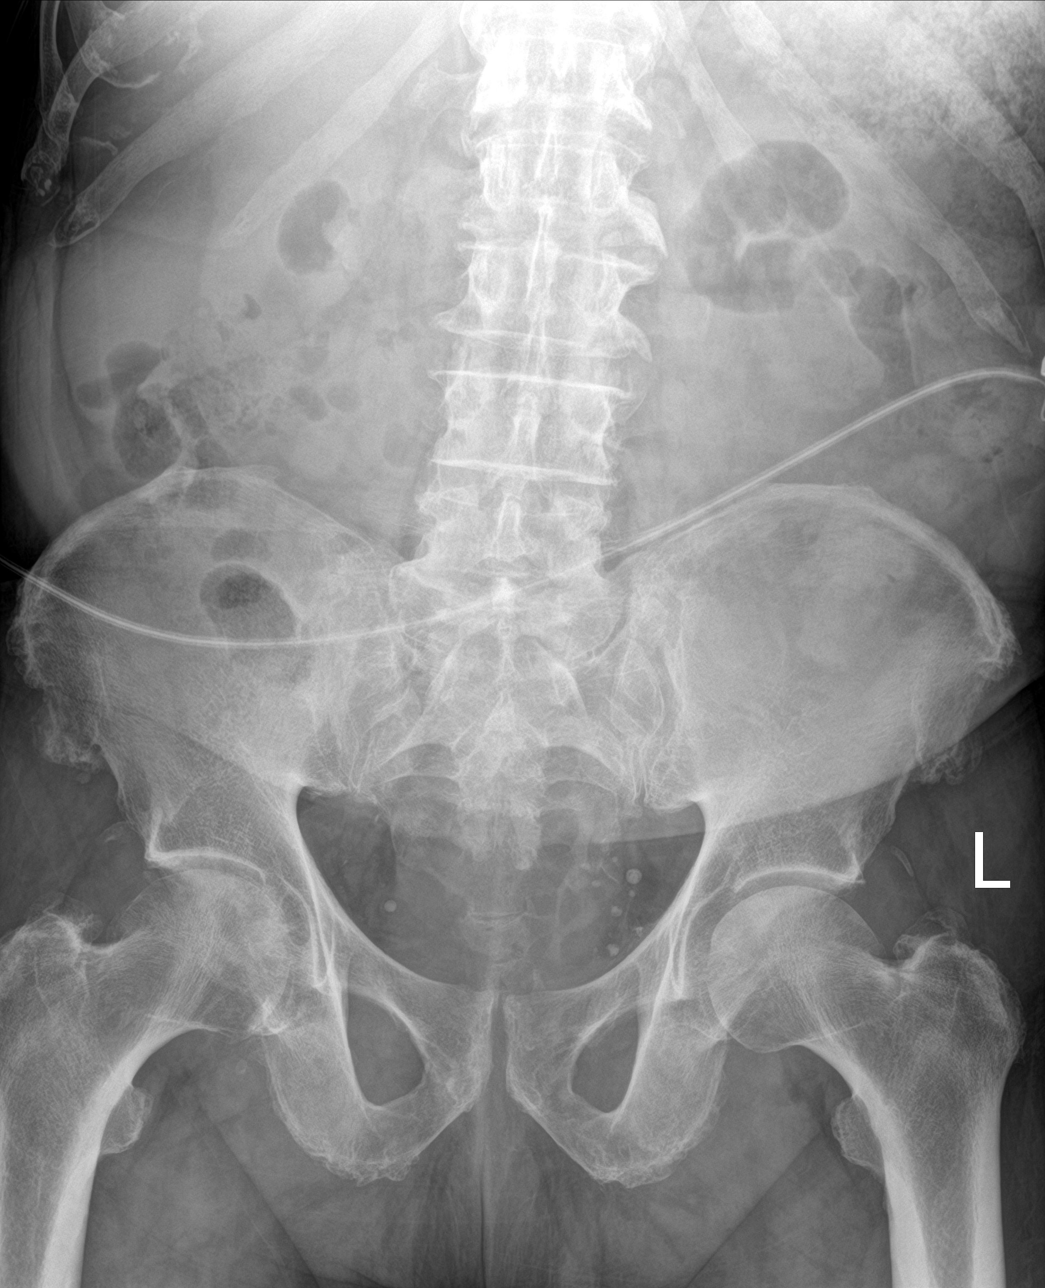

[3 of 3 positions shown; findings below may reference images not displayed]

FINDINGS: Cardiac enlargement without heart failure. Lungs are clear without
infiltrate or effusion

Normal bowel gas pattern. No obstruction or free air. No urinary
tract calculi. Mild degenerative changes lumbar spine.
IMPRESSION: Negative abdominal radiographs.  No acute cardiopulmonary disease.

## 2020-08-29 ENCOUNTER — Ambulatory Visit: Payer: Medicare Other | Admitting: Family Medicine

## 2020-09-07 ENCOUNTER — Other Ambulatory Visit: Payer: Self-pay | Admitting: Family Medicine

## 2020-09-07 DIAGNOSIS — I10 Essential (primary) hypertension: Secondary | ICD-10-CM

## 2020-09-07 NOTE — Telephone Encounter (Signed)
Requested medications are due for refill today.  yes  Requested medications are on the active medications list.  yes  Last refill. 03/01/2020  Future visit scheduled.   yes  Notes to clinic.  Prescription is expired.

## 2020-09-20 ENCOUNTER — Encounter: Payer: Self-pay | Admitting: Physician Assistant

## 2020-09-20 ENCOUNTER — Other Ambulatory Visit: Payer: Self-pay | Admitting: Physician Assistant

## 2020-09-20 ENCOUNTER — Ambulatory Visit: Payer: Medicare Other | Attending: Family Medicine | Admitting: Physician Assistant

## 2020-09-20 ENCOUNTER — Other Ambulatory Visit: Payer: Self-pay

## 2020-09-20 DIAGNOSIS — I10 Essential (primary) hypertension: Secondary | ICD-10-CM | POA: Diagnosis not present

## 2020-09-20 DIAGNOSIS — R3911 Hesitancy of micturition: Secondary | ICD-10-CM | POA: Diagnosis not present

## 2020-09-20 DIAGNOSIS — I480 Paroxysmal atrial fibrillation: Secondary | ICD-10-CM | POA: Diagnosis not present

## 2020-09-20 DIAGNOSIS — N401 Enlarged prostate with lower urinary tract symptoms: Secondary | ICD-10-CM

## 2020-09-20 MED ORDER — TAMSULOSIN HCL 0.4 MG PO CAPS: ORAL_CAPSULE | ORAL | 1 refills | Status: DC

## 2020-09-20 MED ORDER — LISINOPRIL 20 MG PO TABS: 20.0000 mg | ORAL_TABLET | Freq: Every day | ORAL | 3 refills | Status: DC

## 2020-09-20 MED ORDER — ELIQUIS 5 MG PO TABS: ORAL_TABLET | ORAL | 1 refills | Status: DC

## 2020-09-20 MED ORDER — ISOSORBIDE MONONITRATE ER 60 MG PO TB24: ORAL_TABLET | ORAL | 3 refills | Status: DC

## 2020-09-20 MED ORDER — AMLODIPINE BESYLATE 10 MG PO TABS: ORAL_TABLET | ORAL | 1 refills | Status: DC

## 2020-09-20 NOTE — Progress Notes (Signed)
Jesus Morrison, is a 87 y.o. male  IHK:[id number]  VFI:[id number]  DOB - October 25, 1926  Subjective:  Chief Complaint and HPI: Jesus Morrison is a 87 y.o. male here today for med RF.  No issues or concerns.  No CP/SOB.  No recent falls.  Compliant with meds but did not take BP meds this morning.    ROS:   Constitutional:  No f/c, No night sweats, No unexplained weight loss. EENT:  No new vision changes, No new blurry vision, No new hearing changes. No mouth, throat, or ear problems.  Respiratory: No cough, No SOB Cardiac: No CP, no palpitations GI:  No abd pain, No N/V/D. GU: No Urinary s/sx Musculoskeletal: No joint pain Neuro: No headache, no dizziness, no motor weakness.  Skin: No rash Endocrine:  No polydipsia. No polyuria.  Psych: Denies SI/HI  No problems updated.  ALLERGIES: No Known Allergies  PAST MEDICAL HISTORY: Past Medical History:  Diagnosis Date  . Arthritis    fingers and hands  . BPH (benign prostatic hypertrophy)    problems with acute urinary retention- has indwelling foley catheter  . Cold (disease)    last week ( week of Dec 22- Dec 29 ) - no longer coughing, no fever and feels better now  . Enlarged prostate   . Hypertension     MEDICATIONS AT HOME: Prior to Admission medications   Medication Sig Start Date End Date Taking? Authorizing Provider  amLODipine (NORVASC) 10 MG tablet TAKE 1 TABLET(10 MG) BY MOUTH DAILY 09/20/20   Sharon Seller, Marzella Schlein, PA-C  apixaban (ELIQUIS) 5 MG TABS tablet TAKE 1 TABLET(5 MG) BY MOUTH TWICE DAILY 09/20/20   Anders Simmonds, PA-C  cetirizine (ZYRTEC) 10 MG tablet Take 1 tablet (10 mg total) by mouth daily. 02/12/18   Hoy Register, MD  docusate sodium (COLACE) 100 MG capsule Take 1 capsule (100 mg total) by mouth every 12 (twelve) hours. 03/08/19   Ward, Layla Maw, DO  hydrocortisone cream 0.5 % Apply 1 application topically 2 (two) times daily. 05/16/17   Hoy Register, MD  isosorbide mononitrate (IMDUR) 60 MG 24 hr tablet  TAKE 1 TABLET(60 MG) BY MOUTH DAILY 09/20/20   Georgian Co M, PA-C  lisinopril (ZESTRIL) 20 MG tablet Take 1 tablet (20 mg total) by mouth daily. 09/20/20 12/19/20  Anders Simmonds, PA-C  polyethylene glycol (MIRALAX / GLYCOLAX) 17 g packet Take 17 g by mouth daily. Please stop if you have diarrhea. 06/09/19   Hoy Register, MD  tamsulosin (FLOMAX) 0.4 MG CAPS capsule TAKE 1 CAPSULE(0.4 MG) BY MOUTH DAILY 09/20/20   Anders Simmonds, PA-C  triamcinolone cream (KENALOG) 0.1 % Apply 1 application topically 2 (two) times daily. 05/20/18   Hoy Register, MD     Objective:  EXAM:   Vitals:   09/20/20 0921  BP: (!) 142/72  Pulse: (!) 56  Resp: 16  SpO2: 98%  Weight: 172 lb 3.2 oz (78.1 kg)    General appearance : A&OX3. NAD. Non-toxic-appearing HEENT: Atraumatic and Normocephalic.  PERRLA. EOM intact.   Chest/Lungs:  Breathing-non-labored, Good air entry bilaterally, breath sounds normal without rales, rhonchi, or wheezing  CVS: S1 S2 regular, no murmurs, gallops, rubs.+ bradycardia Extremities: Bilateral Lower Ext shows no edema, both legs are warm to touch with = pulse throughout Neurology:  CN II-XII grossly intact, Non focal.   Psych:  TP linear. J/I WNL. Normal speech. Appropriate eye contact and affect.  Skin:  No Rash  Data Review No results found  for: HGBA1C   Assessment & Plan   1. Essential hypertension He did not take amlodipine today - amLODipine (NORVASC) 10 MG tablet; TAKE 1 TABLET(10 MG) BY MOUTH DAILY  Dispense: 90 tablet; Refill: 1 - lisinopril (ZESTRIL) 20 MG tablet; Take 1 tablet (20 mg total) by mouth daily.  Dispense: 90 tablet; Refill: 3 - isosorbide mononitrate (IMDUR) 60 MG 24 hr tablet; TAKE 1 TABLET(60 MG) BY MOUTH DAILY  Dispense: 30 tablet; Refill: 3 - Comprehensive metabolic panel - CBC with Differential/Platelet  2. Paroxysmal atrial fibrillation (HCC) - apixaban (ELIQUIS) 5 MG TABS tablet; TAKE 1 TABLET(5 MG) BY MOUTH TWICE DAILY  Dispense: 180  tablet; Refill: 1 - Comprehensive metabolic panel - CBC with Differential/Platelet  3. Benign prostatic hyperplasia with urinary hesitancy - tamsulosin (FLOMAX) 0.4 MG CAPS capsule; TAKE 1 CAPSULE(0.4 MG) BY MOUTH DAILY  Dispense: 90 capsule; Refill: 1 - CBC with Differential/Platelet   Patient have been counseled extensively about nutrition and exercise  Return in about 4 months (around 01/20/2021) for PCP;  chronic dz management.  The patient was given clear instructions to go to ER or return to medical center if symptoms don't improve, worsen or new problems develop. The patient verbalized understanding. The patient was told to call to get lab results if they haven't heard anything in the next week.     Georgian Co, PA-C [facility] and Wellness [place], [place] [phone]   09/20/2020, 10:07 AMPatient ID: Jesus Morrison, male   DOB: 1926/07/04, 87 y.o.   MRN: [id number]

## 2020-09-21 LAB — CBC WITH DIFFERENTIAL/PLATELET
Basophils Absolute: 0.1 10*3/uL (ref 0.0–0.2)
Basos: 1 %
EOS (ABSOLUTE): 0.2 10*3/uL (ref 0.0–0.4)
Eos: 3 %
Hematocrit: 43.5 % (ref 37.5–51.0)
Hemoglobin: 13.6 g/dL (ref 13.0–17.7)
Immature Grans (Abs): 0 10*3/uL (ref 0.0–0.1)
Immature Granulocytes: 0 %
Lymphocytes Absolute: 2.4 10*3/uL (ref 0.7–3.1)
Lymphs: 37 %
MCH: 28.2 pg (ref 26.6–33.0)
MCHC: 31.3 g/dL — ABNORMAL LOW (ref 31.5–35.7)
MCV: 90 fL (ref 79–97)
Monocytes Absolute: 0.6 10*3/uL (ref 0.1–0.9)
Monocytes: 10 %
Neutrophils Absolute: 3.1 10*3/uL (ref 1.4–7.0)
Neutrophils: 49 %
Platelets: 178 10*3/uL (ref 150–450)
RBC: 4.83 x10E6/uL (ref 4.14–5.80)
RDW: 12.1 % (ref 11.6–15.4)
WBC: 6.4 10*3/uL (ref 3.4–10.8)

## 2020-09-21 LAB — COMPREHENSIVE METABOLIC PANEL
ALT: 9 IU/L (ref 0–44)
AST: 13 IU/L (ref 0–40)
Albumin/Globulin Ratio: 1.8 (ref 1.2–2.2)
Albumin: 4.4 g/dL (ref 3.5–4.6)
Alkaline Phosphatase: 55 IU/L (ref 44–121)
BUN/Creatinine Ratio: 16 (ref 10–24)
BUN: 14 mg/dL (ref 10–36)
Bilirubin Total: 0.9 mg/dL (ref 0.0–1.2)
CO2: 24 mmol/L (ref 20–29)
Calcium: 9.4 mg/dL (ref 8.6–10.2)
Chloride: 104 mmol/L (ref 96–106)
Creatinine, Ser: 0.88 mg/dL (ref 0.76–1.27)
Globulin, Total: 2.5 g/dL (ref 1.5–4.5)
Glucose: 88 mg/dL (ref 65–99)
Potassium: 5 mmol/L (ref 3.5–5.2)
Sodium: 144 mmol/L (ref 134–144)
Total Protein: 6.9 g/dL (ref 6.0–8.5)
eGFR: 81 mL/min/{1.73_m2} (ref >59)

## 2020-09-25 ENCOUNTER — Telehealth: Payer: Self-pay | Admitting: *Deleted

## 2020-09-28 ENCOUNTER — Encounter: Payer: Self-pay | Admitting: *Deleted

## 2020-09-28 NOTE — Telephone Encounter (Signed)
Left voicemail. Will mail letter The results from your recent visit are back and your labs have been reviewed.   All lab results are normal or stable. Please view your MyChart or call our office if you have any questions or concerns.   Please don't hesitate to call.  Sincerely,  [employer] [facility] [employer]

## 2020-09-28 NOTE — Progress Notes (Signed)
The results from your recent visit are back and your labs have been reviewed.   All lab results are normal or stable. Please view your MyChart or call our office if you have any questions or concerns.   Please don't hesitate to call.  Sincerely,  [facility]  

## 2020-10-18 ENCOUNTER — Other Ambulatory Visit: Payer: Self-pay | Admitting: Family Medicine

## 2020-10-18 DIAGNOSIS — I10 Essential (primary) hypertension: Secondary | ICD-10-CM

## 2020-10-18 DIAGNOSIS — R3911 Hesitancy of micturition: Secondary | ICD-10-CM

## 2020-12-17 ENCOUNTER — Telehealth: Payer: Self-pay

## 2020-12-17 NOTE — Telephone Encounter (Signed)
Called pt to schedule AWV, home phone num disconnected [phone].

## 2021-01-30 ENCOUNTER — Ambulatory Visit: Payer: Medicare Other | Attending: Family Medicine | Admitting: Family Medicine

## 2021-01-30 ENCOUNTER — Encounter: Payer: Self-pay | Admitting: Family Medicine

## 2021-01-30 ENCOUNTER — Other Ambulatory Visit: Payer: Self-pay

## 2021-01-30 DIAGNOSIS — I480 Paroxysmal atrial fibrillation: Secondary | ICD-10-CM

## 2021-01-30 DIAGNOSIS — I10 Essential (primary) hypertension: Secondary | ICD-10-CM

## 2021-01-30 DIAGNOSIS — Z23 Encounter for immunization: Secondary | ICD-10-CM | POA: Diagnosis not present

## 2021-01-30 MED ORDER — ISOSORBIDE MONONITRATE ER 60 MG PO TB24: ORAL_TABLET | ORAL | 1 refills | Status: DC

## 2021-01-30 MED ORDER — APIXABAN 5 MG PO TABS: ORAL_TABLET | ORAL | 1 refills | Status: DC

## 2021-01-30 MED ORDER — AMLODIPINE BESYLATE 10 MG PO TABS: ORAL_TABLET | ORAL | 1 refills | Status: DC

## 2021-01-30 NOTE — Addendum Note (Signed)
Addended by: Ronette Deter on: 01/30/2021 03:26 PM   Modules accepted: Orders

## 2021-01-30 NOTE — Progress Notes (Signed)
Subjective:  Patient ID: Jesus Morrison, male    DOB: December 12, 1926  Age: 87 y.o. MRN: [id number]  CC: Hypertension   HPI Jesus Morrison is a 87 y.o. year old male with a history of hypertension, history of BPH status post retropubic prostatectomy in 04/2012, pulmonary embolism diagnosed in 02/2019, paroxysmal A. fib, V. tach in the setting of acute pulmonary thromboembolism who consents today for chronic disease management.  Interval History: He forgot to take his medications today and his BP is elevated.  BP was 142/72 at his last visit 4 months ago.  He last saw Cardiology in 10/2019. He has no chest pain or dyspnea. Compliant with Eliquis with no complaints of bruising, bleeding or blood in his stools. He has no concerns today. Past Medical History:  Diagnosis Date   Arthritis    fingers and hands   BPH (benign prostatic hypertrophy)    problems with acute urinary retention- has indwelling foley catheter   Cold (disease)    last week ( week of Dec 22- Dec 29 ) - no longer coughing, no fever and feels better now   Enlarged prostate    Hypertension     Past Surgical History:  Procedure Laterality Date    Right elbow surgery as a child     PROSTATECTOMY  04/17/2012   Procedure: PROSTATECTOMY RETROPUBIC;  Surgeon: Lindaann Slough, MD;  Location: [facility];  Service: Urology;  Laterality: N/A;  Simple Retropubic Prostatectomy      Family History  Problem Relation Age of Onset   Liver disease Father    Heart disease Brother     No Known Allergies  Outpatient Medications Prior to Visit  Medication Sig Dispense Refill   cetirizine (ZYRTEC) 10 MG tablet Take 1 tablet (10 mg total) by mouth daily. 30 tablet 1   hydrocortisone cream 0.5 % Apply 1 application topically 2 (two) times daily. 30 g 1   polyethylene glycol (MIRALAX / GLYCOLAX) 17 g packet Take 17 g by mouth daily. Please stop if you have diarrhea. 30 each 3   tamsulosin (FLOMAX) 0.4 MG CAPS capsule TAKE 1 CAPSULE(0.4 MG) BY  MOUTH DAILY 90 capsule 1   triamcinolone cream (KENALOG) 0.1 % Apply 1 application topically 2 (two) times daily. 30 g 1   amLODipine (NORVASC) 10 MG tablet TAKE 1 TABLET(10 MG) BY MOUTH DAILY 90 tablet 1   apixaban (ELIQUIS) 5 MG TABS tablet TAKE 1 TABLET(5 MG) BY MOUTH TWICE DAILY 180 tablet 1   docusate sodium (COLACE) 100 MG capsule Take 1 capsule (100 mg total) by mouth every 12 (twelve) hours. 60 capsule 0   isosorbide mononitrate (IMDUR) 60 MG 24 hr tablet TAKE 1 TABLET(60 MG) BY MOUTH DAILY 30 tablet 3   lisinopril (ZESTRIL) 20 MG tablet Take 1 tablet (20 mg total) by mouth daily. 90 tablet 3   No facility-administered medications prior to visit.     ROS Review of Systems  Constitutional:  Negative for activity change and appetite change.  HENT:  Negative for sinus pressure and sore throat.   Eyes:  Negative for visual disturbance.  Respiratory:  Negative for cough, chest tightness and shortness of breath.   Cardiovascular:  Negative for chest pain and leg swelling.  Gastrointestinal:  Negative for abdominal distention, abdominal pain, constipation and diarrhea.  Endocrine: Negative.   Genitourinary:  Negative for dysuria.  Musculoskeletal:  Negative for joint swelling and myalgias.  Skin:  Negative for rash.  Allergic/Immunologic: Negative.   Neurological:  Negative  for weakness, light-headedness and numbness.  Psychiatric/Behavioral:  Negative for dysphoric mood and suicidal ideas.    Objective:  BP (!) 188/64   Pulse 60   Ht 5\' 5"  (1.651 m)   Wt 172 lb (78 kg)   SpO2 100%   BMI 28.62 kg/m   BP/Weight 01/30/2021 09/20/2020 06/01/2020  Systolic BP 188 142 165  Diastolic BP 64 72 63  Wt. (Lbs) 172 172.2 175  BMI 28.62 28.66 29.12      Physical Exam Constitutional:      Appearance: He is well-developed.  Cardiovascular:     Rate and Rhythm: Normal rate.     Heart sounds: Normal heart sounds. No murmur heard. Pulmonary:     Effort: Pulmonary effort is normal.      Breath sounds: Normal breath sounds. No wheezing or rales.  Chest:     Chest wall: No tenderness.  Abdominal:     General: Bowel sounds are normal. There is no distension.     Palpations: Abdomen is soft. There is no mass.     Tenderness: There is no abdominal tenderness.  Musculoskeletal:        General: Normal range of motion.     Right lower leg: No edema.     Left lower leg: No edema.  Neurological:     Mental Status: He is alert and oriented to person, place, and time.  Psychiatric:        Mood and Affect: Mood normal.    CMP Latest Ref Rng & Units 09/20/2020 03/01/2020 10/25/2019  Glucose 65 - 99 mg/dL 88 81 75  BUN 10 - 36 mg/dL 14 17 17   Creatinine 0.76 - 1.27 mg/dL 12/26/2019 7.82  Sodium 134 - 144 mmol/L 144 146(H) 143  Potassium 3.5 - 5.2 mmol/L 5.0 4.3 5.0  Chloride 96 - 106 mmol/L 104 106 106  CO2 20 - 29 mmol/L 24 25 26   Calcium 8.6 - 10.2 mg/dL 9.4 8.9 9.3  Total Protein 6.0 - 8.5 g/dL 6.9 - -  Total Bilirubin 0.0 - 1.2 mg/dL 0.9 - -  Alkaline Phos 44 - 121 IU/L 55 - -  AST 0 - 40 IU/L 13 - -  ALT 0 - 44 IU/L 9 - -    Lipid Panel     Component Value Date/Time   CHOL 186 04/28/2019 0941   TRIG 46 04/28/2019 0941   HDL 63 04/28/2019 0941   CHOLHDL 3.0 04/28/2019 0941   CHOLHDL 2.6 10/27/2015 0943   VLDL 10 10/27/2015 0943   LDLCALC 114 (H) 04/28/2019 0941    CBC    Component Value Date/Time   WBC 6.4 09/20/2020 1014   WBC 7.9 03/08/2019 0537   RBC 4.83 09/20/2020 1014   RBC 4.71 03/08/2019 0537   HGB 13.6 09/20/2020 1014   HCT 43.5 09/20/2020 1014   PLT 178 09/20/2020 1014   MCV 90 09/20/2020 1014   MCH 28.2 09/20/2020 1014   MCH 28.9 03/08/2019 0537   MCHC 31.3 (L) 09/20/2020 1014   MCHC 31.9 03/08/2019 0537   RDW 12.1 09/20/2020 1014   LYMPHSABS 2.4 09/20/2020 1014   MONOABS 0.9 03/08/2019 0537   EOSABS 0.2 09/20/2020 1014   BASOSABS 0.1 09/20/2020 1014    No results found for: HGBA1C  Assessment & Plan:  1. Essential  hypertension Uncontrolled due to the fact that he is yet to take his antihypertensives Advised to use timers to assist in compliance Counseled on blood pressure goal of less than  130/80, low-sodium, DASH diet, medication compliance, 150 minutes of moderate intensity exercise per week. Discussed medication compliance, adverse effects. - amLODipine (NORVASC) 10 MG tablet; TAKE 1 TABLET(10 MG) BY MOUTH DAILY  Dispense: 90 tablet; Refill: 1 - isosorbide mononitrate (IMDUR) 60 MG 24 hr tablet; TAKE 1 TABLET(60 MG) BY MOUTH DAILY  Dispense: 90 tablet; Refill: 1  2. Paroxysmal atrial fibrillation (HCC) Currently in sinus rhythm Continue anticoagulation with Eliquis - apixaban (ELIQUIS) 5 MG TABS tablet; TAKE 1 TABLET(5 MG) BY MOUTH TWICE DAILY  Dispense: 180 tablet; Refill: 1   Meds ordered this encounter  Medications   amLODipine (NORVASC) 10 MG tablet    Sig: TAKE 1 TABLET(10 MG) BY MOUTH DAILY    Dispense:  90 tablet    Refill:  1    **Patient requests 90 days supply**   apixaban (ELIQUIS) 5 MG TABS tablet    Sig: TAKE 1 TABLET(5 MG) BY MOUTH TWICE DAILY    Dispense:  180 tablet    Refill:  1   isosorbide mononitrate (IMDUR) 60 MG 24 hr tablet    Sig: TAKE 1 TABLET(60 MG) BY MOUTH DAILY    Dispense:  90 tablet    Refill:  1    Follow-up: Return in about 3 months (around 05/02/2021) for Medical conditions.       Hoy Register, MD, FAAFP. [facility] and Wellness [place], [place] [phone]   01/30/2021, 2:48 PM

## 2021-02-11 ENCOUNTER — Other Ambulatory Visit: Payer: Self-pay | Admitting: Physician Assistant

## 2021-02-11 DIAGNOSIS — I10 Essential (primary) hypertension: Secondary | ICD-10-CM

## 2021-02-11 NOTE — Telephone Encounter (Signed)
last RF 01/30/21 #90 1 RF

## 2021-04-07 ENCOUNTER — Other Ambulatory Visit: Payer: Self-pay | Admitting: Family Medicine

## 2021-04-07 DIAGNOSIS — I10 Essential (primary) hypertension: Secondary | ICD-10-CM

## 2021-04-07 NOTE — Telephone Encounter (Signed)
Requested medication (s) are due for refill today: yes  Requested medication (s) are on the active medication list: yes but expired end date  Last refill:  09/30/20  RX expired 12/29/20  Future visit scheduled: yes  Notes to clinic:  expired medication/ overdue lab work   Requested Prescriptions  Pending Prescriptions Disp Refills   lisinopril (ZESTRIL) 20 MG tablet [Pharmacy Med Name: LISINOPRIL 20MG  TABLETS] 90 tablet 3    Sig: TAKE 1 TABLET(20 MG) BY MOUTH DAILY     Cardiovascular:  ACE Inhibitors Failed - 04/07/2021  4:51 PM      Failed - Cr in normal range and within 180 days    Creat  Date Value Ref Range Status  10/27/2015 0.90 0.70 - 1.11 mg/dL Final    Comment:      For patients > or = 85 years of age: The upper reference limit for Creatinine is approximately 13% higher for people identified as African-American.      Creatinine, Ser  Date Value Ref Range Status  09/20/2020 0.88 0.76 - 1.27 mg/dL Final          Failed - K in normal range and within 180 days    Potassium  Date Value Ref Range Status  09/20/2020 5.0 3.5 - 5.2 mmol/L Final          Failed - Last BP in normal range    BP Readings from Last 1 Encounters:  01/30/21 (!) 188/64          Passed - Patient is not pregnant      Passed - Valid encounter within last 6 months    Recent Outpatient Visits           2 months ago Essential hypertension   [facility] [place], [place], [place]   6 months ago Essential hypertension   [facility] [place], [place], [place]   10 months ago Paroxysmal atrial fibrillation [facility])   [facility], [place]   1 year ago Need for immunization against influenza   [facility], [place]   1 year ago Bradycardia   [facility], [place]       Future Appointments             In 3 weeks Hoy Register, MD [facility]

## 2021-05-02 ENCOUNTER — Other Ambulatory Visit: Payer: Self-pay

## 2021-05-02 ENCOUNTER — Ambulatory Visit: Payer: Medicare Other | Attending: Family Medicine | Admitting: Family Medicine

## 2021-05-02 ENCOUNTER — Encounter: Payer: Self-pay | Admitting: Family Medicine

## 2021-05-02 ENCOUNTER — Ambulatory Visit: Payer: Medicare Other | Admitting: Family Medicine

## 2021-05-02 DIAGNOSIS — N401 Enlarged prostate with lower urinary tract symptoms: Secondary | ICD-10-CM

## 2021-05-02 DIAGNOSIS — I10 Essential (primary) hypertension: Secondary | ICD-10-CM

## 2021-05-02 DIAGNOSIS — R3911 Hesitancy of micturition: Secondary | ICD-10-CM

## 2021-05-02 MED ORDER — TAMSULOSIN HCL 0.4 MG PO CAPS: ORAL_CAPSULE | ORAL | 1 refills | Status: DC

## 2021-05-02 NOTE — Progress Notes (Signed)
Subjective:  Patient ID: Jesus Morrison, male    DOB: 23-Apr-1926  Age: 88 y.o. MRN: [id number]  CC: Hypertension   HPI Jesus Morrison is a 88 y.o. year old male with a history of hypertension, history of BPH status post retropubic prostatectomy in 04/2012, pulmonary embolism diagnosed in 02/2019, paroxysmal A. fib, V. tach in the setting of acute pulmonary thromboembolism who consents today for chronic disease management.  Interval History: BP is elevated and he is yet to take his medication.  He states he was in a hurry to come into the clinic and has not had breakfast either. He states prior to today he has been compliant with all his medications and informs me he has his medications in his pocket at the moment. He denies chest pain or dyspnea and has no additional concerns.  Past Medical History:  Diagnosis Date   Arthritis    fingers and hands   BPH (benign prostatic hypertrophy)    problems with acute urinary retention- has indwelling foley catheter   Cold (disease)    last week ( week of Dec 22- Dec 29 ) - no longer coughing, no fever and feels better now   Enlarged prostate    Hypertension     Past Surgical History:  Procedure Laterality Date    Right elbow surgery as a child     PROSTATECTOMY  04/17/2012   Procedure: PROSTATECTOMY RETROPUBIC;  Surgeon: Hanley Ben, MD;  Location: [facility];  Service: Urology;  Laterality: N/A;  Simple Retropubic Prostatectomy      Family History  Problem Relation Age of Onset   Liver disease Father    Heart disease Brother     No Known Allergies  Outpatient Medications Prior to Visit  Medication Sig Dispense Refill   amLODipine (NORVASC) 10 MG tablet TAKE 1 TABLET(10 MG) BY MOUTH DAILY 90 tablet 1   apixaban (ELIQUIS) 5 MG TABS tablet TAKE 1 TABLET(5 MG) BY MOUTH TWICE DAILY 180 tablet 1   cetirizine (ZYRTEC) 10 MG tablet Take 1 tablet (10 mg total) by mouth daily. 30 tablet 1   hydrocortisone cream 0.5 % Apply 1 application  topically 2 (two) times daily. 30 g 1   isosorbide mononitrate (IMDUR) 60 MG 24 hr tablet TAKE 1 TABLET(60 MG) BY MOUTH DAILY 90 tablet 1   lisinopril (ZESTRIL) 20 MG tablet TAKE 1 TABLET(20 MG) BY MOUTH DAILY 90 tablet 0   polyethylene glycol (MIRALAX / GLYCOLAX) 17 g packet Take 17 g by mouth daily. Please stop if you have diarrhea. 30 each 3   triamcinolone cream (KENALOG) 0.1 % Apply 1 application topically 2 (two) times daily. 30 g 1   tamsulosin (FLOMAX) 0.4 MG CAPS capsule TAKE 1 CAPSULE(0.4 MG) BY MOUTH DAILY 90 capsule 1   No facility-administered medications prior to visit.     ROS Review of Systems  Constitutional:  Negative for activity change and appetite change.  HENT:  Negative for sinus pressure and sore throat.   Eyes:  Negative for visual disturbance.  Respiratory:  Negative for cough, chest tightness and shortness of breath.   Cardiovascular:  Negative for chest pain and leg swelling.  Gastrointestinal:  Negative for abdominal distention, abdominal pain, constipation and diarrhea.  Endocrine: Negative.   Genitourinary:  Negative for dysuria.  Musculoskeletal:  Negative for joint swelling and myalgias.  Skin:  Negative for rash.  Allergic/Immunologic: Negative.   Neurological:  Negative for weakness, light-headedness and numbness.  Psychiatric/Behavioral:  Negative for dysphoric mood and  suicidal ideas.    Objective:  BP (!) 166/64    Pulse (!) 50    Ht 5' 5"  (1.651 m)    Wt 170 lb 3.2 oz (77.2 kg)    BMI 28.32 kg/m   BP/Weight 05/02/2021 86/48/4720 10/15/1826  Systolic BP 833 744 514  Diastolic BP 64 64 72  Wt. (Lbs) 170.2 172 172.2  BMI 28.32 28.62 28.66      Physical Exam Constitutional:      Appearance: He is well-developed.  Cardiovascular:     Rate and Rhythm: Bradycardia present.     Heart sounds: Normal heart sounds. No murmur heard. Pulmonary:     Effort: Pulmonary effort is normal.     Breath sounds: Normal breath sounds. No wheezing or rales.   Chest:     Chest wall: No tenderness.  Abdominal:     General: Bowel sounds are normal. There is no distension.     Palpations: Abdomen is soft. There is no mass.     Tenderness: There is no abdominal tenderness.  Musculoskeletal:        General: Normal range of motion.     Right lower leg: No edema.     Left lower leg: No edema.  Neurological:     Mental Status: He is alert and oriented to person, place, and time.  Psychiatric:        Mood and Affect: Mood normal.    CMP Latest Ref Rng & Units 09/20/2020 03/01/2020 10/25/2019  Glucose 65 - 99 mg/dL 88 81 75  BUN 10 - 36 mg/dL 14 17 17   Creatinine 0.76 - 1.27 mg/dL 0.88 1.09 0.95  Sodium 134 - 144 mmol/L 144 146(H) 143  Potassium 3.5 - 5.2 mmol/L 5.0 4.3 5.0  Chloride 96 - 106 mmol/L 104 106 106  CO2 20 - 29 mmol/L 24 25 26   Calcium 8.6 - 10.2 mg/dL 9.4 8.9 9.3  Total Protein 6.0 - 8.5 g/dL 6.9 - -  Total Bilirubin 0.0 - 1.2 mg/dL 0.9 - -  Alkaline Phos 44 - 121 IU/L 55 - -  AST 0 - 40 IU/L 13 - -  ALT 0 - 44 IU/L 9 - -    Lipid Panel     Component Value Date/Time   CHOL 186 04/28/2019 0941   TRIG 46 04/28/2019 0941   HDL 63 04/28/2019 0941   CHOLHDL 3.0 04/28/2019 0941   CHOLHDL 2.6 10/27/2015 0943   VLDL 10 10/27/2015 0943   LDLCALC 114 (H) 04/28/2019 0941    CBC    Component Value Date/Time   WBC 6.4 09/20/2020 1014   WBC 7.9 03/08/2019 0537   RBC 4.83 09/20/2020 1014   RBC 4.71 03/08/2019 0537   HGB 13.6 09/20/2020 1014   HCT 43.5 09/20/2020 1014   PLT 178 09/20/2020 1014   MCV 90 09/20/2020 1014   MCH 28.2 09/20/2020 1014   MCH 28.9 03/08/2019 0537   MCHC 31.3 (L) 09/20/2020 1014   MCHC 31.9 03/08/2019 0537   RDW 12.1 09/20/2020 1014   LYMPHSABS 2.4 09/20/2020 1014   MONOABS 0.9 03/08/2019 0537   EOSABS 0.2 09/20/2020 1014   BASOSABS 0.1 09/20/2020 1014    No results found for: HGBA1C  Assessment & Plan:  1. Essential hypertension Uncontrolled due to the fact that he is yet to take his  antihypertensive I have advised him to take his medications and I will make no regimen changes today He will follow-up with the clinical pharmacist to reassess his  blood pressure at his next visit and adjust regimen if indicated Counseled on blood pressure goal of less than 130/80, low-sodium, DASH diet, medication compliance, 150 minutes of moderate intensity exercise per week. Discussed medication compliance, adverse effects. - LP+Non-HDL Cholesterol - CMP14+EGFR - CBC with Differential/Platelet  2. Benign prostatic hyperplasia with urinary hesitancy Stable - tamsulosin (FLOMAX) 0.4 MG CAPS capsule; TAKE 1 CAPSULE(0.4 MG) BY MOUTH DAILY  Dispense: 90 capsule; Refill: 1   Meds ordered this encounter  Medications   tamsulosin (FLOMAX) 0.4 MG CAPS capsule    Sig: TAKE 1 CAPSULE(0.4 MG) BY MOUTH DAILY    Dispense:  90 capsule    Refill:  1    Follow-up: Return in about 2 weeks (around 05/16/2021) for Blood pressure follow-up with Lurena Joiner; 3 months with PCP.       Charlott Rakes, MD, FAAFP. [facility] [place], [place]   05/02/2021, 10:29 AM

## 2021-05-02 NOTE — Patient Instructions (Signed)

## 2021-05-03 LAB — CMP14+EGFR
ALT: 7 IU/L (ref 0–44)
AST: 11 IU/L (ref 0–40)
Albumin/Globulin Ratio: 1.6 (ref 1.2–2.2)
Albumin: 4.2 g/dL (ref 3.5–4.6)
Alkaline Phosphatase: 66 IU/L (ref 44–121)
BUN/Creatinine Ratio: 14 (ref 10–24)
BUN: 12 mg/dL (ref 10–36)
Bilirubin Total: 0.5 mg/dL (ref 0.0–1.2)
CO2: 23 mmol/L (ref 20–29)
Calcium: 9.2 mg/dL (ref 8.6–10.2)
Chloride: 106 mmol/L (ref 96–106)
Creatinine, Ser: 0.86 mg/dL (ref 0.76–1.27)
Globulin, Total: 2.6 g/dL (ref 1.5–4.5)
Glucose: 87 mg/dL (ref 70–99)
Potassium: 4.5 mmol/L (ref 3.5–5.2)
Sodium: 144 mmol/L (ref 134–144)
Total Protein: 6.8 g/dL (ref 6.0–8.5)
eGFR: 81 mL/min/{1.73_m2} (ref >59)

## 2021-05-03 LAB — CBC WITH DIFFERENTIAL/PLATELET
Basophils Absolute: 0.1 10*3/uL (ref 0.0–0.2)
Basos: 1 %
EOS (ABSOLUTE): 0.1 10*3/uL (ref 0.0–0.4)
Eos: 2 %
Hematocrit: 42.3 % (ref 37.5–51.0)
Hemoglobin: 13.5 g/dL (ref 13.0–17.7)
Immature Grans (Abs): 0 10*3/uL (ref 0.0–0.1)
Immature Granulocytes: 0 %
Lymphocytes Absolute: 2 10*3/uL (ref 0.7–3.1)
Lymphs: 34 %
MCH: 28.1 pg (ref 26.6–33.0)
MCHC: 31.9 g/dL (ref 31.5–35.7)
MCV: 88 fL (ref 79–97)
Monocytes Absolute: 0.6 10*3/uL (ref 0.1–0.9)
Monocytes: 10 %
Neutrophils Absolute: 3 10*3/uL (ref 1.4–7.0)
Neutrophils: 53 %
RBC: 4.8 x10E6/uL (ref 4.14–5.80)
RDW: 12.5 % (ref 11.6–15.4)
WBC: 5.8 10*3/uL (ref 3.4–10.8)

## 2021-05-03 LAB — LP+NON-HDL CHOLESTEROL
Cholesterol, Total: 164 mg/dL (ref 100–199)
HDL: 59 mg/dL (ref >39)
LDL Chol Calc (NIH): 96 mg/dL (ref 0–99)
Total Non-HDL-Chol (LDL+VLDL): 105 mg/dL (ref 0–129)
Triglycerides: 40 mg/dL (ref 0–149)
VLDL Cholesterol Cal: 9 mg/dL (ref 5–40)

## 2021-05-04 ENCOUNTER — Telehealth: Payer: Self-pay

## 2021-05-04 NOTE — Telephone Encounter (Signed)
-----   Message from Enobong Newlin, MD sent at 05/03/2021 10:53 AM EST ----- °Please inform the patient that labs are normal. Thank you. °

## 2021-05-04 NOTE — Telephone Encounter (Signed)
Pt was called and no VM is set up to leave a message.  Normal results letter has been mailed.

## 2021-05-17 ENCOUNTER — Ambulatory Visit: Payer: Medicare Other | Admitting: Pharmacist

## 2021-06-06 ENCOUNTER — Other Ambulatory Visit: Payer: Self-pay

## 2021-06-06 ENCOUNTER — Encounter: Payer: Self-pay | Admitting: Pharmacist

## 2021-06-06 ENCOUNTER — Ambulatory Visit: Payer: Medicare Other | Attending: Family Medicine | Admitting: Pharmacist

## 2021-06-06 VITALS — BP 188/62 | HR 51

## 2021-06-06 DIAGNOSIS — I10 Essential (primary) hypertension: Secondary | ICD-10-CM

## 2021-06-06 MED ORDER — HYDROCHLOROTHIAZIDE 12.5 MG PO TABS: 12.5000 mg | ORAL_TABLET | Freq: Every day | ORAL | 3 refills | Status: AC

## 2021-06-06 NOTE — Progress Notes (Signed)
S:     No chief complaint on file.   Jesus Morrison is a 88 y.o. male who presents for hypertension evaluation, education, and management. PMH is significant for HTN, BPH S/p retropubic prostatectomy, PE(02/2019), a fib. Patient was referred and last seen by Primary Care Provider, Dr. Alvis Lemmings, on 05/02/2021. BP at that visit was 166/64. He has not taken his BP medication prior to that visit.  Today, He arrives in good spirits and presents without assistance. Denies dizziness, headache, blurred vision, swelling.   Patient reports hypertension is longstanding.   Family/Social history:  Fhx: liver disease, heart disease   Tobacco: never smoker  Alcohol: endorses occasional use   Medication adherence reported. Patient has taken BP medications today. Upon review of his notes, he commonly presents to our clinic fasting and does not take his BP medications prior to being seen. This is why his BP is almost always elevated in clinic.   Current antihypertensives include: amlodipine 10 mg daily,  lisinopril 20 mg daily  Antihypertensives tried in the past include: metoprolol, carvedilol, hydralazine, HCTZ, combination lisinopril-HCTZ, combination valsartan-HCTZ  Reported home BP readings: none  Patient reported dietary habits:  - Drinks a couple of cups of coffee in the morning  - Compliant with salt restriction  - Denies eating fast food frequently  Patient-reported exercise habits: reports that he walks daily   O:  Physical Exam  ROS  Last 3 Office BP readings: BP Readings from Last 3 Encounters:  05/02/21 (!) 166/64  01/30/21 (!) 188/64  09/20/20 (!) 142/72    BMET    Component Value Date/Time   NA 144 05/02/2021 0951   K 4.5 05/02/2021 0951   CL 106 05/02/2021 0951   CO2 23 05/02/2021 0951   GLUCOSE 87 05/02/2021 0951   GLUCOSE 100 (H) 03/08/2019 0537   BUN 12 05/02/2021 0951   CREATININE 0.86 05/02/2021 0951   CREATININE 0.90 10/27/2015 0943   CALCIUM 9.2 05/02/2021  0951   GFRNONAA 59 (L) 03/01/2020 1445   GFRNONAA 77 10/27/2015 0943   GFRAA 68 03/01/2020 1445   GFRAA 88 10/27/2015 0943    Renal function: CrCl cannot be calculated (Patient's most recent lab result is older than the maximum 21 days allowed.).  Clinical ASCVD: No  The ASCVD Risk score (Arnett DK, et al., 2019) failed to calculate for the following reasons:   The 2019 ASCVD risk score is only valid for ages 76 to 43   A/P: Hypertension longstanding currently above goal on current medications. BP goal < 130/80 mmHg. Medication adherence appears appropriate and he has taken his BP today. Thiazides have utility in isolated systolic hypertension in elderly patients. Labs reviewed and his renal function and electrolytes are stable. No hx of gout or hypercalcemia. He has taken HCTZ before but this was removed in the past d/t concerns for dehydration. PT assures me he drinks plenty of water and denies any dysuria or polyuria at this time.  -Started HCTZ 12.5 mg daily. -Continue lisinopril and amlodipine at current doses.  -Patient educated on purpose, proper use, and potential adverse effects of HCTZ.  -F/u labs ordered - anticipate BMP at follow-up. -Counseled on lifestyle modifications for blood pressure control including reduced dietary sodium, increased exercise, adequate sleep. -Encouraged patient to check BP at home and bring log of readings to next visit. Counseled on proper use of home BP cuff.   Results reviewed and written information provided. Patient verbalized understanding of treatment plan. Total time in face-to-face counseling  15 minutes.   F/u clinic visit in 1 month.  Butch Penny, PharmD, [place], CPP Clinical Pharmacist [facility] [phone]

## 2021-07-05 ENCOUNTER — Encounter: Payer: Self-pay | Admitting: Pharmacist

## 2021-07-05 ENCOUNTER — Other Ambulatory Visit: Payer: Self-pay

## 2021-07-05 ENCOUNTER — Ambulatory Visit: Payer: Medicare Other | Attending: Family Medicine | Admitting: Pharmacist

## 2021-07-05 DIAGNOSIS — I480 Paroxysmal atrial fibrillation: Secondary | ICD-10-CM | POA: Diagnosis not present

## 2021-07-05 DIAGNOSIS — I10 Essential (primary) hypertension: Secondary | ICD-10-CM

## 2021-07-05 MED ORDER — ISOSORBIDE MONONITRATE ER 60 MG PO TB24: ORAL_TABLET | ORAL | 1 refills | Status: DC

## 2021-07-05 MED ORDER — AMLODIPINE BESYLATE 10 MG PO TABS: ORAL_TABLET | ORAL | 1 refills | Status: DC

## 2021-07-05 MED ORDER — APIXABAN 5 MG PO TABS: ORAL_TABLET | ORAL | 1 refills | Status: AC

## 2021-07-05 MED ORDER — LISINOPRIL 20 MG PO TABS: ORAL_TABLET | ORAL | 0 refills | Status: DC

## 2021-07-05 NOTE — Progress Notes (Signed)
? ?  S:    ? ?No chief complaint on file. ? ?Jesus Morrison is a 88 y.o. male who presents for hypertension evaluation, education, and management. PMH is significant for HTN, BPH S/p retropubic prostatectomy, PE(02/2019), a fib. Patient was referred and last seen by Primary Care Provider, Dr. Margarita Rana, on 05/02/2021. I saw him last month and his BP was elevated. I added low dose HCTZ. ? ?Today, he arrives in good spirits and presents without assistance. Denies dizziness, headache, blurred vision, swelling.  ? ?Patient reports hypertension is longstanding.  ? ?Family/Social history:  ?Fhx: liver disease, heart disease   ?Tobacco: never smoker  ?Alcohol: endorses occasional use  ? ?Medication adherence reported. Patient has taken BP medications today.  ? ?Current antihypertensives include: amlodipine 10 mg daily,  lisinopril 20 mg daily, HCTZ 12.5 mg daily ? ?Antihypertensives tried in the past include: metoprolol, carvedilol, hydralazine, HCTZ, combination lisinopril-HCTZ, combination valsartan-HCTZ ? ?Reported home BP readings: none ? ?Patient reported dietary habits:  ?- Drinks a couple of cups of coffee in the morning  ?- Compliant with salt restriction  ?- Denies eating fast food frequently ? ?Patient-reported exercise habits: reports that he walks daily  ? ?O:  ?Vitals:  ? 07/05/21 1110  ?BP: 132/62  ?Pulse: (!) 48  ? ?Last 3 Office BP readings: ?BP Readings from Last 3 Encounters:  ?07/05/21 132/62  ?06/06/21 (!) 188/62  ?05/02/21 (!) 166/64  ? ?BMET ?   ?Component Value Date/Time  ? NA 144 05/02/2021 0951  ? K 4.5 05/02/2021 0951  ? CL 106 05/02/2021 0951  ? CO2 23 05/02/2021 0951  ? GLUCOSE 87 05/02/2021 0951  ? GLUCOSE 100 (H) 03/08/2019 0537  ? BUN 12 05/02/2021 0951  ? CREATININE 0.86 05/02/2021 0951  ? CREATININE 0.90 10/27/2015 0943  ? CALCIUM 9.2 05/02/2021 0951  ? GFRNONAA 59 (L) 03/01/2020 1445  ? GFRNONAA 77 10/27/2015 0943  ? GFRAA 68 03/01/2020 1445  ? GFRAA 88 10/27/2015 0943  ? ?Renal function: ?CrCl  cannot be calculated (Patient's most recent lab result is older than the maximum 21 days allowed.). ? ?Clinical ASCVD: No  ?The ASCVD Risk score (Arnett DK, et al., 2019) failed to calculate for the following reasons: ?  The 2019 ASCVD risk score is only valid for ages 29 to 65 ? ?A/P: ?Hypertension longstanding currently at goal on current medications. BP goal < 130/80 mmHg. Medication adherence appears appropriate.  ?-Continue current medications.  ?-Anticipate BMP at PCP follow-up. ?-Counseled on lifestyle modifications for blood pressure control including reduced dietary sodium, increased exercise, adequate sleep. ?-Encouraged patient to check BP at home and bring log of readings to next visit. Counseled on proper use of home BP cuff.  ? ?Results reviewed and written information provided. Patient verbalized understanding of treatment plan. Total time in face-to-face counseling 15 minutes.  ? ?F/u clinic visit next month with PCP. ? ?Benard Halsted, PharmD, BCACP, CPP ?Clinical Pharmacist ?[place] ?[phone] ? ? ?

## 2021-07-31 ENCOUNTER — Encounter: Payer: Self-pay | Admitting: Family Medicine

## 2021-07-31 ENCOUNTER — Ambulatory Visit: Payer: Medicare Other | Attending: Family Medicine | Admitting: Family Medicine

## 2021-07-31 VITALS — BP 137/56 | HR 69 | Ht 65.0 in | Wt 167.2 lb

## 2021-07-31 DIAGNOSIS — I10 Essential (primary) hypertension: Secondary | ICD-10-CM | POA: Diagnosis not present

## 2021-07-31 DIAGNOSIS — N3943 Post-void dribbling: Secondary | ICD-10-CM | POA: Diagnosis not present

## 2021-07-31 DIAGNOSIS — I480 Paroxysmal atrial fibrillation: Secondary | ICD-10-CM | POA: Diagnosis not present

## 2021-07-31 DIAGNOSIS — N401 Enlarged prostate with lower urinary tract symptoms: Secondary | ICD-10-CM | POA: Diagnosis not present

## 2021-07-31 MED ORDER — ALFUZOSIN HCL ER 10 MG PO TB24: 10.0000 mg | ORAL_TABLET | Freq: Every day | ORAL | 1 refills | Status: AC

## 2021-07-31 NOTE — Progress Notes (Signed)
? ?Subjective:  ?Patient ID: Jesus Morrison, male    DOB: 07/29/1926  Age: 88 y.o. MRN: [id number] ? ?CC: Hypertension ? ? ?HPI ?Jesus BarmanGentry Gales is a 88 y.o. year old male with a history of hypertension, history of BPH status post retropubic prostatectomy in 04/2012, pulmonary embolism diagnosed in 02/2019, paroxysmal A. fib, V. tach in the setting of acute pulmonary thromboembolism who presents today for chronic disease management. ? ?Interval History: ?He complains of urinary frequency, nocturia, sense of incomplete voiding and has been on Tamsulosin. Denies presence of dysuria. He should be under the care of Urology as I had referred him previously however he does not recall this. ? ?He has been compliant with his antihypertensives. ?Denies presence of bleeding or bruising on Eliquis. ?He has no chest pain, dyspnea, chest pain. ? ?Denies additional concerns today ?Past Medical History:  ?Diagnosis Date  ? Arthritis   ? fingers and hands  ? BPH (benign prostatic hypertrophy)   ? problems with acute urinary retention- has indwelling foley catheter  ? Cold (disease)   ? last week ( week of Dec 22- Dec 29 ) - no longer coughing, no fever and feels better now  ? Enlarged prostate   ? Hypertension   ? ? ?Past Surgical History:  ?Procedure Laterality Date  ?  Right elbow surgery as a child    ? PROSTATECTOMY  04/17/2012  ? Procedure: PROSTATECTOMY RETROPUBIC;  Surgeon: Lindaann SloughMarc-Henry Nesi, MD;  Location: [facility];  Service: Urology;  Laterality: N/A;  Simple Retropubic Prostatectomy ? ?  ? ? ?Family History  ?Problem Relation Age of Onset  ? Liver disease Father   ? Heart disease Brother   ? ? ?Social History  ? ?Socioeconomic History  ? Marital status: Single  ?  Spouse name: Not on file  ? Number of children: Not on file  ? Years of education: Not on file  ? Highest education level: Not on file  ?Occupational History  ? Not on file  ?Tobacco Use  ? Smoking status: Never  ? Smokeless tobacco: Never  ?Substance and Sexual Activity  ?  Alcohol use: Yes  ? Drug use: No  ? Sexual activity: Not on file  ?Other Topics Concern  ? Not on file  ?Social History Narrative  ? Not on file  ? ?Social Determinants of Health  ? ?Financial Resource Strain: Not on file  ?Food Insecurity: Not on file  ?Transportation Needs: Not on file  ?Physical Activity: Not on file  ?Stress: Not on file  ?Social Connections: Not on file  ? ? ?No Known Allergies ? ?Outpatient Medications Prior to Visit  ?Medication Sig Dispense Refill  ? amLODipine (NORVASC) 10 MG tablet TAKE 1 TABLET(10 MG) BY MOUTH DAILY 90 tablet 1  ? apixaban (ELIQUIS) 5 MG TABS tablet TAKE 1 TABLET(5 MG) BY MOUTH TWICE DAILY 180 tablet 1  ? cetirizine (ZYRTEC) 10 MG tablet Take 1 tablet (10 mg total) by mouth daily. 30 tablet 1  ? hydrochlorothiazide (HYDRODIURIL) 12.5 MG tablet Take 1 tablet (12.5 mg total) by mouth daily. 90 tablet 3  ? hydrocortisone cream 0.5 % Apply 1 application topically 2 (two) times daily. 30 g 1  ? isosorbide mononitrate (IMDUR) 60 MG 24 hr tablet TAKE 1 TABLET(60 MG) BY MOUTH DAILY 90 tablet 1  ? lisinopril (ZESTRIL) 20 MG tablet TAKE 1 TABLET(20 MG) BY MOUTH DAILY 90 tablet 0  ? polyethylene glycol (MIRALAX / GLYCOLAX) 17 g packet Take 17 g by mouth daily. Please  stop if you have diarrhea. 30 each 3  ? triamcinolone cream (KENALOG) 0.1 % Apply 1 application topically 2 (two) times daily. 30 g 1  ? tamsulosin (FLOMAX) 0.4 MG CAPS capsule TAKE 1 CAPSULE(0.4 MG) BY MOUTH DAILY 90 capsule 1  ? ?No facility-administered medications prior to visit.  ? ? ? ?ROS ?Review of Systems  ?Constitutional:  Negative for activity change and appetite change.  ?HENT:  Negative for sinus pressure and sore throat.   ?Respiratory:  Negative for chest tightness, shortness of breath and wheezing.   ?Cardiovascular:  Negative for chest pain and palpitations.  ?Gastrointestinal:  Negative for abdominal distention, abdominal pain and constipation.  ?Genitourinary:  Positive for frequency.   ?Musculoskeletal: Negative.   ?Psychiatric/Behavioral:  Negative for behavioral problems and dysphoric mood.   ? ?Objective:  ?BP (!) 137/56   Pulse 69   Ht 5\' 5"  (1.651 m)   Wt 167 lb 3.2 oz (75.8 kg)   SpO2 98%   BMI 27.82 kg/m?  ? ? ?  07/31/2021  ?  9:21 AM 07/31/2021  ?  8:58 AM 07/05/2021  ? 11:10 AM  ?BP/Weight  ?Systolic BP 137 152 132  ?Diastolic BP 56 52 62  ?Wt. (Lbs)  167.2   ?BMI  27.82 kg/m2   ? ? ? ? ?Physical Exam ?Constitutional:   ?   Appearance: He is well-developed.  ?Cardiovascular:  ?   Rate and Rhythm: Normal rate.  ?   Heart sounds: Murmur (1/6 systolic) heard.  ?Pulmonary:  ?   Effort: Pulmonary effort is normal.  ?   Breath sounds: Normal breath sounds. No wheezing or rales.  ?Chest:  ?   Chest wall: No tenderness.  ?Abdominal:  ?   General: Bowel sounds are normal. There is no distension.  ?   Palpations: Abdomen is soft. There is no mass.  ?   Tenderness: There is no abdominal tenderness.  ?Musculoskeletal:     ?   General: Normal range of motion.  ?   Right lower leg: No edema.  ?   Left lower leg: No edema.  ?Neurological:  ?   Mental Status: He is alert and oriented to person, place, and time.  ?Psychiatric:     ?   Mood and Affect: Mood normal.  ? ? ? ?  Latest Ref Rng & Units 05/02/2021  ?  9:51 AM 09/20/2020  ? 10:14 AM 03/01/2020  ?  2:45 PM  ?CMP  ?Glucose 70 - 99 mg/dL 87   88   81    ?BUN 10 - 36 mg/dL 12   14   17     ?Creatinine 0.76 - 1.27 mg/dL 03/03/2020     2.70    ?Sodium 134 - 144 mmol/L 144   144   146    ?Potassium 3.5 - 5.2 mmol/L 4.5   5.0   4.3    ?Chloride 96 - 106 mmol/L 106   104   106    ?CO2 20 - 29 mmol/L 23   24   25     ?Calcium 8.6 - 10.2 mg/dL 9.2   9.4   8.9    ?Total Protein 6.0 - 8.5 g/dL 6.8   6.9     ?Total Bilirubin 0.0 - 1.2 mg/dL 0.5   0.9     ?Alkaline Phos 44 - 121 IU/L 66   55     ?AST 0 - 40 IU/L 11   13     ?ALT  0 - 44 IU/L 7   9     ? ? ?Lipid Panel  ?   ?Component Value Date/Time  ? CHOL 164 05/02/2021 0951  ? TRIG 40 05/02/2021 0951  ? HDL 59  05/02/2021 0951  ? CHOLHDL 3.0 04/28/2019 0941  ? CHOLHDL 2.6 10/27/2015 0943  ? VLDL 10 10/27/2015 0943  ? LDLCALC 96 05/02/2021 0951  ? ? ?CBC ?   ?Component Value Date/Time  ? WBC 5.8 05/02/2021 0951  ? WBC 7.9 03/08/2019 0537  ? RBC 4.80 05/02/2021 0951  ? RBC 4.71 03/08/2019 0537  ? HGB 13.5 05/02/2021 0951  ? HCT 42.3 05/02/2021 0951  ? PLT CANCELED 05/02/2021 0951  ? MCV 88 05/02/2021 0951  ? MCH 28.1 05/02/2021 0951  ? MCH 28.9 03/08/2019 0537  ? MCHC 31.9 05/02/2021 0951  ? MCHC 31.9 03/08/2019 0537  ? RDW 12.5 05/02/2021 0951  ? LYMPHSABS 2.0 05/02/2021 0951  ? MONOABS 0.9 03/08/2019 0537  ? EOSABS 0.1 05/02/2021 0951  ? BASOSABS 0.1 05/02/2021 0951  ? ? ?No results found for: HGBA1C ? ?Assessment & Plan:  ?1. Essential hypertension ?He does have a low diastolic BP but is asymptomatic ?No regimen change today but he knows to notify the Clinic with any new concerns ?Continue current regimen ?Counseled on blood pressure goal of less than 130/80, low-sodium, DASH diet, medication compliance, 150 minutes of moderate intensity exercise per week. ?Discussed medication compliance, adverse effects. ? ?2. Paroxysmal atrial fibrillation (HCC) ?Currently in sinus rhythm ?Continue anticoagulation with Eliquis ? ?3. Benign prostatic hyperplasia with post-void dribbling ?Uncontrolled ?We will switch from tamsulosin to alfuzosin ?I have provided him with the number to the urology clinic to schedule an appointment. ?- alfuzosin (UROXATRAL) 10 MG 24 hr tablet; Take 1 tablet (10 mg total) by mouth daily with breakfast.  Dispense: 90 tablet; Refill: 1 ? ? ? ?Meds ordered this encounter  ?Medications  ? alfuzosin (UROXATRAL) 10 MG 24 hr tablet  ?  Sig: Take 1 tablet (10 mg total) by mouth daily with breakfast.  ?  Dispense:  90 tablet  ?  Refill:  1  ?  Discontinue tamsulosin  ? ? ?Follow-up: Return in about 3 months (around 10/30/2021) for Chronic medical conditions.  ? ? ? ? ? ?Hoy Register, MD, FAAFP. ?  [facility] ?[place], [place] ?[phone]   ?07/31/2021, 12:44 PM ?

## 2021-07-31 NOTE — Patient Instructions (Signed)
Please call your Urologist for an appointment -  ?[facility] ph. # [phone].Address [place] ?

## 2021-10-05 ENCOUNTER — Telehealth: Payer: Self-pay | Admitting: Family Medicine

## 2021-10-05 NOTE — Telephone Encounter (Signed)
Please call patient and inquire about which medication he is needing.

## 2021-10-15 ENCOUNTER — Other Ambulatory Visit: Payer: Self-pay

## 2021-10-15 ENCOUNTER — Telehealth: Payer: Self-pay

## 2021-10-15 DIAGNOSIS — I10 Essential (primary) hypertension: Secondary | ICD-10-CM

## 2021-10-15 MED ORDER — LISINOPRIL 20 MG PO TABS: ORAL_TABLET | ORAL | 3 refills | Status: AC

## 2021-10-15 NOTE — Telephone Encounter (Signed)
The patient came to the clinic today requesting refills of his medications but he was not sure which medications he needed,  He had many empty bottles and had 2 medication planners with only a few days filled and not filled with all of his medications.   The patient was agreeable to having meds placed in bubble packs but preferred to keep his current pharmacy - [facility]. He said he did not need delivery because he can drive to the pharmacy and picks up his own meds.   I called [facility] and spoke Weston Brass who said that the patient has refills for alfuzosin, amlodipine, eliquis and imdur and he will refill those and contact the patient. Refill is needed for lisinopril and Harle Stanford, CMA was notified. Weston Brass also stated that the patient had a 90 day supply of hydrochlorothiazide filled on 09/20/2021.  [facility] does not bubble pack medications.   I was not able to reach the patient or leave a message on either number : [phone] and [phone] to inform him that [facility] doesn't bubble pack.  The option of changing pharmacies can be discussed with patient at his upcoming appointment at [facility] later this month.

## 2021-10-15 NOTE — Telephone Encounter (Signed)
Noted  

## 2021-11-07 ENCOUNTER — Ambulatory Visit: Payer: Medicare Other | Admitting: Family Medicine

## 2021-11-08 ENCOUNTER — Other Ambulatory Visit: Payer: Self-pay | Admitting: Family Medicine

## 2021-11-08 DIAGNOSIS — N401 Enlarged prostate with lower urinary tract symptoms: Secondary | ICD-10-CM

## 2021-11-13 ENCOUNTER — Other Ambulatory Visit: Payer: Self-pay | Admitting: Family Medicine

## 2021-11-13 DIAGNOSIS — I10 Essential (primary) hypertension: Secondary | ICD-10-CM

## 2022-02-01 ENCOUNTER — Other Ambulatory Visit: Payer: Self-pay | Admitting: Family Medicine

## 2022-02-01 DIAGNOSIS — I10 Essential (primary) hypertension: Secondary | ICD-10-CM

## 2022-05-08 ENCOUNTER — Ambulatory Visit: Payer: Medicare Other | Admitting: Family Medicine

## 2022-07-02 ENCOUNTER — Telehealth: Payer: Self-pay | Admitting: Family Medicine

## 2022-07-02 NOTE — Telephone Encounter (Signed)
Called patient to schedule Medicare Annual Wellness Visit (AWV). No voicemail available to leave a message.  Last date of AWV: due awvi 04/15/09 per [facility]     If any questions, please contact me at [phone].  Thank you ,  Barkley Boards AWV direct phone # [phone]

## 2022-08-08 ENCOUNTER — Telehealth: Payer: Self-pay | Admitting: Family Medicine

## 2022-08-08 NOTE — Telephone Encounter (Signed)
Called patient to schedule Medicare Annual Wellness Visit (AWV). Left message for patient to call back and schedule Medicare Annual Wellness Visit (AWV).  Last date of AWV: awvi 04/15/09 per [facility]     If any questions, please contact me at [phone].  Thank you ,  Jarod Bozzo [facility] AWV direct phone # [phone]
